# Patient Record
Sex: Female | Born: 1950 | Race: Black or African American | Hispanic: No | Smoking: Former smoker
Health system: Southern US, Community
[De-identification: ages and names within clinical notes are randomized; demographics above are authoritative.]

## PROBLEM LIST (undated history)

## (undated) DIAGNOSIS — D649 Anemia, unspecified: Secondary | ICD-10-CM

## (undated) DIAGNOSIS — E785 Hyperlipidemia, unspecified: Secondary | ICD-10-CM

## (undated) DIAGNOSIS — B1920 Unspecified viral hepatitis C without hepatic coma: Secondary | ICD-10-CM

## (undated) DIAGNOSIS — K219 Gastro-esophageal reflux disease without esophagitis: Secondary | ICD-10-CM

## (undated) DIAGNOSIS — R002 Palpitations: Secondary | ICD-10-CM

## (undated) DIAGNOSIS — I422 Other hypertrophic cardiomyopathy: Secondary | ICD-10-CM

## (undated) DIAGNOSIS — M6530 Trigger finger, unspecified finger: Secondary | ICD-10-CM

## (undated) DIAGNOSIS — T7840XA Allergy, unspecified, initial encounter: Secondary | ICD-10-CM

## (undated) DIAGNOSIS — K5790 Diverticulosis of intestine, part unspecified, without perforation or abscess without bleeding: Secondary | ICD-10-CM

## (undated) HISTORY — DX: Unspecified viral hepatitis C without hepatic coma: B19.20

## (undated) HISTORY — PX: COLONOSCOPY: SHX174

## (undated) HISTORY — DX: Diverticulosis of intestine, part unspecified, without perforation or abscess without bleeding: K57.90

## (undated) HISTORY — DX: Anemia, unspecified: D64.9

## (undated) HISTORY — PX: BREAST SURGERY: SHX581

## (undated) HISTORY — PX: UPPER GASTROINTESTINAL ENDOSCOPY: SHX188

## (undated) HISTORY — DX: Other hypertrophic cardiomyopathy: I42.2

## (undated) HISTORY — PX: FOOT GANGLION EXCISION: SHX1660

## (undated) HISTORY — DX: Palpitations: R00.2

## (undated) HISTORY — DX: Hyperlipidemia, unspecified: E78.5

## (undated) HISTORY — PX: POLYPECTOMY: SHX149

## (undated) HISTORY — DX: Allergy, unspecified, initial encounter: T78.40XA

---

## 1978-07-08 DIAGNOSIS — F1910 Other psychoactive substance abuse, uncomplicated: Secondary | ICD-10-CM | POA: Insufficient documentation

## 1978-07-08 HISTORY — PX: OOPHORECTOMY: SHX86

## 1978-07-08 HISTORY — PX: HEMORRHOID SURGERY: SHX153

## 1978-07-08 HISTORY — PX: OVARIAN CYST DRAINAGE: SHX325

## 1992-07-08 DIAGNOSIS — R42 Dizziness and giddiness: Secondary | ICD-10-CM | POA: Insufficient documentation

## 1994-07-08 HISTORY — PX: LACERATION REPAIR: SHX5168

## 1998-07-08 HISTORY — PX: OOPHORECTOMY: SHX86

## 1999-07-09 HISTORY — PX: BREAST REDUCTION SURGERY: SHX8

## 1999-10-09 HISTORY — PX: MYOMECTOMY: SHX85

## 2000-07-08 HISTORY — PX: PARTIAL HYSTERECTOMY: SHX80

## 2005-06-11 ENCOUNTER — Ambulatory Visit: Payer: Self-pay | Admitting: Family Medicine

## 2005-06-28 ENCOUNTER — Ambulatory Visit: Payer: Self-pay | Admitting: Family Medicine

## 2005-08-13 ENCOUNTER — Ambulatory Visit: Payer: Self-pay | Admitting: Family Medicine

## 2005-08-14 ENCOUNTER — Ambulatory Visit: Payer: Self-pay | Admitting: Family Medicine

## 2005-10-15 ENCOUNTER — Ambulatory Visit: Payer: Self-pay | Admitting: Family Medicine

## 2005-11-17 ENCOUNTER — Emergency Department: Payer: Self-pay | Admitting: Emergency Medicine

## 2005-11-18 ENCOUNTER — Ambulatory Visit: Payer: Self-pay | Admitting: Family Medicine

## 2005-11-21 ENCOUNTER — Ambulatory Visit: Payer: Self-pay | Admitting: Gastroenterology

## 2005-12-06 ENCOUNTER — Encounter: Payer: Self-pay | Admitting: Family Medicine

## 2005-12-06 DIAGNOSIS — R739 Hyperglycemia, unspecified: Secondary | ICD-10-CM | POA: Insufficient documentation

## 2005-12-06 DIAGNOSIS — I10 Essential (primary) hypertension: Secondary | ICD-10-CM | POA: Insufficient documentation

## 2005-12-06 LAB — CONVERTED CEMR LAB: Pap Smear: NORMAL

## 2005-12-12 ENCOUNTER — Ambulatory Visit: Payer: Self-pay | Admitting: Family Medicine

## 2005-12-17 ENCOUNTER — Other Ambulatory Visit: Admission: RE | Admit: 2005-12-17 | Discharge: 2005-12-17 | Payer: Self-pay | Admitting: Family Medicine

## 2005-12-17 ENCOUNTER — Ambulatory Visit: Payer: Self-pay | Admitting: Family Medicine

## 2005-12-17 ENCOUNTER — Encounter: Payer: Self-pay | Admitting: Family Medicine

## 2006-01-01 ENCOUNTER — Ambulatory Visit: Payer: Self-pay | Admitting: Internal Medicine

## 2006-02-25 ENCOUNTER — Ambulatory Visit: Payer: Self-pay | Admitting: Family Medicine

## 2006-05-22 ENCOUNTER — Ambulatory Visit: Payer: Self-pay | Admitting: Family Medicine

## 2006-06-09 ENCOUNTER — Ambulatory Visit: Payer: Self-pay | Admitting: Gastroenterology

## 2006-06-16 ENCOUNTER — Ambulatory Visit: Payer: Self-pay | Admitting: Family Medicine

## 2006-10-07 ENCOUNTER — Encounter: Payer: Self-pay | Admitting: Family Medicine

## 2006-10-07 DIAGNOSIS — E7800 Pure hypercholesterolemia, unspecified: Secondary | ICD-10-CM | POA: Insufficient documentation

## 2006-10-22 ENCOUNTER — Ambulatory Visit: Payer: Self-pay | Admitting: Family Medicine

## 2006-12-10 ENCOUNTER — Ambulatory Visit: Payer: Self-pay | Admitting: Gastroenterology

## 2007-01-28 ENCOUNTER — Encounter (INDEPENDENT_AMBULATORY_CARE_PROVIDER_SITE_OTHER): Payer: Self-pay | Admitting: *Deleted

## 2007-01-29 ENCOUNTER — Ambulatory Visit: Payer: Self-pay | Admitting: Family Medicine

## 2007-01-29 LAB — CONVERTED CEMR LAB
ALT: 66 units/L — ABNORMAL HIGH (ref 0–35)
AST: 56 units/L — ABNORMAL HIGH (ref 0–37)
Albumin: 4 g/dL (ref 3.5–5.2)
Alkaline Phosphatase: 68 units/L (ref 39–117)
Bilirubin, Direct: 0.1 mg/dL (ref 0.0–0.3)
Calcium: 9.7 mg/dL (ref 8.4–10.5)
Chloride: 103 meq/L (ref 96–112)
Cholesterol: 174 mg/dL (ref 0–200)
GFR calc non Af Amer: 92 mL/min
Glucose, Bld: 109 mg/dL — ABNORMAL HIGH (ref 70–99)
LDL Cholesterol: 125 mg/dL — ABNORMAL HIGH (ref 0–99)
TSH: 0.54 microintl units/mL (ref 0.35–5.50)

## 2007-02-02 ENCOUNTER — Encounter: Payer: Self-pay | Admitting: Family Medicine

## 2007-02-02 ENCOUNTER — Ambulatory Visit: Payer: Self-pay | Admitting: Family Medicine

## 2007-02-03 ENCOUNTER — Encounter: Admission: RE | Admit: 2007-02-03 | Discharge: 2007-02-03 | Payer: Self-pay | Admitting: Family Medicine

## 2007-02-20 ENCOUNTER — Ambulatory Visit: Payer: Self-pay | Admitting: Family Medicine

## 2007-02-23 ENCOUNTER — Encounter: Admission: RE | Admit: 2007-02-23 | Discharge: 2007-02-23 | Payer: Self-pay | Admitting: Family Medicine

## 2007-02-24 ENCOUNTER — Ambulatory Visit: Payer: Self-pay | Admitting: Family Medicine

## 2007-02-25 ENCOUNTER — Telehealth: Payer: Self-pay | Admitting: Family Medicine

## 2007-03-16 ENCOUNTER — Ambulatory Visit: Payer: Self-pay | Admitting: Gastroenterology

## 2007-03-16 ENCOUNTER — Encounter: Payer: Self-pay | Admitting: Family Medicine

## 2007-03-19 ENCOUNTER — Ambulatory Visit: Payer: Self-pay | Admitting: Family Medicine

## 2007-03-20 ENCOUNTER — Encounter (INDEPENDENT_AMBULATORY_CARE_PROVIDER_SITE_OTHER): Payer: Self-pay | Admitting: *Deleted

## 2007-07-24 ENCOUNTER — Telehealth: Payer: Self-pay | Admitting: Family Medicine

## 2007-08-03 ENCOUNTER — Ambulatory Visit: Payer: Self-pay | Admitting: Family Medicine

## 2007-08-03 DIAGNOSIS — R002 Palpitations: Secondary | ICD-10-CM

## 2007-08-04 ENCOUNTER — Ambulatory Visit: Payer: Self-pay | Admitting: Cardiology

## 2007-08-11 ENCOUNTER — Ambulatory Visit: Payer: Self-pay | Admitting: Family Medicine

## 2007-08-12 ENCOUNTER — Encounter: Admission: RE | Admit: 2007-08-12 | Discharge: 2007-08-12 | Payer: Self-pay | Admitting: Family Medicine

## 2007-08-12 ENCOUNTER — Ambulatory Visit: Payer: Self-pay | Admitting: Cardiology

## 2007-08-12 ENCOUNTER — Encounter: Payer: Self-pay | Admitting: Cardiology

## 2007-08-28 ENCOUNTER — Ambulatory Visit: Payer: Self-pay

## 2007-08-28 ENCOUNTER — Ambulatory Visit: Payer: Self-pay | Admitting: Cardiology

## 2008-03-01 ENCOUNTER — Encounter: Admission: RE | Admit: 2008-03-01 | Discharge: 2008-03-01 | Payer: Self-pay | Admitting: Family Medicine

## 2008-03-02 ENCOUNTER — Encounter (INDEPENDENT_AMBULATORY_CARE_PROVIDER_SITE_OTHER): Payer: Self-pay | Admitting: *Deleted

## 2008-03-02 ENCOUNTER — Ambulatory Visit: Payer: Self-pay | Admitting: Cardiology

## 2008-04-21 ENCOUNTER — Encounter: Payer: Self-pay | Admitting: Family Medicine

## 2008-04-21 ENCOUNTER — Other Ambulatory Visit: Admission: RE | Admit: 2008-04-21 | Discharge: 2008-04-21 | Payer: Self-pay | Admitting: Family Medicine

## 2008-04-21 ENCOUNTER — Ambulatory Visit: Payer: Self-pay | Admitting: Family Medicine

## 2008-04-21 LAB — CONVERTED CEMR LAB
AST: 38 units/L — ABNORMAL HIGH (ref 0–37)
Alkaline Phosphatase: 72 units/L (ref 39–117)
Basophils Absolute: 0 10*3/uL (ref 0.0–0.1)
Chloride: 105 meq/L (ref 96–112)
Cholesterol: 159 mg/dL (ref 0–200)
Creatinine,U: 81 mg/dL
Eosinophils Absolute: 0.1 10*3/uL (ref 0.0–0.7)
GFR calc Af Amer: 132 mL/min
GFR calc non Af Amer: 109 mL/min
HDL: 36.2 mg/dL — ABNORMAL LOW (ref >39.0)
MCHC: 33.9 g/dL (ref 30.0–36.0)
MCV: 86 fL (ref 78.0–100.0)
Microalb Creat Ratio: 7.4 mg/g (ref 0.0–30.0)
Monocytes Absolute: 0.3 10*3/uL (ref 0.1–1.0)
Neutrophils Relative %: 41.7 % — ABNORMAL LOW (ref 43.0–77.0)
Platelets: 202 10*3/uL (ref 150–400)
Potassium: 4.2 meq/L (ref 3.5–5.1)
RDW: 12.7 % (ref 11.5–14.6)
TSH: 0.91 microintl units/mL (ref 0.35–5.50)
Total Bilirubin: 0.9 mg/dL (ref 0.3–1.2)
Triglycerides: 100 mg/dL (ref 0–149)
VLDL: 20 mg/dL (ref 0–40)

## 2008-04-26 ENCOUNTER — Encounter (INDEPENDENT_AMBULATORY_CARE_PROVIDER_SITE_OTHER): Payer: Self-pay | Admitting: *Deleted

## 2008-05-04 ENCOUNTER — Ambulatory Visit: Payer: Self-pay | Admitting: Family Medicine

## 2008-05-04 ENCOUNTER — Encounter (INDEPENDENT_AMBULATORY_CARE_PROVIDER_SITE_OTHER): Payer: Self-pay | Admitting: *Deleted

## 2008-05-04 LAB — CONVERTED CEMR LAB
OCCULT 1: NEGATIVE
OCCULT 3: NEGATIVE

## 2008-09-19 ENCOUNTER — Ambulatory Visit: Payer: Self-pay | Admitting: Family Medicine

## 2008-09-19 DIAGNOSIS — R209 Unspecified disturbances of skin sensation: Secondary | ICD-10-CM

## 2008-10-24 ENCOUNTER — Ambulatory Visit: Payer: Self-pay | Admitting: Family Medicine

## 2008-10-24 DIAGNOSIS — M545 Low back pain: Secondary | ICD-10-CM

## 2008-10-24 DIAGNOSIS — K644 Residual hemorrhoidal skin tags: Secondary | ICD-10-CM

## 2008-10-27 ENCOUNTER — Encounter: Payer: Self-pay | Admitting: Family Medicine

## 2008-10-27 ENCOUNTER — Telehealth: Payer: Self-pay | Admitting: Family Medicine

## 2009-01-25 ENCOUNTER — Encounter (INDEPENDENT_AMBULATORY_CARE_PROVIDER_SITE_OTHER): Payer: Self-pay | Admitting: *Deleted

## 2009-03-02 ENCOUNTER — Ambulatory Visit: Payer: Self-pay | Admitting: Family Medicine

## 2009-03-02 DIAGNOSIS — K5732 Diverticulitis of large intestine without perforation or abscess without bleeding: Secondary | ICD-10-CM

## 2009-03-02 LAB — CONVERTED CEMR LAB
Glucose, Urine, Semiquant: NEGATIVE
Nitrite: NEGATIVE
Protein, U semiquant: NEGATIVE
Specific Gravity, Urine: 1.01
WBC Urine, dipstick: NEGATIVE
pH: 5

## 2009-03-06 ENCOUNTER — Telehealth: Payer: Self-pay | Admitting: Family Medicine

## 2009-05-02 ENCOUNTER — Telehealth: Payer: Self-pay | Admitting: Cardiology

## 2009-05-03 ENCOUNTER — Ambulatory Visit: Payer: Self-pay | Admitting: Cardiology

## 2009-05-03 DIAGNOSIS — I517 Cardiomegaly: Secondary | ICD-10-CM | POA: Insufficient documentation

## 2009-05-10 ENCOUNTER — Ambulatory Visit: Payer: Self-pay | Admitting: Family Medicine

## 2009-05-10 DIAGNOSIS — T7840XA Allergy, unspecified, initial encounter: Secondary | ICD-10-CM | POA: Insufficient documentation

## 2009-06-19 ENCOUNTER — Encounter: Admission: RE | Admit: 2009-06-19 | Discharge: 2009-06-19 | Payer: Self-pay | Admitting: Family Medicine

## 2009-08-24 ENCOUNTER — Telehealth (INDEPENDENT_AMBULATORY_CARE_PROVIDER_SITE_OTHER): Payer: Self-pay | Admitting: *Deleted

## 2009-09-13 ENCOUNTER — Ambulatory Visit: Payer: Self-pay | Admitting: Family Medicine

## 2009-09-13 DIAGNOSIS — R259 Unspecified abnormal involuntary movements: Secondary | ICD-10-CM | POA: Insufficient documentation

## 2009-11-06 ENCOUNTER — Ambulatory Visit: Payer: Self-pay | Admitting: Family Medicine

## 2009-11-06 DIAGNOSIS — J069 Acute upper respiratory infection, unspecified: Secondary | ICD-10-CM | POA: Insufficient documentation

## 2010-02-13 ENCOUNTER — Encounter (INDEPENDENT_AMBULATORY_CARE_PROVIDER_SITE_OTHER): Payer: Self-pay | Admitting: *Deleted

## 2010-04-13 ENCOUNTER — Ambulatory Visit: Payer: Self-pay | Admitting: Family Medicine

## 2010-04-13 DIAGNOSIS — H1045 Other chronic allergic conjunctivitis: Secondary | ICD-10-CM | POA: Insufficient documentation

## 2010-04-23 ENCOUNTER — Telehealth (INDEPENDENT_AMBULATORY_CARE_PROVIDER_SITE_OTHER): Payer: Self-pay

## 2010-06-15 ENCOUNTER — Encounter (INDEPENDENT_AMBULATORY_CARE_PROVIDER_SITE_OTHER): Payer: Self-pay | Admitting: *Deleted

## 2010-06-29 ENCOUNTER — Ambulatory Visit: Payer: Self-pay | Admitting: Physician Assistant

## 2010-07-19 ENCOUNTER — Encounter (INDEPENDENT_AMBULATORY_CARE_PROVIDER_SITE_OTHER): Payer: Self-pay | Admitting: *Deleted

## 2010-07-20 ENCOUNTER — Ambulatory Visit
Admission: RE | Admit: 2010-07-20 | Discharge: 2010-07-20 | Payer: Self-pay | Source: Home / Self Care | Attending: Internal Medicine | Admitting: Internal Medicine

## 2010-07-29 ENCOUNTER — Encounter: Payer: Self-pay | Admitting: Family Medicine

## 2010-08-03 ENCOUNTER — Ambulatory Visit
Admission: RE | Admit: 2010-08-03 | Discharge: 2010-08-03 | Payer: Self-pay | Source: Home / Self Care | Attending: Internal Medicine | Admitting: Internal Medicine

## 2010-08-03 ENCOUNTER — Encounter: Payer: Self-pay | Admitting: Internal Medicine

## 2010-08-03 LAB — HM COLONOSCOPY

## 2010-08-07 NOTE — Assessment & Plan Note (Signed)
Summary: SPASMS IN NECK   Vital Signs:  Patient profile:   60 year old female Weight:      190.75 pounds Temp:     98.5 degrees F oral Pulse rate:   72 / minute Pulse rhythm:   regular BP sitting:   128 / 82  (left arm) Cuff size:   regular  Vitals Entered By: Sydell Axon LPN (September 13, 1608 10:50 AM) CC: Spasms in left side of neck   History of Present Illness: [name] is a 60 y/o [place] American female who presents with a 2 day history of episodic spasms in her left neck.  She describes the spasms as "twitches" that last for a few seconds.  They occur randomly and she doesn't recall anything that initiates the twitching.  Likewise, they resolve spontaneously.  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  Her only other medical complaint is the inability to maintain weight loss.  She states that she fluctuates between gaining an dlosing 10 lbs.  Her weight loss regimen consists of walking 2 miles every morning but she professes to eating too much and to having too many snacks.  She would like to be more dedicated.    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 Take One By Mouth Once A Day 2)  Benadryl 25 Mg Caps (Diphenhydramine Hcl) .... As Needed  Allergies: 1)  Penicillin G Potassium (Penicillin G Potassium) 2)  Morphine Sulfate (Morphine Sulfate)  Physical Exam  General:  Well-developed,well-nourished,in no acute distress; alert,appropriate and cooperative throughout examination Head:  Normocephalic and atraumatic without obvious abnormalities. No apparent alopecia or balding. Eyes:  Conjunctiva clear bilaterally. Noninflamed. Ears:  External ear exam shows no significant lesions or deformities.  Otoscopic examination reveals clear canals, tympanic membranes are intact bilaterally without bulging, retraction, inflammation or discharge. Hearing is grossly normal bilaterally. Nose:  External nasal examination shows no deformity or inflammation. Nasal mucosa are pink and moist without lesions or exudates. Mouth:  Oral mucosa and oropharynx without lesions or exudates.  Teeth in good repair. Neck:  No deformities, masses, or tenderness noted.  No carotid bruits or JVD noted.  Carotid pulses 2+ with normal rate. Lungs:  Normal respiratory effort, chest expands symmetrically. Lungs are clear to auscultation, no crackles or wheezes. Heart:  Normal rate and regular rhythm. S1 and S2 normal without gallop, murmur, click, rub or other extra sounds. Msk:  Occas twitching of left neck muscles.   Impression & Recommendations:  Problem # 1:  MUSCLE FASCICULATIONS (ICD-781.0) Assessment New Take 2 Tbsp mustard regularly. Move computer screen to horizontal with eyes.  Problem # 2:  PALPITATIONS (ICD-785.1) Assessment: Unchanged  Stable, NSR today.  Avoid caffeine, chocolate, and stimulants. Stress reduction as well as medication options discussed.   Problem # 3:  HYPERTENSION (ICD-401.9) Assessment: Unchanged Stable. Her updated medication list for this problem includes:    Verapamil  Hcl Cr 240 Mg Tbcr (Verapamil hcl) .Marland Kitchen... Take one by mouth once a day  BP today: 128/82 Prior BP: 114/80 (05/10/2009)  Labs Reviewed: K+: 4.2 (04/21/2008) Creat: : 0.6 (04/21/2008)   Chol: 159 (04/21/2008)   HDL: 36.2 (04/21/2008)   LDL: 103 (04/21/2008)   TG: 100 (04/21/2008)  Complete Medication List: 1)  Verapamil Hcl Cr 240 Mg Tbcr (Verapamil hcl) .... Take one by mouth once a day 2)  Benadryl 25 Mg Caps (Diphenhydramine hcl) .... As needed  Patient Instructions: 1)  RTC if sxs cont.  Current Allergies (reviewed today): PENICILLIN G POTASSIUM (PENICILLIN G POTASSIUM) MORPHINE SULFATE (MORPHINE SULFATE)

## 2010-08-07 NOTE — Letter (Signed)
Summary: Pre Visit Letter Revised  [facility]  [address] [place], [place] [address]   Phone: [phone]  Fax: [phone]        06/15/2010 MRN: [id number] [name] [address] [place], [place]  [address]             Procedure Date:  08/03/2010   Welcome to the Gastroenterology Division at [facility].    You are scheduled to see a nurse for your pre-procedure visit on 07/20/2010 at 11:00AM on the 3rd floor at [facility], [address]  We ask that you try to arrive at our office 15 minutes prior to your appointment time to allow for check-in.  Please take a minute to review the attached form.  If you answer "Yes" to one or more of the questions on the first page, we ask that you call the person listed at your earliest opportunity.  If you answer "No" to all of the questions, please complete the rest of the form and bring it to your appointment.    Your nurse visit will consist of discussing your medical and surgical history, your immediate family medical history, and your medications.   If you are unable to list all of your medications on the form, please bring the medication bottles to your appointment and we will list them.  We will need to be aware of both prescribed and over the counter drugs.  We will need to know exact dosage information as well.    Please be prepared to read and sign documents such as consent forms, a financial agreement, and acknowledgement forms.  If necessary, and with your consent, a friend or relative is welcome to sit-in on the nurse visit with you.  Please bring your insurance card so that we may make a copy of it.  If your insurance requires a referral to see a specialist, please bring your referral form from your primary care physician.  No co-pay is required for this nurse visit.     If you cannot keep your appointment, please call [phone] to cancel or reschedule prior to your appointment date.  This  allows [place] the opportunity to schedule an appointment for another patient in need of care.    Thank you for choosing [facility] for your medical needs.  We appreciate the opportunity to care for you.  Please visit [place] at our website  to learn more about our practice.  Sincerely, The Gastroenterology Division

## 2010-08-07 NOTE — Progress Notes (Signed)
   Pt signed ROI, Selmer [facility]ion out records to her 08/23/09 [facility]  August 24, 2009 10:38 AM

## 2010-08-07 NOTE — Letter (Signed)
Summary: [name] letter  [place] [facility]  [address] [place], [place] [address]   Phone: [phone]  Fax: [phone]       02/13/2010 MRN: [id number]  [name] [address] [place], [place]  [address]  Dear Ms. [name] [place], and [facility] announce the retirement of Arta Silence, M.D., from full-time practice at the [facility] office effective January 04, 2010 and his plans of returning part-time.  It is important to Dr. Hetty Ely and to our practice that you understand that [facility] has seven physicians in our office for your health care needs.  We will continue to offer the same exceptional care that you have today.    Dr. Hetty Ely has spoken to many of you about his plans for retirement and returning part-time in the fall.   We will continue to work with you through the transition to schedule appointments for you in the office and meet the high standards that [place] is committed to.   Again, it is with great pleasure that we share the news that Dr. Hetty Ely will return to [facility] in October of 2011 with a reduced schedule.    If you have any questions, or would like to request an appointment with one of our physicians, please call us at [phone] and press the option for Scheduling an appointment.  We take pleasure in providing you with excellent patient care and look forward to seeing you at your next office visit.  Our [facility] Physicians are:  Tillman Abide, M.D. Laurita Quint, M.D. Roxy Manns, M.D. Kerby Nora, M.D. Hannah Beat, M.D. Ruthe Mannan, M.D. We proudly welcomed Raechel Ache, M.D. and Eustaquio Boyden, M.D. to the practice in July/August 2011.  Sincerely,  [facility]

## 2010-08-07 NOTE — Progress Notes (Signed)
   Phone Note Outgoing Call   Summary of Call: pt was no show today; reached her by phone and she stated she had cancelled this appt and told "her"/whoever she spoke with that she would call and reschedule at a later date. Initial call taken by: Doristine Church RN II,  April 23, 2010 4:27 PM

## 2010-08-07 NOTE — Assessment & Plan Note (Signed)
Summary: ? BRONCHITIS/NT   Vital Signs:  Patient profile:   60 year old female Weight:      189.50 pounds Temp:     98.1 degrees F oral Pulse rate:   80 / minute Pulse rhythm:   regular BP sitting:   110 / 70  (left arm) Cuff size:   regular  Vitals Entered By: Sydell Axon LPN (Nov 06, 1608 12:31 PM) CC: Productive cough/greenish   History of Present Illness: Pt here for heaviness in chest and pain with coughing last Mon, one week ago. She used Raliegh's Camphor rub on the chest and the mucous has started coming up but she wakes up coughing at night. She has not had fever or chills, mild headache with a "hum in her head" generally. No ear pain. no rhinitis, No nasal congestion. Mild ST from coughing and cough productive of yellowish/green mucous.  She has taken Mucinex and used the salve.  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 Take One By Mouth Once A Day 2)  Benadryl 25 Mg Caps (Diphenhydramine Hcl) .... As Needed  Allergies: 1)  Penicillin G Potassium (Penicillin G Potassium) 2)  Morphine Sulfate (Morphine  Sulfate)  Physical Exam  General:  Well-developed,well-nourished,in no acute distress; alert,appropriate and cooperative throughout examination Head:  Normocephalic and atraumatic without obvious abnormalities. No apparent alopecia or balding. Sinuses NT. Eyes:  Conjunctiva clear bilaterally. Noninflamed. Ears:  External ear exam shows no significant lesions or deformities.  Otoscopic examination reveals clear canals, tympanic membranes are intact bilaterally without bulging, retraction, inflammation or discharge. Hearing is grossly normal bilaterally. Nose:  External nasal examination shows no deformity or inflammation. Nasal mucosa are pink and moist without lesions or exudates. Mouth:  Oral mucosa and oropharynx without lesions or exudates.  Teeth in good repair. Neck:  No deformities, masses, or tenderness noted.  No carotid bruits or JVD noted.  Carotid pulses 2+ with normal rate. Chest Wall:  No deformities, masses, or tenderness noted. Lungs:  Normal respiratory effort, chest expands symmetrically. Lungs are clear to auscultation, no crackles or wheezes. Heart:  Normal rate and regular rhythm. S1 and S2 normal without gallop, murmur, click, rub or other extra sounds.   Impression & Recommendations:  Problem # 1:  URI (ICD-465.9) Assessment New  See instructions. Her updated medication list for this problem includes:    Benadryl 25 Mg Caps (Diphenhydramine hcl) .Marland Kitchen... As needed    Tessalon 200 Mg Caps (Benzonatate) ..... One tab by mouth three times a day as needed for cough.  Instructed on symptomatic treatment. Call if symptoms persist or worsen.   Complete Medication List: 1)  Verapamil Hcl Cr 240 Mg  Tbcr (Verapamil hcl) .... Take one by mouth once a day 2)  Benadryl 25 Mg Caps (Diphenhydramine hcl) .... As needed 3)  Tessalon 200 Mg Caps (Benzonatate) .... One tab by mouth three times a day as needed for cough. 4)  Zithromax Z-pak 250 Mg Tabs (Azithromycin) .... As  dir  Patient Instructions: 1)  Take Guaifenesin by [facility] or [facility] and getting MUCOUS RELIEF EXPECTORANT (400mg ), take 11/2 tabs by mouth AM and NOON. 2)  Drink lots of fluids anytime taking Guaifenesin.  3)  Take Tyl ES 2 tabs by mouth three times a day. 4)  Take Tessalon for cough three times a day, at least at night for a while. 5)  Zithro if needed by Fri. for sxs not improved Prescriptions: ZITHROMAX Z-PAK 250 MG TABS (AZITHROMYCIN) as dir  #1 pak x 0   Entered and Authorized by:   Shaune Leeks MD   Signed by:   Shaune Leeks MD on 11/06/2009   Method used:   Print then Give to Patient   RxID:   [id number] TESSALON 200 MG CAPS (BENZONATATE) one tab by mouth three times a day as needed for cough.  #40 x 1   Entered and Authorized by:   Shaune Leeks MD   Signed by:   Shaune Leeks MD on 11/06/2009   Method used:   [facility]        [facility]  [address] (retail)       [address]       [place], [place]  [address]       Ph: [phone] or [phone]       Fax: [phone]   RxID:   [id number]   Current Allergies (reviewed today): PENICILLIN G POTASSIUM (PENICILLIN G POTASSIUM) MORPHINE SULFATE (MORPHINE SULFATE)

## 2010-08-07 NOTE — Assessment & Plan Note (Signed)
Summary: SWOLLEN EYE/CLE   Vital Signs:  Patient profile:   60 year old female Height:      65 inches Weight:      191.4 pounds BMI:     31.97 Temp:     98.1 degrees F oral Pulse rate:   80 / minute Pulse rhythm:   regular BP sitting:   130 / 76  (left arm) Cuff size:   regular  Vitals Entered ByMelody Comas (April 13, 2010 3:36 PM) CC: swollen eye    History of Present Illness: Mowed yard Saturday.  R eye was tearing Sunday, but that got better. L eye was swollen on Monday/Tuesday.  Not painful.  No change in vision except for lid edema.  No FB feeling.  H/o allergies.  No FCNAV or other symptoms.  No ear pain.    has follow up wtih cards, needs refill on CCB.  Feeling well.   Allergies: 1)  Penicillin G Potassium (Penicillin G Potassium) 2)  Morphine Sulfate (Morphine Sulfate)  Review of Systems       See HPI.  Otherwise negative.    Physical Exam  General:  NCAT RRR CTAB Eyes:  No corneal or conjunctival inflammation noted on L. EOMI. Perrla. Funduscopic exam benign, without hemorrhages, exudates or papilledema. Vision grossly normal. R conjunctiva slightly injected.  R upper and lower lid midly injected and edematous but no tender to palpation.  no stye on exam.  no fluctuant mass.  Nose:  External nasal examination shows no deformity or inflammation. Nasal mucosa are pink and moist without lesions or exudates. Mouth:  Oral mucosa and oropharynx without lesions or exudates.   Impression & Recommendations:  Problem # 1:  CONJUNCTIVITIS, ALLERGIC, SEASONAL (ICD-372.14) No indication that this is infectious.  Likely asymmetric allergic sensitivity.  Use otc claritin and follow up as needed.  This should gradually improve.  benign exam o/w.   Problem # 2:  SCREENING FOR MALIGNANNT NEOPLASM, SITE NEC (ICD-V76.49) Needs referral.  Orders: Gastroenterology Referral (GI)  Complete Medication List: 1)  Verapamil Hcl Cr 240 Mg Tbcr (Verapamil hcl) .... Take one  by mouth once a day  Patient Instructions: 1)  This should gradually get better.  Take claritin instead of the benadryl.  Call the cardiology clinic about going to see them.  Prescriptions: VERAPAMIL HCL CR 240 MG TBCR (VERAPAMIL HCL) Take one by mouth once a day  #20 x 12   Entered and Authorized by:   Graham Duncan MD   Signed by:   Graham Duncan MD on 04/13/2010   Method used:   [facility]        [id number]       [address]       [place], [place]  [address]       Ph: [phone] or [phone]       Fax: [phone]   RxID:   [id number]   Current Allergies (reviewed today): PENICILLIN G POTASSIUM (PENICILLIN G POTASSIUM) MORPHINE SULFATE (MORPHINE SULFATE)

## 2010-08-09 NOTE — Miscellaneous (Signed)
Summary: [facility] Previsit/prep  Clinical Lists Changes  Medications: Added new medication of DULCOLAX 5 MG  TBEC (BISACODYL) Day before procedure take 2 at 3pm and 2 at 8pm. - Signed Added new medication of METOCLOPRAMIDE HCL 10 MG  TABS (METOCLOPRAMIDE HCL) As per prep instructions. - Signed Added new medication of MIRALAX   POWD (POLYETHYLENE GLYCOL 3350) As per prep  instructions. - Signed Rx of DULCOLAX 5 MG  TBEC (BISACODYL) Day before procedure take 2 at 3pm and 2 at 8pm.;  #4 x 0;  Signed;  Entered by: Wyona Almas RN;  Authorized by: Hart Carwin MD;  Method used: [facility]  [place], [place], [place]  [address], Ph: [phone] or [phone], Fax: [phone] Rx of METOCLOPRAMIDE HCL 10 MG  TABS (METOCLOPRAMIDE HCL) As per prep instructions.;  #2 x 0;  Signed;  Entered by: Wyona Almas RN;  Authorized by: Hart Carwin MD;  Method used: Electronically to [facility]  [address], [place], [place]  [address], Ph: [phone] or [phone], Fax: [phone] Rx of MIRALAX   POWD (POLYETHYLENE GLYCOL 3350) As per prep  instructions.;  #255gm x 0;  Signed;  Entered by: Wyona Almas RN;  Authorized by: Hart Carwin MD;  Method used: Electronically [facility]  [place] [place], [place]  [address], Ph: [phone] or [phone], Fax: [phone] Observations: Added new observation of ALLERGY REV: Done (07/20/2010 10:44)    Prescriptions: MIRALAX   POWD (POLYETHYLENE GLYCOL 3350) As per prep  instructions.  #255gm x 0   Entered by:   Wyona Almas RN   Authorized by:   Hart Carwin MD   Signed by:   Wyona Almas RN on 07/20/2010   Method used:   Electronically to        [facility]  [address] (retail)       [address]       [place], [place]  [address]       Ph: [phone] or [phone]       Fax: [phone]   RxID:   [id number] METOCLOPRAMIDE HCL 10 MG  TABS  (METOCLOPRAMIDE HCL) As per prep instructions.  #2 x 0   Entered by:   Wyona Almas RN   Authorized by:   Hart Carwin MD   Signed by:   Wyona Almas RN on 07/20/2010   Method used:   Electronically to        [facility]  [address]* (retail)       [address]       [place], [place]  [address]       Ph: [phone] or [phone]       Fax: [phone]   RxID:   [id number] DULCOLAX 5 MG  TBEC (BISACODYL) Day before procedure take 2 at 3pm and 2 at 8pm.  #4 x 0   Entered by:   Wyona Almas RN   Authorized by:   Hart Carwin MD   Signed by:   Wyona Almas RN on 07/20/2010   Method used:   Electronically to        [facility]  [address]* (retail)       [address]       [place], [place]  [address]       Ph: [phone] or [phone]       Fax: [phone]   RxID:   [id number]

## 2010-08-09 NOTE — Procedures (Signed)
Summary: Colonoscopy  Patient: [name] Note: All result statuses are Final unless otherwise noted.  Tests: (1) Colonoscopy (COL)   COL Colonoscopy           DONE     [facility]     [address]     [place], [place]  [address]           COLONOSCOPY PROCEDURE REPORT           PATIENT:  [name]  MR#:  [id number]     BIRTHDATE:  11-11-1950, 59 yrs. old  GENDER:  female     ENDOSCOPIST:  Hedwig Morton. Juanda Chance, MD     REF. BY:  Laurita Quint, M.D.     PROCEDURE DATE:  08/03/2010     PROCEDURE:  Colonoscopy 21308     ASA CLASS:  Class II     INDICATIONS:  family history of colon cancer father and paternal     aunt with colon cancer     MEDICATIONS:   Versed 8 mg, Fentanyl 75 mcg           DESCRIPTION OF PROCEDURE:   After the risks benefits and     alternatives of the procedure were thoroughly explained, informed     consent was obtained.  Digital rectal exam was performed and     revealed no rectal masses.   The LB [id number]-[id number] endoscope     was introduced through the anus and advanced to the cecum, which     was identified by both the appendix and ileocecal valve, without     limitations.  The quality of the prep was good, using MiraLax.     The instrument was then slowly withdrawn as the colon was fully     examined.     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  This was     otherwise a normal examination of the colon (see image2 and     image1).  Internal hemorrhoids were found (see image4).     Retroflexed views in the rectum revealed no abnormalities.    The     scope was then withdrawn from the patient and the procedure     completed.           COMPLICATIONS:  None     ENDOSCOPIC IMPRESSION:     1) Melanosis     2) Otherwise normal examination     3) Internal hemorrhoids     RECOMMENDATIONS:     1) high fiber diet     REPEAT EXAM:  In 5 year(s) for.           ______________________________     Hedwig Morton. Juanda Chance, MD           CC:           n.     eSIGNED:   Hedwig Morton. Zinedine Ellner at 08/03/2010 11:43 AM           [name], [id number]  Note: An exclamation mark (!) indicates a result that was not dispersed into the flowsheet. Document Creation Date: 08/03/2010 11:43 AM _______________________________________________________________________  (1) Order result status: Final Collection or observation date-time: 08/03/2010 11:38 Requested date-time:  Receipt date-time:  Reported date-time:  Referring Physician:   Ordering Physician: [name] [phone]) Specimen Source:  Source: Launa Grill Order Number: [id number] Lab site:   Appended Document: Colonoscopy  Clinical Lists Changes  Observations: Added new observation of COLONNXTDUE: 07/2015 (08/03/2010 13:19)      Appended Document: Colonoscopy     Clinical Lists Changes  Observations: Added new observation of PAST SURG HX: OOPHORECTOMY RIGHT 1980 HOLTER 22 HRS - SLOW RHYTYHM 54-116  - 3 ECTOPICS  RARE SVRR VEIN 02/06/00 ECHO - MILD LVH TR, MI, TI 04/20/01 ETI  MYOVIEW NML - EF 72% 02/09/03 COLONOSCOPY NML (SIGMF H/O COLON CA FATHER/AUNT, SISTER WITH POLYPS) 08/10/98 PELVIC US - FIBROIDS X 2 10/09/99 COLONOSCOPY NML - 5/05 EGD MICROSCOPICALLY ABNORMAL  - ESOPH AND GASTRIC BX'S 06/09/06 NSVD x 2 RUPTURED OVARIAN CYST 1980 PARTIAL HYST WITH NO  REMAINING  OVARY 2002 GANGLIONECTOMY,R FOOT 14YOA THUMB REPAIR R LAC 1996 BREAST REDUCTION,2001 COLONOSCOPY DIVERTUICS SM INT HEMMS (DR SKULSKIE) 03/16/2007 Colonoscopy Melanosis Coli Int Hemms  (Dr Juanda Chance) 08/03/2010          5 yrs (08/03/2010 14:09)       Past Surgical History:    OOPHORECTOMY RIGHT 1980    HOLTER 22 HRS - SLOW RHYTYHM 54-116  - 3 ECTOPICS  RARE SVRR VEIN 02/06/00    ECHO - MILD LVH TR, MI, TI 04/20/01    ETI  MYOVIEW NML - EF 72% 02/09/03    COLONOSCOPY NML (SIGMF H/O COLON CA FATHER/AUNT, SISTER WITH POLYPS) 08/10/98    PELVIC US - FIBROIDS X 2 10/09/99     COLONOSCOPY NML - 5/05    EGD MICROSCOPICALLY ABNORMAL  - ESOPH AND GASTRIC BX'S 06/09/06    NSVD x 2    RUPTURED OVARIAN CYST 1980    PARTIAL HYST WITH NO  REMAINING  OVARY 2002    GANGLIONECTOMY,R FOOT 14YOA    THUMB REPAIR R LAC 1996    BREAST REDUCTION,2001    COLONOSCOPY DIVERTUICS SM INT HEMMS (DR SKULSKIE) 03/16/2007    Colonoscopy Melanosis Coli Int Hemms  (Dr Juanda Chance) 08/03/2010          3 yrs

## 2010-08-09 NOTE — Letter (Signed)
Summary: [facility] Instructions  [facility]  [address] [place], [place] [address]   Phone: [phone]  Fax: [phone]       [name]    Apr 05, 1951    MRN: [id number]       Procedure Day Dorna Bloom:  Farrell Ours  08/03/10     Arrival Time: 10:00AM     Procedure Time:  11:00AM     Location of Procedure:                    [facility] _  [facility] (4th Floor)   PREPARATION FOR COLONOSCOPY WITH MIRALAX  Starting 5 days prior to your procedure 07/29/10 do not eat nuts, seeds, popcorn, corn, beans, peas,  salads, or any raw vegetables.  Do not take any fiber supplements (e.g. Metamucil, Citrucel, and Benefiber). ____________________________________________________________________________________________________   THE DAY BEFORE YOUR PROCEDURE         DATE: 08/02/10  DAY: THURSDAY  1   Drink clear liquids the entire day-NO SOLID FOOD  2   Do not drink anything colored red or purple.  Avoid juices with pulp.  No orange juice.  3   Drink at least 64 oz. (8 glasses) of fluid/clear liquids during the day to prevent dehydration and help the prep work efficiently.  CLEAR LIQUIDS INCLUDE: Water Jello Ice Popsicles Tea (sugar ok, no milk/cream) Powdered fruit flavored drinks Coffee (sugar ok, no milk/cream) Gatorade Juice: apple, white grape, white cranberry  Lemonade Clear bullion, consomm, broth Carbonated beverages (any kind) Strained chicken noodle soup Hard Candy  4   Mix the entire bottle of Miralax with 64 oz. of Gatorade/Powerade in the morning and put in the refrigerator to chill.  5   At 3:00 pm take 2 Dulcolax/Bisacodyl tablets.  6   At 4:30 pm take one Reglan/Metoclopramide tablet.  7  Starting at 5:00 pm drink one 8 oz glass of the Miralax mixture every 15-20 minutes until you have finished drinking the entire 64 oz.  You should finish drinking prep around 7:30 or 8:00 pm.  8   If you are nauseated, you may take the 2nd Reglan/Metoclopramide  tablet at 6:30 pm.        9    At 8:00 pm take 2 more DULCOLAX/Bisacodyl tablets.     THE DAY OF YOUR PROCEDURE      DATE:  08/03/10   DAY: Farrell Ours  You may drink clear liquids until 9:00AM  (2 HOURS BEFORE PROCEDURE).   MEDICATION INSTRUCTIONS  Unless otherwise instructed, you should take regular prescription medications with a small sip of water as early as possible the morning of your procedure.        OTHER INSTRUCTIONS  You will need a responsible adult at least 62 years of age to accompany you and drive you home.   This person must remain in the waiting room during your procedure.  Wear loose fitting clothing that is easily removed.  Leave jewelry and other valuables at home.  However, you may wish to bring a book to read or an iPod/MP3 player to listen to music as you wait for your procedure to start.  Remove all body piercing jewelry and leave at home.  Total time from sign-in until discharge is approximately 2-3 hours.  You should go home directly after your procedure and rest.  You can resume normal activities the day after your procedure.  The day of your procedure you should not:   Drive  Make legal decisions   Operate machinery   Drink alcohol   Return to work  You will receive specific instructions about eating, activities and medications before you leave.   The above instructions have been reviewed and explained to me by   Wyona Almas RN  July 20, 2010 11:13 AM     I fully understand and can verbalize these instructions _____________________________ Date _______

## 2010-11-20 NOTE — Assessment & Plan Note (Signed)
[facility]                            CARDIOLOGY OFFICE NOTE   ASHAKI, FROSCH                       MRN:          [id number]  DATE:03/02/2008                            DOB:          [birth year]    PRIMARY CARE PHYSICIAN:  Arta Silence, MD   REASON FOR PRESENTATION:  The patient with hypertrophic cardiomyopathy.   HISTORY OF PRESENT ILLNESS:  The patient has a history of hypertrophic  cardiomyopathy with a mild gradient documented on echocardiogram.  I did  a stress test on her and found no high-risk features for sudden cardiac  death.  In fact, she had a very hypertensive blood pressure response  with exercise.  There was no evidence of ischemia on this.  With this  study, I planned further management of her blood pressure.  I have told  her not to participate in very aggressive physical activity, though she  still walking.  She says she feels pretty well.  She actually developed  some leg pain in her thigh today.  Coincidentally, she woke up with  this.  She had been a little dizzy today.  She did not describing any  orthostatic symptoms.  She has not been having any palpitation,  presyncope, or syncope.  She has not been have any chest pain or  shortness of breath.   PAST MEDICAL HISTORY:  Hypertrophic cardiomyopathy, hypertension  particular with exercise, ganglion cyst removed, oophorectomy in 1990,  and second oophorectomy in 2000, hemorrhoid surgery in 1980, and breast  reduction in 2001.   </ALLERGIES/INTOLERANCES>  PENICILLIN and MORPHINE.   MEDICATIONS:  Verapamil 240 mg.   REVIEW OF SYSTEMS:  As stated in the HPI, otherwise, negative other  systems.   PHYSICAL EXAMINATION:  GENERAL:  The patient is in no distress.  Blood  pressure 121/84 without an orthostatic drop, heart rate 60 and regular.  HEENT:  Eyes unremarkable; pupils equal, round, reactive to light; fundi  not visualized.  NECK:  No jugular venous distention  at 45 degrees; carotid upstroke  brisk and symmetric; no bruits, no thyromegaly.  LUNGS:  Clear to auscultation bilaterally.  CHEST:  Unremarkable.  HEART:  PMI not displaced or sustained; S1 and S2 within normal limits;  no S3, no S4, no clicks, no rubs, and no murmurs.  ABDOMEN:  Flat,  positive bowel sounds, normal in frequency and pitch; no bruits, no  rebound, no guarding, no midline pulsatile mass, and no organomegaly.  SKIN:  No rashes, no nodules.  EXTREMITIES:  A 2+ pulse, no edema.   EKG sinus rhythm, rate 67, axis within normal limits, intervals within  normal limits, and no acute ST-T wave changes.   ASSESSMENT AND PLAN:  1. Hypertrophic cardiomyopathy.  The patient does have a mild      hypertrophic cardiomyopathy with mild gradient.  She did not have      any symptoms related to this and has no high-risk features.  The      interventricular septum only measures at 13.  At this point, we      will follow this clinically.  She remain on verapamil.  2. Hypertension.  She does have exercise-induced hypertension.  She      brought me a list of her blood pressure and actually it run in the      100 to 110s and occasionally 90s, but she is not symptomatic with      any lightheadedness or other symptoms of low-blood pressure.      Therefore, we will continue this therapy.  3. Palpitation.  She is not having any of these.  No further      evaluation is warranted.  4. Obesity.  The patient was worried about her weight.  I have      instructed her count every calorie and keep this diary.  She could      then reducer calorie intake by 20% and this should help with weight      loss as she continues her activity.  5. Follow up.  I will see her in 1 year or sooner if needed.     Rollene Rotunda, MD, [facility]  Electronically Signed    JH/MedQ  DD: 03/02/2008  DT: 03/03/2008  Job #: [id number]

## 2010-11-20 NOTE — Assessment & Plan Note (Signed)
[name] OFFICE NOTE   MIKALIA, Prince                       MRN:          [id number]  DATE:08/04/2007                            DOB:          06/30/1951    PRIMARY CARE PHYSICIAN:  Arta Silence, MD   REASON FOR PRESENTATION:  Evaluate patient with palpitations and a  previous history of apparent hypertrophic cardiomyopathy.   HISTORY OF PRESENT ILLNESS:  The patient is a lovely 60 year old African  American female with a history of palpitations.  She said around 2001,  she had palpitations.  She did see a cardiologist when she was living in  [place].  She describes a diagnosis consistent with hypertrophic  cardiomyopathy.  She was treated with verapamil for years and currently  is still on this drug.  The whole workup was initiated with palpitations  and presyncope.  She never had any syncope.  She had stress tests and  monitors.  She had echocardiograms.  She said that over serial  echocardiograms spanning about three years, she was told her enlarged  heart had resolved.  She has never had any chest pressure, neck or arm  discomfort.  She does not have any shortness of breath, PND or  orthopnea.  She has been an athlete and in fact, has run two marathons.  Currently she does exercise walking, but is not running.  She said that  with this level of activity she does not bring on any symptoms.   She does have palpitations.  She notices these more in the morning.  They happen sporadically.  She describes rapid heart beats lasting for a  few seconds.  She feels a fullness in her throat.  She has never had any  sustained arrhythmias.  She cannot bring them on.  She does drink three  caffeinated coffees every morning.  She said that she has been having  them fairly consistently daily for the last few weeks.  Prior to this  they were more sporadic.   PAST MEDICAL HISTORY:  Patient has no history of  hypertension, diabetes  or hyperlipidemia.   PAST SURGICAL HISTORY:  Ganglion cyst removed from her foot.  Oophorectomy 1990 and second oophorectomy 2000.  Hemorrhoid surgery in  1980.  Breast reduction 2001.   ALLERGIES:  PENICILLIN, MORPHINE.   MEDICATIONS:  1. Verapamil 240 mg daily.  2. Fish oil.  3. B Complex vitamin.  4. Calcium.  5. Echinacea.  6. Vitamin E.   REVIEW OF SYSTEMS:  As stated in the HPI and otherwise negative for  other systems.   PHYSICAL EXAMINATION:  The patient is in no distress.  Blood pressure:  118/72.  Heart rate:  61, regular.  HEENT:  Eyes unremarkable.  Pupils equal, round, reactive to light.  Fundi within normal limits.  Oral mucosa unremarkable.  NECK:  No jugular venous distension at 45 degrees.  Carotid upstroke  brisk and symmetric.  No bruits, no thyromegaly.  LYMPHATICS:  No cervical, axillary, inguinal adenopathy.  LUNGS:  Clear to auscultation bilaterally.  BACK:  No costovertebral angle  tenderness.  CHEST:  Unremarkable.  HEART:  PMI not displaced or sustained.  S1, S2 within normal limits.  No S3, no S4.  No clicks, rubs, murmurs.  ABDOMEN:  Flat.  Positive bowel sounds normal in frequency and pitch. No  bruits, rebound, guarding.  No midline pulsatile mass.  No hepatomegaly.  No splenomegaly.  SKIN:  No rashes.  No nodules.  EXTREMITIES:  2+ pulses throughout.  No edema.  No cyanosis or clubbing.  NEURO:  Oriented to person, place and time.  Cranial nerves II-XII  grossly intact.  Motor grossly intact throughout.   EKG:  Sinus rhythm.  Rate 61.  Axis within normal limits.  Intervals  within normal limits.  No acute ST-T wave changes.   ASSESSMENT/PLAN:  1. The patient is having frequent palpitations.  They sound like      ectopic beats.  I cannot rule out a more sustained dysrhythmia.      She will have a 24 hour Holter monitor.  We will check with Dr.      Lorenza Chick office to make sue she has had electrolytes and a TSH.  2.  Questionable hypertrophic cardiomyopathy.  The patient sounds like      she has had this diagnosis and she recalls this terminology.  I do      not suspect this on physical exam, but will repeat an      echocardiogram, especially since she wants to get back into a      running regimen.  3. Exercise.  We did discuss this at length.  She will slowly      accelerate her exercise once she has had this evaluation.  4. Lifestyle changes.  I have suggested that she stop caffeine      completely.  She could do this at least to see if the symptoms go      away.  5. Medications.  She will continue on the verapamil as listed.  6. Followup will be based on the results of the above and further      symptoms.     Rollene Rotunda, MD, [facility]  Electronically Signed    JH/MedQ  DD: 08/04/2007  DT: 08/04/2007  Job #: [id number]   cc:   Arta Silence, MD

## 2010-11-20 NOTE — Procedures (Signed)
[facility]                              EXERCISE TREADMILL   [name], [name]                       MRN:          [id number]  DATE:08/28/2007                            DOB:          11-29-1950    PROBLEM:  Exercise treadmill test.   INDICATIONS:  Evaluate patient with hyper hypertrophic cardiomyopathy  (predominantly concentric hypertrophy but with some slight gradient to  LV outflow) who wants to do an exercise regimen.   PROCEDURE NOTE:  The patient was exercised using standard Bruce  protocol.  She is able to exercise for 9 minutes which completed stage  III.  Test was terminated because of the fatigue and hypertensive blood  pressure response.  She achieved 80% of her heart rate which is 137  beats per minute.  She had accelerated blood pressure response to  225/108.  There were no ischemic ST-T wave changes.  There was no  ectopy.  She had no chest discomfort.  She had felt her head pounding  somewhat.  She did have an normal heart rate recovery.  Blood pressure  fell nicely in the recovery phase.   CONCLUSION:  Negative inadequate exercise treadmill test, terminated  because of blood pressure.  No high-grade evidence of obstructive  disease though I could not exclude this possibility at higher levels of  exertion.   PLAN:  Based on the above, I do not think further screening for coronary  disease is warranted.  However, the patient had wanted to run 5K races.  She cannot do this given her blood pressure response.  I described for  her where it would be safe for her to exercise.  Perhaps a heart rate in  the 70% range or Borg scale of about 13.  She should keep blood pressure  diary at home.  She should take it with exercise as well.  She should  remain on the medicines as listed and practice therapeutic lifestyle  changes to further reduce her blood pressure.  At this point, will  follow her left ventricular hypertrophy with  echocardiograms and  physical exams.  There is no evidence of high-grade features for sudden  cardiac death.  Again this is a more concentric LVH rather than septal  hypertrophy.  This is probably related to her blood pressure.   FOLLOWUP:  I would like to see her back in about 6 months for follow-up  of the above issues.  Again she should not participate in high level  physical activity.     Rollene Rotunda, MD, [facility]  Electronically Signed    JH/MedQ  DD: 08/28/2007  DT: 08/29/2007  Job #: [id number]   cc:   Arta Silence, MD

## 2010-12-21 ENCOUNTER — Encounter: Payer: Self-pay | Admitting: Cardiovascular Disease

## 2011-01-03 ENCOUNTER — Other Ambulatory Visit: Payer: Self-pay | Admitting: Family Medicine

## 2011-01-03 DIAGNOSIS — I10 Essential (primary) hypertension: Secondary | ICD-10-CM

## 2011-01-07 ENCOUNTER — Other Ambulatory Visit (INDEPENDENT_AMBULATORY_CARE_PROVIDER_SITE_OTHER): Payer: Self-pay | Admitting: Family Medicine

## 2011-01-07 DIAGNOSIS — I10 Essential (primary) hypertension: Secondary | ICD-10-CM

## 2011-01-07 LAB — COMPREHENSIVE METABOLIC PANEL
AST: 37 U/L (ref 0–37)
Albumin: 4.2 g/dL (ref 3.5–5.2)
BUN: 11 mg/dL (ref 6–23)
Calcium: 9.5 mg/dL (ref 8.4–10.5)
Chloride: 102 mEq/L (ref 96–112)
Creatinine, Ser: 0.8 mg/dL (ref 0.4–1.2)
GFR: 100.76 mL/min (ref >60.00)
Glucose, Bld: 96 mg/dL (ref 70–99)
Potassium: 4 mEq/L (ref 3.5–5.1)

## 2011-01-07 LAB — LIPID PANEL
Cholesterol: 157 mg/dL (ref 0–200)
HDL: 42.6 mg/dL (ref >39.00)
Total CHOL/HDL Ratio: 4
Triglycerides: 110 mg/dL (ref 0.0–149.0)

## 2011-01-09 ENCOUNTER — Encounter: Payer: Self-pay | Admitting: Family Medicine

## 2011-01-11 ENCOUNTER — Encounter: Payer: Self-pay | Admitting: Family Medicine

## 2011-01-11 ENCOUNTER — Other Ambulatory Visit: Payer: Self-pay | Admitting: Family Medicine

## 2011-01-11 ENCOUNTER — Ambulatory Visit (INDEPENDENT_AMBULATORY_CARE_PROVIDER_SITE_OTHER): Payer: Self-pay | Admitting: Family Medicine

## 2011-01-11 DIAGNOSIS — Z1231 Encounter for screening mammogram for malignant neoplasm of breast: Secondary | ICD-10-CM

## 2011-01-11 DIAGNOSIS — I421 Obstructive hypertrophic cardiomyopathy: Secondary | ICD-10-CM

## 2011-01-11 DIAGNOSIS — Z0000 Encounter for general adult medical examination without abnormal findings: Secondary | ICD-10-CM

## 2011-01-11 NOTE — Patient Instructions (Signed)
Call about the follow up with cardiology. Please call about your mammogram.  Don't change your meds. Keep exercising.  Let me know if the abdominal pain continues.  I think it is likely triggered by certain foods and doesn't need other testing now.   Take care.   Check with your insurance to see if they will cover the shingles shot. I would get a flu shot each fall.

## 2011-01-11 NOTE — Progress Notes (Signed)
CPE- See plan.  Routine anticipatory guidance given to patient.  See health maintenance.  H/o abd discomfort. Occ variable location of mild pain.  No FCNAVD. Noted more at night.  She notes it more with/after certain foods, but she has no allergic sx (rash, airway sx, etc).  Sx aren't acute in duration.  Feeling well o/w w/o CP/SOB/BLE edema.    PMH and SH reviewed  Meds, vitals, and allergies reviewed.   ROS: See HPI.  Otherwise negative.    GEN: nad, alert and oriented HEENT: mucous membranes moist NECK: supple w/o LA CV: rrr. PULM: ctab, no inc wob ABD: soft, +bs EXT: no edema SKIN: no acute rash Breast exam: No mass, nodules, thickening, tenderness, bulging, retraction, inflamation, nipple discharge or skin changes noted.  No axillary or clavicular LA.  Chaperoned exam.

## 2011-01-12 ENCOUNTER — Encounter: Payer: Self-pay | Admitting: Family Medicine

## 2011-01-12 DIAGNOSIS — Z0000 Encounter for general adult medical examination without abnormal findings: Secondary | ICD-10-CM | POA: Insufficient documentation

## 2011-01-12 NOTE — Assessment & Plan Note (Signed)
Feeling well and she'll call about cards f/u.  Appreciate cards input.

## 2011-01-12 NOTE — Assessment & Plan Note (Signed)
Pt to call about mammogram.  Flu shot in the fall. Pt to check on zostavax coverage.  No pap needed.  Tdap and colonoscopy up to date.  She'll monitor the GI sx.  Benign exam and known triggers.  No intervention o/w today.  Healthy habits d/w pt.  Labs d/w pt.

## 2011-01-23 ENCOUNTER — Encounter: Payer: Self-pay | Admitting: Cardiology

## 2011-01-23 ENCOUNTER — Ambulatory Visit
Admission: RE | Admit: 2011-01-23 | Discharge: 2011-01-23 | Disposition: A | Discharge status: Home / Self Care | Payer: Self-pay | Source: Ambulatory Visit | Attending: Family Medicine | Admitting: Family Medicine

## 2011-01-23 DIAGNOSIS — Z1231 Encounter for screening mammogram for malignant neoplasm of breast: Secondary | ICD-10-CM

## 2011-01-24 ENCOUNTER — Ambulatory Visit (INDEPENDENT_AMBULATORY_CARE_PROVIDER_SITE_OTHER): Payer: Self-pay | Admitting: Cardiology

## 2011-01-24 ENCOUNTER — Encounter: Payer: Self-pay | Admitting: Cardiology

## 2011-01-24 DIAGNOSIS — I10 Essential (primary) hypertension: Secondary | ICD-10-CM

## 2011-01-24 DIAGNOSIS — I080 Rheumatic disorders of both mitral and aortic valves: Secondary | ICD-10-CM | POA: Insufficient documentation

## 2011-01-24 DIAGNOSIS — I421 Obstructive hypertrophic cardiomyopathy: Secondary | ICD-10-CM

## 2011-01-24 DIAGNOSIS — R002 Palpitations: Secondary | ICD-10-CM

## 2011-01-24 NOTE — Patient Instructions (Signed)
Your physician has requested that you have an echocardiogram. Echocardiography is a painless test that uses sound waves to create images of your heart. It provides your doctor with information about the size and shape of your heart and how well your heart's chambers and valves are working. This procedure takes approximately one hour. There are no restrictions for this procedure.  Follow up in 1 year with Dr Antoine Poche.  You will receive a letter in the mail 2 months before you are due.  Please call us when you receive this letter to schedule your follow up appointment.

## 2011-01-24 NOTE — Assessment & Plan Note (Signed)
This will be followed up with an echocardiogram.

## 2011-01-24 NOTE — Assessment & Plan Note (Signed)
He had been over 2 years. I will follow an echocardiogram. We discussed this today.

## 2011-01-24 NOTE — Assessment & Plan Note (Signed)
The blood pressure is at target. No change in medications is indicated. We will continue with therapeutic lifestyle changes (TLC).  

## 2011-01-24 NOTE — Progress Notes (Signed)
HPI The patient presents for followup of mild left ventricular hypertrophy. She's also had palpitations. I reviewed her last echo which was 2010. There was questionable moderate mitral regurgitation directed posteriorly. There was LVH. This was severe. She is also had some aortic insufficiency. She's had no new symptoms. The patient denies any new symptoms such as chest discomfort, neck or arm discomfort. There has been no new shortness of breath, PND or orthopnea. There have been no reported, presyncope or syncope.  She does occasionally get palpitations but these are not significantly symptomatic and they are fleeting.  Allergies  Allergen Reactions  . Morphine Sulfate     REACTION: swelling  . Penicillins     REACTION: swelling    Current Outpatient Prescriptions  Medication Sig Dispense Refill  . Cyanocobalamin (VITAMIN B 12 PO) Take 1 tablet by mouth daily.        . multivitamin (THERAGRAN) per tablet Take 1 tablet by mouth daily.        . TURMERIC PO Take 1 capsule by mouth daily.        . verapamil (CALAN-SR) 240 MG CR tablet Take 240 mg by mouth daily.          Past Medical History  Diagnosis Date  . Hypertrophic cardiomyopathy   . Hypertension     particular with exercise  . Substance abuse     history of, sober for years as of 2012    Past Surgical History  Procedure Date  . Oophorectomy 1980    right  . Doppler echocardiography 04/20/01    Mild LVH TR, MI, TI  . Nm myoview ltd 02/09/03    ETI, EF 72%  . Colonoscopy 5/05    SIGMF H/O colon ca father/aunt, sister with polyps 08/10/98  . Myomectomy 10/09/99    x2 pelvic US  . Holter     22 hrs- slow rhythym 54-116   - 3 ectopics rare svrr vein 02/06/00  . Nsvd     x 2  . Esophagogastroduodenoscopy 06/09/06    microscopically abnormal, Esoph and Gastric bx's  . Ganglionectomy     R foot, 12 YOA  . Thumb repair 1996    R LAC  . Breast reduction surgery 2001  . Colonoscopy divertuics 03/16/2007    SM INT HEMMS Dr.  Marva Panda  . 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 Juanda Chance     50yrs  . Partial hysterectomy 2002    with no remaining ovary  . Ovarian cyst drainage 1980  . Colonoscopy 08/10/98    Nml    ROS:  As stated in the HPI and negative for all other systems.  PHYSICAL EXAM BP 106/78  Pulse 64  Ht 5' 5.5" (1.664 m)  Wt 183 lb (83.008 kg)  BMI 29.99 kg/m2 GENERAL:  Well appearing HEENT:  Pupils equal round and reactive, fundi not visualized, oral mucosa unremarkable NECK:  No jugular venous distention, waveform within normal limits, carotid upstroke brisk and symmetric, no bruits, no thyromegaly LYMPHATICS:  No cervical, inguinal adenopathy LUNGS:  Clear to auscultation bilaterally BACK:  No CVA tenderness CHEST:  Unremarkable HEART:  PMI not displaced or sustained,S1 and S2 within normal limits, no S3, no S4, no clicks, no rubs, no murmurs ABD:  Flat, positive bowel sounds normal in frequency in pitch, no bruits, no rebound, no guarding, no midline pulsatile mass, no hepatomegaly, no splenomegaly EXT:  2 plus pulses throughout, no edema, no cyanosis no clubbing SKIN:  No  rashes no nodules NEURO:  Cranial nerves II through XII grossly intact, motor grossly intact throughout PSYCH:  Cognitively intact, oriented to person place and time  EKG:  Sinus rhythm, rate 64 his outpatient we then, axis within normal limits, intervals within normal limits, no acute ST-T wave changes.   ASSESSMENT AND PLAN

## 2011-01-24 NOTE — Assessment & Plan Note (Signed)
These are not particularly symptomatic.  No change in therapy is indicated. 

## 2011-01-29 ENCOUNTER — Ambulatory Visit (HOSPITAL_COMMUNITY): Payer: Managed Care, Other (non HMO) | Attending: Cardiology

## 2011-01-29 ENCOUNTER — Encounter: Payer: Self-pay | Admitting: *Deleted

## 2011-01-29 DIAGNOSIS — I079 Rheumatic tricuspid valve disease, unspecified: Secondary | ICD-10-CM | POA: Insufficient documentation

## 2011-01-29 DIAGNOSIS — I421 Obstructive hypertrophic cardiomyopathy: Secondary | ICD-10-CM | POA: Insufficient documentation

## 2011-01-29 DIAGNOSIS — I379 Nonrheumatic pulmonary valve disorder, unspecified: Secondary | ICD-10-CM | POA: Insufficient documentation

## 2011-01-29 DIAGNOSIS — I10 Essential (primary) hypertension: Secondary | ICD-10-CM | POA: Insufficient documentation

## 2011-01-29 DIAGNOSIS — I080 Rheumatic disorders of both mitral and aortic valves: Secondary | ICD-10-CM

## 2011-01-29 DIAGNOSIS — I059 Rheumatic mitral valve disease, unspecified: Secondary | ICD-10-CM | POA: Insufficient documentation

## 2011-02-13 ENCOUNTER — Telehealth: Payer: Self-pay | Admitting: Cardiology

## 2011-02-13 NOTE — Telephone Encounter (Signed)
Returning call back. 

## 2011-02-13 NOTE — Telephone Encounter (Signed)
I spoke with the patient. She states she just spoke to [place] about her echo, but she was not in a place where she could ask questions and just wanted to clarify a few things that were said. I addressed the patient's concerns.

## 2011-05-02 ENCOUNTER — Other Ambulatory Visit: Payer: Self-pay | Admitting: Family Medicine

## 2011-05-27 ENCOUNTER — Encounter: Payer: Self-pay | Admitting: Family Medicine

## 2011-05-27 ENCOUNTER — Ambulatory Visit (INDEPENDENT_AMBULATORY_CARE_PROVIDER_SITE_OTHER): Payer: BC Managed Care – PPO | Admitting: Family Medicine

## 2011-05-27 VITALS — BP 116/76 | HR 73 | Temp 98.2°F | Wt 192.0 lb

## 2011-05-27 DIAGNOSIS — R109 Unspecified abdominal pain: Secondary | ICD-10-CM

## 2011-05-27 NOTE — Assessment & Plan Note (Signed)
Epigastric, unclear source.  Check h pylori, hep panel, LFTs, pancreatic enzymes and check u/s given the FH of gallstones.  Nontoxic, she agrees. >25 min spent with face to face with patient, >50% counseling.

## 2011-05-27 NOTE — Patient Instructions (Addendum)
Let me know about the suppositories.   See Aram Beecham about your referral before you leave today. You can get your results through our phone system.  Follow the instructions on the blue card. Take care.

## 2011-05-27 NOTE — Progress Notes (Signed)
Abd pain going on for years.  She initially felt a 'tapping' in her abd then it was 'contracting'.  Progressively worse in the interval.    Prev history from 7/12: H/o abd discomfort. Occ variable location of mild pain. No FCNAVD. Noted more at night. She notes it more with/after certain foods, but she has no allergic sx (rash, airway sx, etc). Sx aren't acute in duration. Feeling well o/w w/o CP/SOB/BLE edema.   Since then, the abd discomfort has gotten worse.  She did have h/o of diverticulitis, that was a limited episode and isn't similar to current sx.  This discomfort is now chronic, sometimes worse than others.  Pain is in upper abd in a horizontal band, occ with LLQ pain that is brief.  No FCNAV.  She felt like she was going to have diarrhea this AM, but sx resolved without diarrhea.  She did have some prev diarrhea noted, nonbloody.  She does have some hemorrhoids, prev used suppositories.    Drinks 2 bottles of wine a week, no effect on sx when she stopped drinking.  Sister and brother with gallbladder disease.   H/o colonoscopy w/o sig disease and prev hysterectomy noted.   PMH and SH reviewed  ROS: See HPI, otherwise noncontributory.  Meds, vitals, and allergies reviewed.   nad ncat Mmm rrr ctab abd soft, normal BS, no rebound but ttp in the epigastrum, murphy's neg B lower abd not ttp No masses noted Ext well perfused

## 2011-05-28 LAB — HEPATIC FUNCTION PANEL
ALT: 52 U/L — ABNORMAL HIGH (ref 0–35)
AST: 46 U/L — ABNORMAL HIGH (ref 0–37)
Total Bilirubin: 0.6 mg/dL (ref 0.3–1.2)
Total Protein: 8.2 g/dL (ref 6.0–8.3)

## 2011-05-28 LAB — CBC WITH DIFFERENTIAL/PLATELET
Basophils Relative: 0.4 % (ref 0.0–3.0)
Eosinophils Relative: 2.4 % (ref 0.0–5.0)
HCT: 38.9 % (ref 36.0–46.0)
Hemoglobin: 12.9 g/dL (ref 12.0–15.0)
Lymphs Abs: 2.3 10*3/uL (ref 0.7–4.0)
MCV: 86.2 fl (ref 78.0–100.0)
Monocytes Absolute: 0.4 10*3/uL (ref 0.1–1.0)
Monocytes Relative: 7.8 % (ref 3.0–12.0)
Platelets: 202 10*3/uL (ref 150.0–400.0)
RBC: 4.51 Mil/uL (ref 3.87–5.11)
WBC: 5 10*3/uL (ref 4.5–10.5)

## 2011-05-28 LAB — AMYLASE: Amylase: 107 U/L (ref 27–131)

## 2011-05-29 ENCOUNTER — Other Ambulatory Visit: Payer: Self-pay | Admitting: Family Medicine

## 2011-05-29 DIAGNOSIS — Z8619 Personal history of other infectious and parasitic diseases: Secondary | ICD-10-CM | POA: Insufficient documentation

## 2011-05-29 DIAGNOSIS — R768 Other specified abnormal immunological findings in serum: Secondary | ICD-10-CM

## 2011-06-03 ENCOUNTER — Ambulatory Visit
Admission: RE | Admit: 2011-06-03 | Discharge: 2011-06-03 | Disposition: A | Discharge status: Home / Self Care | Payer: BC Managed Care – PPO | Source: Ambulatory Visit | Attending: Family Medicine | Admitting: Family Medicine

## 2011-06-03 DIAGNOSIS — R109 Unspecified abdominal pain: Secondary | ICD-10-CM

## 2011-06-04 ENCOUNTER — Telehealth: Payer: Self-pay | Admitting: Family Medicine

## 2011-06-04 ENCOUNTER — Other Ambulatory Visit (INDEPENDENT_AMBULATORY_CARE_PROVIDER_SITE_OTHER): Payer: BC Managed Care – PPO

## 2011-06-04 DIAGNOSIS — R894 Abnormal immunological findings in specimens from other organs, systems and tissues: Secondary | ICD-10-CM

## 2011-06-04 DIAGNOSIS — R768 Other specified abnormal immunological findings in serum: Secondary | ICD-10-CM

## 2011-06-04 DIAGNOSIS — K8020 Calculus of gallbladder without cholecystitis without obstruction: Secondary | ICD-10-CM

## 2011-06-04 LAB — HEPATITIS B SURFACE ANTIBODY,QUALITATIVE: Hep B S Ab: NEGATIVE

## 2011-06-04 MED ORDER — HYDROCORTISONE ACETATE 25 MG RE SUPP: 25.0000 mg | Freq: Two times a day (BID) | RECTAL | Status: AC

## 2011-06-04 NOTE — Telephone Encounter (Signed)
Patient was not seen today, she came to the lab today.

## 2011-06-04 NOTE — Telephone Encounter (Signed)
[name] called back, says the medication is requesting is Hydrocortisone Acetote 25mg  Suppostory. Patients call back # [phone]..[name]

## 2011-06-04 NOTE — Telephone Encounter (Signed)
Patient advised.

## 2011-06-04 NOTE — Telephone Encounter (Signed)
I talked with patient about the u/s results.  I'll await her other labs but given the gallstones on the u/s, I would refer to [facility] for input on cholecystectomy.  Pt agreed.  Referral done. Please notify pt that I sent the rx.

## 2011-06-04 NOTE — Telephone Encounter (Signed)
Pt stopped at the front desk, she needed some suppository. Pt said she forgot to discuss this w/ Dr. Para March during her appointment on today. Pt could not go into full detail she had to leave and go back to work. She will call back. cdavis 06-04-2011

## 2011-06-07 ENCOUNTER — Encounter: Payer: Self-pay | Admitting: Family Medicine

## 2011-06-11 ENCOUNTER — Telehealth: Payer: Self-pay | Admitting: Family Medicine

## 2011-06-11 DIAGNOSIS — B1920 Unspecified viral hepatitis C without hepatic coma: Secondary | ICD-10-CM

## 2011-06-11 NOTE — Telephone Encounter (Signed)
Please call pt.  Her HCV antigen test was positive.  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  She needs a referral to the hepatitis clinic, and I ordered this.  She'll need a HCV genotype test.  It is ordered and this can be done at a lab visit.  She'll also need vaccination against Hep A and Hep B.  Please set up the RN visits for this.  We'll know more about her condition then the genotype is back and when she goes to the hepatology clinic.  Thanks.

## 2011-06-12 ENCOUNTER — Telehealth: Payer: Self-pay | Admitting: Family Medicine

## 2011-06-12 NOTE — Telephone Encounter (Signed)
I called pt about the HCV tests.  She'll start HAV/HBV vaccination tomorrow.  Awaiting genotype, lab visit tomorrow.  Will have info sent to hep clinic at that point.  I told her that I would need hep clinic help in caring for this condition.  She understood.

## 2011-06-12 NOTE — Telephone Encounter (Signed)
Patient advised.  Lab appt scheduled 06/13/11 as well as RN visit for Hepatitis A and Hepatitis B vaccine.  Pt will await call from [facility] for Hepatitis Clinic appt.

## 2011-06-13 ENCOUNTER — Ambulatory Visit: Payer: BC Managed Care – PPO

## 2011-06-13 ENCOUNTER — Other Ambulatory Visit (INDEPENDENT_AMBULATORY_CARE_PROVIDER_SITE_OTHER): Payer: BC Managed Care – PPO

## 2011-06-13 DIAGNOSIS — B1920 Unspecified viral hepatitis C without hepatic coma: Secondary | ICD-10-CM

## 2011-06-18 ENCOUNTER — Encounter (INDEPENDENT_AMBULATORY_CARE_PROVIDER_SITE_OTHER): Payer: Self-pay | Admitting: Surgery

## 2011-06-18 ENCOUNTER — Ambulatory Visit (INDEPENDENT_AMBULATORY_CARE_PROVIDER_SITE_OTHER): Payer: BC Managed Care – PPO | Admitting: Surgery

## 2011-06-18 VITALS — BP 122/80 | HR 66 | Temp 97.8°F | Resp 18 | Ht 66.0 in | Wt 191.0 lb

## 2011-06-18 DIAGNOSIS — K8020 Calculus of gallbladder without cholecystitis without obstruction: Secondary | ICD-10-CM

## 2011-06-18 NOTE — Patient Instructions (Signed)
We will schedule surgery to remove your gallbladder. If you have any more questions please call the office.

## 2011-06-18 NOTE — Progress Notes (Signed)
NAME: [name]                                                                                      DOB: 03-06-1951 DATE: 06/18/2011               MRN: [id number]   CC:  Chief Complaint  Patient presents with  . Abdominal Pain    Eval gallbladder    HPI:  [name] is a 60 y.o.  female who was referred  by Dr. Buelah Manis evaluation of I think we should do an excisional biopsy to rule out an early breast cancer. Although she does have significant risk factors I think this be done under IV sedation and local with relatively low risk. Began a guidewire placed her we could. It was included in the biopsy as well some of the subareolar tissue to be sure we clear all the ectatic ducts.  I've reviewed the plans for the patient and she understands and agrees and would like to proceed. I told her we will need to have a cardiology evaluation preoperatively to make sure there no other risks that need to be managed at a time and to help [name] decide about how to manage her Coumadin perioperatively.  PMH:  has a past medical history of Hypertrophic cardiomyopathy; Hypertension; Substance abuse; Gallstones (06/18/2011); Asthma; Generalized headaches; and Cough.  PSH:   has past surgical history that includes Oophorectomy (1980); doppler echocardiography (04/20/01); nm myoview ltd (02/09/03); Colonoscopy (5/05); Myomectomy (10/09/99); Holter; NSVD; Esophagogastroduodenoscopy (06/09/06); Ganglionectomy; Thumb Repair (1996); Breast reduction surgery (2001); Colonoscopy Divertuics (03/16/2007); Colonoscopy (08/03/2010); Partial hysterectomy (2002); Ovarian cyst drainage (1980); Colonoscopy (08/10/98); Breast surgery (2001); and lump removed (1964).  ALLERGIES:   Allergies  Allergen Reactions  . Morphine Sulfate Swelling    Of face and tongue.  Marland Kitchen Penicillins Swelling    Of face and tongue.    MEDICATIONS: Current outpatient prescriptions:hydrocortisone (ANUSOL-HC) 25 MG suppository, as needed., Disp: , Rfl: ;   TURMERIC PO, Take 1 capsule by mouth daily.  , Disp: , Rfl: ;  verapamil (CALAN-SR) 240 MG CR tablet, TAKE 1 TABLET BY MOUTH ONCE A DAY, Disp: 30 tablet, Rfl: 11  ROS: She has filled out our 12 point review of systems and it is negative except for cough (getting over the flu) and mild headaches.   EXAM:   GENERAL:  The patient is alert, oriented, and generally healthy-appearing, NAD. Mood and affect are normal.  HEENT:  The head is normocephalic, the eyes nonicteric, the pupils were round regular and equal. EOMs are normal. Pharynx normal. Dentition good.  NECK:  The neck is supple and there are no masses or thyromegaly.  LUNGS: Normal respirations and clear to auscultation.  HEART: Regular rhythm, with no murmurs rubs or gallops. Pulses are intact carotid dorsalis pedis and posterior tibial. No significant varicosities are noted.  ABDOMEN: Soft, flat, and nontender. No masses or organomegaly is noted. No hernias are noted. Bowel sounds are normal.  EXTREMITIES:  Good range of motion, no edema.  DATA REVIEWED:  Epic notes and the ultrasound report are reviewed  IMPRESSION:  Gallstones. Possibly causing her current  symptoms  PLAN:   Cholecystectomy. I have discussed the indications for laparoscopic cholecystectomy with her and provided educational material. We have discussed the risks of surgery, including general risks such as bleeding, infection, lung and heart issues etc. We have also discussed the potential for injuries to other organs, bile duct leaks, and other unexpected events. We have also talked about the fact that this may need to be converted to open under certain circumstances. We discussed the typical post op recovery and the fact that there is a moderate likelihood of improvement in symptoms and return to normal activity.  She understands this and wishes to proceed to schedule surgery. I believe all of .his questions have been answered.     [name]  J 06/18/2011  CC: Joaquim Nam, MD, Crawford Givens, MD, MD

## 2011-06-20 ENCOUNTER — Ambulatory Visit (INDEPENDENT_AMBULATORY_CARE_PROVIDER_SITE_OTHER): Payer: BC Managed Care – PPO | Admitting: *Deleted

## 2011-06-20 DIAGNOSIS — Z23 Encounter for immunization: Secondary | ICD-10-CM

## 2011-06-21 ENCOUNTER — Encounter: Payer: Self-pay | Admitting: Family Medicine

## 2011-06-21 LAB — HEPATITIS C GENOTYPE

## 2011-07-04 ENCOUNTER — Encounter (HOSPITAL_COMMUNITY): Payer: Self-pay

## 2011-07-15 ENCOUNTER — Encounter (HOSPITAL_COMMUNITY): Payer: Self-pay

## 2011-07-15 ENCOUNTER — Encounter (HOSPITAL_COMMUNITY)
Admission: RE | Admit: 2011-07-15 | Discharge: 2011-07-15 | Disposition: A | Discharge status: Home / Self Care | Payer: BC Managed Care – PPO | Source: Ambulatory Visit | Attending: Surgery | Admitting: Surgery

## 2011-07-15 DIAGNOSIS — K8020 Calculus of gallbladder without cholecystitis without obstruction: Secondary | ICD-10-CM

## 2011-07-15 LAB — DIFFERENTIAL
Basophils Absolute: 0 10*3/uL (ref 0.0–0.1)
Basophils Relative: 1 % (ref 0–1)
Eosinophils Absolute: 0.2 10*3/uL (ref 0.0–0.7)
Eosinophils Relative: 4 % (ref 0–5)
Monocytes Absolute: 0.3 10*3/uL (ref 0.1–1.0)

## 2011-07-15 LAB — COMPREHENSIVE METABOLIC PANEL
ALT: 44 U/L — ABNORMAL HIGH (ref 0–35)
AST: 37 U/L (ref 0–37)
Alkaline Phosphatase: 77 U/L (ref 39–117)
CO2: 30 mEq/L (ref 19–32)
Calcium: 9.7 mg/dL (ref 8.4–10.5)
Chloride: 96 mEq/L (ref 96–112)
GFR calc Af Amer: >90 mL/min (ref >90)
GFR calc non Af Amer: >90 mL/min (ref >90)
Glucose, Bld: 107 mg/dL — ABNORMAL HIGH (ref 70–99)
Sodium: 135 mEq/L (ref 135–145)
Total Bilirubin: 0.5 mg/dL (ref 0.3–1.2)

## 2011-07-15 LAB — CBC
Hemoglobin: 13.1 g/dL (ref 12.0–15.0)
MCH: 28.4 pg (ref 26.0–34.0)
MCV: 84.8 fL (ref 78.0–100.0)
Platelets: 178 10*3/uL (ref 150–400)
RBC: 4.61 MIL/uL (ref 3.87–5.11)
WBC: 4.1 10*3/uL (ref 4.0–10.5)

## 2011-07-15 LAB — LIPASE, BLOOD: Lipase: 47 U/L (ref 11–59)

## 2011-07-15 NOTE — Pre-Procedure Instructions (Signed)
[address]  07/15/2011   Your procedure is scheduled on:  07/17/11  Report to [facility] at 630 AM.  Call this number if you have problems the morning of surgery: [phone]   Remember:   Do not eat food:After Midnight.  May have clear liquids: up to 4 Hours before arrival.  Clear liquids include soda, tea, black coffee, apple or grape juice, broth.  Take these medicines the morning of surgery with A SIP OF WATER: verapamil   Do not wear jewelry, make-up or nail polish.  Do not wear lotions, powders, or perfumes. You may wear deodorant.  Do not shave 48 hours prior to surgery.  Do not bring valuables to the hospital.  Contacts, dentures or bridgework may not be worn into surgery.  Leave suitcase in the car. After surgery it may be brought to your room.  For patients admitted to the hospital, checkout time is 11:00 AM the day of discharge.   Patients discharged the day of surgery will not be allowed to drive home.  Name and phone number of your driver: family  Special Instructions: CHG Shower Use Special Wash: 1/2 bottle night before surgery and 1/2 bottle morning of surgery.   Please read over the following fact sheets that you were given: Pain Booklet, MRSA Information and Surgical Site Infection Prevention

## 2011-07-15 NOTE — Progress Notes (Signed)
Echo in epic. Will call dr Antoine Poche for stess test 3 yrs ago.

## 2011-07-16 MED ORDER — CIPROFLOXACIN IN D5W 400 MG/200ML IV SOLN
400.0000 mg | INTRAVENOUS | Status: AC
  Administered 2011-07-17: 400 mg via INTRAVENOUS
  Filled 2011-07-16: qty 200

## 2011-07-16 NOTE — Progress Notes (Signed)
Phone contact made /w Kim in Med. [facility]. [place]< no stress test found, reviewed records back to 2008.

## 2011-07-17 ENCOUNTER — Encounter (HOSPITAL_COMMUNITY): Payer: Self-pay | Admitting: *Deleted

## 2011-07-17 ENCOUNTER — Other Ambulatory Visit (INDEPENDENT_AMBULATORY_CARE_PROVIDER_SITE_OTHER): Payer: Self-pay | Admitting: Surgery

## 2011-07-17 ENCOUNTER — Encounter (HOSPITAL_COMMUNITY)
Admission: RE | Disposition: A | Discharge status: Home / Self Care | Payer: Self-pay | Source: Ambulatory Visit | Attending: Surgery

## 2011-07-17 ENCOUNTER — Ambulatory Visit (HOSPITAL_COMMUNITY): Payer: BC Managed Care – PPO | Admitting: Critical Care Medicine

## 2011-07-17 ENCOUNTER — Encounter (HOSPITAL_COMMUNITY): Payer: Self-pay | Admitting: Critical Care Medicine

## 2011-07-17 ENCOUNTER — Ambulatory Visit (HOSPITAL_COMMUNITY)
Admission: RE | Admit: 2011-07-17 | Discharge: 2011-07-17 | Disposition: A | Discharge status: Home / Self Care | Payer: BC Managed Care – PPO | Source: Ambulatory Visit | Attending: Surgery | Admitting: Surgery

## 2011-07-17 ENCOUNTER — Ambulatory Visit (HOSPITAL_COMMUNITY): Payer: BC Managed Care – PPO

## 2011-07-17 DIAGNOSIS — Z01812 Encounter for preprocedural laboratory examination: Secondary | ICD-10-CM | POA: Insufficient documentation

## 2011-07-17 DIAGNOSIS — B1920 Unspecified viral hepatitis C without hepatic coma: Secondary | ICD-10-CM | POA: Insufficient documentation

## 2011-07-17 DIAGNOSIS — K8020 Calculus of gallbladder without cholecystitis without obstruction: Secondary | ICD-10-CM

## 2011-07-17 DIAGNOSIS — K811 Chronic cholecystitis: Secondary | ICD-10-CM

## 2011-07-17 DIAGNOSIS — Z87891 Personal history of nicotine dependence: Secondary | ICD-10-CM | POA: Insufficient documentation

## 2011-07-17 DIAGNOSIS — Z01818 Encounter for other preprocedural examination: Secondary | ICD-10-CM | POA: Insufficient documentation

## 2011-07-17 DIAGNOSIS — I059 Rheumatic mitral valve disease, unspecified: Secondary | ICD-10-CM | POA: Insufficient documentation

## 2011-07-17 DIAGNOSIS — I10 Essential (primary) hypertension: Secondary | ICD-10-CM | POA: Insufficient documentation

## 2011-07-17 DIAGNOSIS — J45909 Unspecified asthma, uncomplicated: Secondary | ICD-10-CM | POA: Insufficient documentation

## 2011-07-17 DIAGNOSIS — K8010 Calculus of gallbladder with chronic cholecystitis without obstruction: Secondary | ICD-10-CM | POA: Insufficient documentation

## 2011-07-17 HISTORY — PX: CHOLECYSTECTOMY: SHX55

## 2011-07-17 SURGERY — LAPAROSCOPIC CHOLECYSTECTOMY WITH INTRAOPERATIVE CHOLANGIOGRAM
Anesthesia: General | Site: Abdomen | Wound class: Clean Contaminated

## 2011-07-17 MED ORDER — SODIUM CHLORIDE 0.9 % IJ SOLN: 3.0000 mL | Freq: Two times a day (BID) | INTRAMUSCULAR | Status: DC

## 2011-07-17 MED ORDER — SODIUM CHLORIDE 0.9 % IV SOLN: 250.0000 mL | INTRAVENOUS | Status: DC | PRN

## 2011-07-17 MED ORDER — FENTANYL CITRATE 0.05 MG/ML IJ SOLN
INTRAMUSCULAR | Status: DC | PRN
  Administered 2011-07-17: 100 ug via INTRAVENOUS
  Administered 2011-07-17 (×3): 50 ug via INTRAVENOUS

## 2011-07-17 MED ORDER — ACETAMINOPHEN 325 MG PO TABS: 650.0000 mg | ORAL_TABLET | ORAL | Status: DC | PRN

## 2011-07-17 MED ORDER — NEOSTIGMINE METHYLSULFATE 1 MG/ML IJ SOLN
INTRAMUSCULAR | Status: DC | PRN
  Administered 2011-07-17: 4 mg via INTRAVENOUS

## 2011-07-17 MED ORDER — IOHEXOL 300 MG/ML  SOLN
INTRAMUSCULAR | Status: DC | PRN
  Administered 2011-07-17: 10 mL

## 2011-07-17 MED ORDER — BUPIVACAINE HCL (PF) 0.25 % IJ SOLN
INTRAMUSCULAR | Status: DC | PRN
  Administered 2011-07-17: 25 mL

## 2011-07-17 MED ORDER — ONDANSETRON HCL 4 MG/2ML IJ SOLN
INTRAMUSCULAR | Status: DC | PRN
  Administered 2011-07-17: 4 mg via INTRAVENOUS

## 2011-07-17 MED ORDER — ACETAMINOPHEN 650 MG RE SUPP: 650.0000 mg | RECTAL | Status: DC | PRN

## 2011-07-17 MED ORDER — LACTATED RINGERS IV SOLN
INTRAVENOUS | Status: DC | PRN
  Administered 2011-07-17 (×2): via INTRAVENOUS

## 2011-07-17 MED ORDER — ROCURONIUM BROMIDE 100 MG/10ML IV SOLN
INTRAVENOUS | Status: DC | PRN
  Administered 2011-07-17: 40 mg via INTRAVENOUS

## 2011-07-17 MED ORDER — LIDOCAINE HCL (CARDIAC) 20 MG/ML IV SOLN
INTRAVENOUS | Status: DC | PRN
  Administered 2011-07-17: 60 mg via INTRAVENOUS

## 2011-07-17 MED ORDER — GLYCOPYRROLATE 0.2 MG/ML IJ SOLN
INTRAMUSCULAR | Status: DC | PRN
  Administered 2011-07-17: .6 mg via INTRAVENOUS

## 2011-07-17 MED ORDER — 0.9 % SODIUM CHLORIDE (POUR BTL) OPTIME
TOPICAL | Status: DC | PRN
  Administered 2011-07-17: 1000 mL

## 2011-07-17 MED ORDER — HYDROCODONE-ACETAMINOPHEN 5-325 MG PO TABS: 1.0000 | ORAL_TABLET | ORAL | Status: DC | PRN

## 2011-07-17 MED ORDER — ONDANSETRON HCL 4 MG/2ML IJ SOLN
4.0000 mg | Freq: Once | INTRAMUSCULAR | Status: AC | PRN
  Administered 2011-07-17: 4 mg via INTRAVENOUS

## 2011-07-17 MED ORDER — EPHEDRINE SULFATE 50 MG/ML IJ SOLN
INTRAMUSCULAR | Status: DC | PRN
  Administered 2011-07-17: 5 mg via INTRAVENOUS

## 2011-07-17 MED ORDER — HYDROMORPHONE HCL PF 2 MG/ML IJ SOLN: 2.0000 mg | INTRAMUSCULAR | Status: DC | PRN

## 2011-07-17 MED ORDER — SODIUM CHLORIDE 0.9 % IR SOLN
Status: DC | PRN
  Administered 2011-07-17: 500 mL

## 2011-07-17 MED ORDER — ONDANSETRON HCL 4 MG/2ML IJ SOLN: 4.0000 mg | Freq: Four times a day (QID) | INTRAMUSCULAR | Status: DC | PRN

## 2011-07-17 MED ORDER — PROMETHAZINE HCL 25 MG/ML IJ SOLN: 12.5000 mg | Freq: Four times a day (QID) | INTRAMUSCULAR | Status: DC | PRN

## 2011-07-17 MED ORDER — DEXAMETHASONE SODIUM PHOSPHATE 4 MG/ML IJ SOLN
INTRAMUSCULAR | Status: DC | PRN
  Administered 2011-07-17: 4 mg via INTRAVENOUS

## 2011-07-17 MED ORDER — SODIUM CHLORIDE 0.9 % IJ SOLN: 3.0000 mL | INTRAMUSCULAR | Status: DC | PRN

## 2011-07-17 MED ORDER — HYDROCODONE-ACETAMINOPHEN 5-325 MG PO TABS: 1.0000 | ORAL_TABLET | ORAL | Status: AC | PRN

## 2011-07-17 MED ORDER — OXYCODONE HCL 5 MG PO TABS: 5.0000 mg | ORAL_TABLET | ORAL | Status: DC | PRN

## 2011-07-17 MED ORDER — PROPOFOL 10 MG/ML IV EMUL
INTRAVENOUS | Status: DC | PRN
  Administered 2011-07-17: 200 mg via INTRAVENOUS

## 2011-07-17 MED ORDER — HYDROMORPHONE HCL PF 1 MG/ML IJ SOLN
0.2500 mg | INTRAMUSCULAR | Status: DC | PRN
Start: 2011-07-17 — End: 2011-07-17

## 2011-07-17 SURGICAL SUPPLY — 48 items
ADH SKN CLS APL DERMABOND .7 (GAUZE/BANDAGES/DRESSINGS) ×1
ADH SKN CLS LQ APL DERMABOND (GAUZE/BANDAGES/DRESSINGS) ×1
APPLIER CLIP ROT 10 11.4 M/L (STAPLE) ×2
APR CLP MED LRG 11.4X10 (STAPLE) ×1
BAG SPEC RTRVL LRG 6X4 10 (ENDOMECHANICALS) ×1
BLADE SURG ROTATE 9660 (MISCELLANEOUS) IMPLANT
CANISTER SUCTION 2500CC (MISCELLANEOUS) ×2 IMPLANT
CHLORAPREP W/TINT 26ML (MISCELLANEOUS) ×2 IMPLANT
CLIP APPLIE ROT 10 11.4 M/L (STAPLE) ×1 IMPLANT
CLOTH BEACON ORANGE TIMEOUT ST (SAFETY) ×2 IMPLANT
COVER MAYO STAND STRL (DRAPES) IMPLANT
COVER SURGICAL LIGHT HANDLE (MISCELLANEOUS) ×2 IMPLANT
DECANTER SPIKE VIAL GLASS SM (MISCELLANEOUS) ×2 IMPLANT
DERMABOND ADHESIVE PROPEN (GAUZE/BANDAGES/DRESSINGS) ×1
DERMABOND ADVANCED (GAUZE/BANDAGES/DRESSINGS) ×1
DERMABOND ADVANCED .7 DNX12 (GAUZE/BANDAGES/DRESSINGS) ×1 IMPLANT
DERMABOND ADVANCED .7 DNX6 (GAUZE/BANDAGES/DRESSINGS) ×1 IMPLANT
DRAPE C-ARM 42X72 X-RAY (DRAPES) IMPLANT
DRAPE UTILITY 15X26 W/TAPE STR (DRAPE) ×4 IMPLANT
ELECT REM PT RETURN 9FT ADLT (ELECTROSURGICAL) ×2
ELECTRODE REM PT RTRN 9FT ADLT (ELECTROSURGICAL) ×1 IMPLANT
FILTER SMOKE EVAC LAPAROSHD (FILTER) IMPLANT
GLOVE BIOGEL PI IND STRL 7.0 (GLOVE) IMPLANT
GLOVE BIOGEL PI INDICATOR 7.0 (GLOVE) ×1
GLOVE ECLIPSE 6.5 STRL STRAW (GLOVE) ×1 IMPLANT
GLOVE EUDERMIC 7 POWDERFREE (GLOVE) ×2 IMPLANT
GLOVE SURG SIGNA 7.5 PF LTX (GLOVE) ×2 IMPLANT
GLOVE SURG SS PI 6.5 STRL IVOR (GLOVE) ×1 IMPLANT
GOWN PREVENTION PLUS XLARGE (GOWN DISPOSABLE) ×2 IMPLANT
GOWN STRL NON-REIN LRG LVL3 (GOWN DISPOSABLE) ×5 IMPLANT
KIT BASIN OR (CUSTOM PROCEDURE TRAY) ×2 IMPLANT
KIT ROOM TURNOVER OR (KITS) ×2 IMPLANT
NS IRRIG 1000ML POUR BTL (IV SOLUTION) ×2 IMPLANT
PAD ARMBOARD 7.5X6 YLW CONV (MISCELLANEOUS) ×4 IMPLANT
POUCH SPECIMEN RETRIEVAL 10MM (ENDOMECHANICALS) ×2 IMPLANT
SCISSORS LAP 5X35 DISP (ENDOMECHANICALS) ×2 IMPLANT
SET CHOLANGIOGRAPH 5 50 .035 (SET/KITS/TRAYS/PACK) IMPLANT
SET IRRIG TUBING LAPAROSCOPIC (IRRIGATION / IRRIGATOR) ×2 IMPLANT
SLEEVE Z-THREAD 5X100MM (TROCAR) ×2 IMPLANT
SPECIMEN JAR SMALL (MISCELLANEOUS) ×2 IMPLANT
SUT MNCRL AB 4-0 PS2 18 (SUTURE) ×2 IMPLANT
TOWEL OR 17X24 6PK STRL BLUE (TOWEL DISPOSABLE) ×2 IMPLANT
TOWEL OR 17X26 10 PK STRL BLUE (TOWEL DISPOSABLE) ×2 IMPLANT
TRAY LAPAROSCOPIC (CUSTOM PROCEDURE TRAY) ×2 IMPLANT
TROCAR XCEL BLUNT TIP 100MML (ENDOMECHANICALS) ×2 IMPLANT
TROCAR Z-THREAD FIOS 11X100 BL (TROCAR) ×2 IMPLANT
TROCAR Z-THREAD FIOS 5X100MM (TROCAR) ×2 IMPLANT
WATER STERILE IRR 1000ML POUR (IV SOLUTION) IMPLANT

## 2011-07-17 NOTE — Transfer of Care (Signed)
Immediate Anesthesia Transfer of Care Note  Patient: [name]  Procedure(s) Performed:  LAPAROSCOPIC CHOLECYSTECTOMY WITH INTRAOPERATIVE CHOLANGIOGRAM  Patient Location: PACU  Anesthesia Type: General  Level of Consciousness: awake, alert  and oriented  Airway & Oxygen Therapy: Patient connected to face mask oxygen  Post-op Assessment: Report given to PACU RN, Post -op Vital signs reviewed and stable and Patient moving all extremities X 4  Post vital signs: Reviewed and stable  Complications: No apparent anesthesia complications

## 2011-07-17 NOTE — H&P (View-Only) (Signed)
NAME: [name]                                                                                      DOB: 01/27/1951 DATE: 06/18/2011               MRN: [id number]   CC:  Chief Complaint  Patient presents with  . Abdominal Pain    Eval gallbladder    HPI:  [name] is a 60 y.o.  female who was referred  by Dr. Duncanfor evaluation of I think we should do an excisional biopsy to rule out an early breast cancer. Although she does have significant risk factors I think this be done under IV sedation and local with relatively low risk. Began a guidewire placed her we could. It was included in the biopsy as well some of the subareolar tissue to be sure we clear all the ectatic ducts.  I've reviewed the plans for the patient and she understands and agrees and would like to proceed. I told her we will need to have a cardiology evaluation preoperatively to make sure there no other risks that need to be managed at a time and to help us decide about how to manage her Coumadin perioperatively.  PMH:  has a past medical history of Hypertrophic cardiomyopathy; Hypertension; Substance abuse; Gallstones (06/18/2011); Asthma; Generalized headaches; and Cough.  PSH:   has past surgical history that includes Oophorectomy (1980); doppler echocardiography (04/20/01); nm myoview ltd (02/09/03); Colonoscopy (5/05); Myomectomy (10/09/99); Holter; NSVD; Esophagogastroduodenoscopy (06/09/06); Ganglionectomy; Thumb Repair (1996); Breast reduction surgery (2001); Colonoscopy Divertuics (03/16/2007); Colonoscopy (08/03/2010); Partial hysterectomy (2002); Ovarian cyst drainage (1980); Colonoscopy (08/10/98); Breast surgery (2001); and lump removed (1964).  ALLERGIES:   Allergies  Allergen Reactions  . Morphine Sulfate Swelling    Of face and tongue.  . Penicillins Swelling    Of face and tongue.    MEDICATIONS: Current outpatient prescriptions:hydrocortisone (ANUSOL-HC) 25 MG suppository, as needed., Disp: , Rfl: ;   TURMERIC PO, Take 1 capsule by mouth daily.  , Disp: , Rfl: ;  verapamil (CALAN-SR) 240 MG CR tablet, TAKE 1 TABLET BY MOUTH ONCE A DAY, Disp: 30 tablet, Rfl: 11  ROS: She has filled out our 12 point review of systems and it is negative except for cough (getting over the flu) and mild headaches.   EXAM:   GENERAL:  The patient is alert, oriented, and generally healthy-appearing, NAD. Mood and affect are normal.  HEENT:  The head is normocephalic, the eyes nonicteric, the pupils were round regular and equal. EOMs are normal. Pharynx normal. Dentition good.  NECK:  The neck is supple and there are no masses or thyromegaly.  LUNGS: Normal respirations and clear to auscultation.  HEART: Regular rhythm, with no murmurs rubs or gallops. Pulses are intact carotid dorsalis pedis and posterior tibial. No significant varicosities are noted.  ABDOMEN: Soft, flat, and nontender. No masses or organomegaly is noted. No hernias are noted. Bowel sounds are normal.  EXTREMITIES:  Good range of motion, no edema.  DATA REVIEWED:  Epic notes and the ultrasound report are reviewed  IMPRESSION:  Gallstones. Possibly causing her current   symptoms  PLAN:   Cholecystectomy. I have discussed the indications for laparoscopic cholecystectomy with her and provided educational material. We have discussed the risks of surgery, including general risks such as bleeding, infection, lung and heart issues etc. We have also discussed the potential for injuries to other organs, bile duct leaks, and other unexpected events. We have also talked about the fact that this may need to be converted to open under certain circumstances. We discussed the typical post op recovery and the fact that there is a moderate likelihood of improvement in symptoms and return to normal activity.  She understands this and wishes to proceed to schedule surgery. I believe all of .his questions have been answered.     Patricia Borba  Prince 06/18/2011  CC: Duncan, Graham S, MD, Graham Duncan, MD, MD        

## 2011-07-17 NOTE — Interval H&P Note (Signed)
History and Physical Interval Note:  07/17/2011 7:55 AM  [name]  has presented today for surgery, with the diagnosis of gallstones  The various methods of treatment have been discussed with the patient and family. After consideration of risks, benefits and other options for treatment, the patient has consented to  Procedure(s): LAPAROSCOPIC CHOLECYSTECTOMY WITH INTRAOPERATIVE CHOLANGIOGRAM as a surgical intervention .  The patients' history has been reviewed, patient examined, no change in status, stable for surgery.  I have reviewed the patients' chart and labs.  Questions were answered to the patient's satisfaction.    She anticipates going home today.  Note that the original H&P has the wrong patients history as noted above. The patient has intermittent mild epigastric discomfort, and no severe colicky episodes. These have been going on for a long time and she is ready to think about having her gall bladder removed  [name] J

## 2011-07-17 NOTE — Anesthesia Postprocedure Evaluation (Signed)
  Anesthesia Post-op Note  Patient: [name]  Procedure(s) Performed:  LAPAROSCOPIC CHOLECYSTECTOMY WITH INTRAOPERATIVE CHOLANGIOGRAM  Patient Location: PACU  Anesthesia Type: General  Level of Consciousness: awake, alert , oriented and patient cooperative  Airway and Oxygen Therapy: Patient Spontanous Breathing and Patient connected to nasal cannula oxygen  Post-op Pain: mild  Post-op Assessment: Post-op Vital signs reviewed, Patient's Cardiovascular Status Stable, Respiratory Function Stable, Patent Airway, No signs of Nausea or vomiting and Pain level controlled  Post-op Vital Signs: stable  Complications: No apparent anesthesia complications

## 2011-07-17 NOTE — Op Note (Signed)
[name] Feb 10, 1951 [id number] 06/18/2011  Preoperative diagnosis: chronic calculus cholecystitis  Postoperative diagnosis: same  Procedure:laparoscopic cholecystectomy with intraoperative cholangiogram  Surgeon: Currie Paris, MD, FACS  Assistant surgeon: Dr. Abigail Miyamoto   Anesthesia:General  Clinical History and Indications: This patient has known gallstones and comes in today for cholecystectomy.  Description of procedure: The patient was seen in the preoperative area. I reviewed the plans for the procedure with her as well as the risks and complications. She had no further questions.  The patient was taken to the operating room. After satisfactory general endotracheal anesthesia had been obtained the abdomen was prepped and draped. A time out was done.  0.25% plain Marcaine was used at all incisions. I made an umbilical incision, identified the fascia and opened that, and entered the peritoneal cavity under direct vision. A 0 Vicryl pursestring suture was placed and the Hasson cannula was introduced under direct vision and secured with the pursestring. The abdomen was inflated to 15 cm.  The camera was placed and there were no gross abnormalities.and we noted numerous omental adhesions. These were all around the umbilical incision I was able to get the camera around on the right side into the epigastric area and under direct vision placed a 1011 trocar.. The patient was then placed in reverse Trendelenburg and tilted to the left. A 5 mm trocar was placed under direct vision in the right upper quadrant. With the camera in the epigastric port and scissors through the right upper quadrant port the omental adhesions around the Hasso nn cannula Were Taken down .once this was freed a second 5 mm trocar was placed in the right upper quadrant.  There were virtually no lesions around the gallbladder. I opened the peritoneum and the triangle of Calot and dissected out a long  segment of cystic duct identifying its junction with the gallbladder and common duct. Also a segment of cystic artery was dissected out and a window created so I had a critical view.   An intraoperative cholangiogram was then performed. A Cook catheter was introduced percutaneously and placed in the cystic duct. The cholangiogram showed good filling of the common duct and hepatic radicals and free flow into the duodenum. No abnormalities were noted.  The catheter was removed and 3 clips placed on the stay side of the cystic duct. The duct was then divided.  Additional clips are placed on the cystic artery and it was divided. The gallbladder was then removed from below to above the coagulation current of the cautery. It was then placed in a bag to be retrieved later.  The abdomen was irrigated and a check for hemostasis along the bed of the gallbladder made. We also checked around the omental adhesions that were taken down. Once everything appeared to be dry we were able to move the camera to the epigastric port and removed the gallbladder through the umbilical port.  The abdomen was reinsufflated and a final check for hemostasis made. There is no evidence of bleeding or bile leakage. The lateral ports were removed under direct vision and there was no bleeding. The umbilical site was closed with a pursestring, watching with the camera in the epigastric port. The abdomen was then deflated through the epigastric port and that was removed. Skin was closed with 4-0 Monocryl subcuticular and Dermabond.  The patient tolerated the procedure well. There were no operative complications. EBL was minimal. All counts were correct.  Currie Paris, MD, FACS 07/17/2011 9:24 AM

## 2011-07-17 NOTE — Anesthesia Procedure Notes (Signed)
Procedure Name: Intubation Date/Time: 07/17/2011 8:32 AM Performed by: Elon Alas Pre-anesthesia Checklist: Timeout performed, Emergency Drugs available, Patient identified, Suction available and Patient being monitored Patient Re-evaluated:Patient Re-evaluated prior to inductionOxygen Delivery Method: Circle System Utilized Preoxygenation: Pre-oxygenation with 100% oxygen Intubation Type: IV induction and Cricoid Pressure applied Ventilation: Mask ventilation without difficulty and Oral airway inserted - appropriate to patient size Laryngoscope Size: Mac and 3 Grade View: Grade I Tube type: Oral Tube size: 7.0 mm Number of attempts: 1 Airway Equipment and Method: stylet Placement Confirmation: ETT inserted through vocal cords under direct vision,  breath sounds checked- equal and bilateral and positive ETCO2 Secured at: 21 cm Tube secured with: Tape Dental Injury: Teeth and Oropharynx as per pre-operative assessment

## 2011-07-17 NOTE — Preoperative (Signed)
Beta Blockers   Reason not to administer Beta Blockers:Not Applicable 

## 2011-07-17 NOTE — Progress Notes (Signed)
Report given to stephanie rn as caregiver 

## 2011-07-17 NOTE — Anesthesia Preprocedure Evaluation (Addendum)
Anesthesia Evaluation  Patient identified by MRN, date of birth, ID band Patient awake    Reviewed: Allergy & Precautions, H&P , NPO status , Patient's Chart, lab work & pertinent test results  Airway Mallampati: I TM Distance: >3 FB Neck ROM: Full    Dental  (+) Teeth Intact and Dental Advisory Given   Pulmonary asthma , former smoker         Cardiovascular hypertension, Pt. on medications + Valvular Problems/Murmurs (aortic regurge) MR  Cardiomyopathy, echo ef 55-60%, mild LVH, normal systolic function   Neuro/Psych    GI/Hepatic (+) Hepatitis -, C  Endo/Other    Renal/GU      Musculoskeletal   Abdominal   Peds  Hematology   Anesthesia Other Findings   Reproductive/Obstetrics                         Anesthesia Physical Anesthesia Plan  ASA: II  Anesthesia Plan: General   Post-op Pain Management:    Induction: Intravenous  Airway Management Planned: Oral ETT  Additional Equipment:   Intra-op Plan:   Post-operative Plan:   Informed Consent: I have reviewed the patients History and Physical, chart, labs and discussed the procedure including the risks, benefits and alternatives for the proposed anesthesia with the patient or authorized representative who has indicated his/her understanding and acceptance.     Plan Discussed with: CRNA, Anesthesiologist and Surgeon  Anesthesia Plan Comments:         Anesthesia Quick Evaluation

## 2011-07-18 ENCOUNTER — Encounter (HOSPITAL_COMMUNITY): Payer: Self-pay | Admitting: Surgery

## 2011-07-23 ENCOUNTER — Other Ambulatory Visit (INDEPENDENT_AMBULATORY_CARE_PROVIDER_SITE_OTHER): Payer: Self-pay | Admitting: General Surgery

## 2011-07-23 NOTE — Telephone Encounter (Signed)
Hydrocodone 5/325 #30 with no refills called to [facility].

## 2011-07-24 ENCOUNTER — Ambulatory Visit: Payer: BC Managed Care – PPO

## 2011-07-25 ENCOUNTER — Ambulatory Visit (INDEPENDENT_AMBULATORY_CARE_PROVIDER_SITE_OTHER): Payer: BC Managed Care – PPO | Admitting: *Deleted

## 2011-07-25 DIAGNOSIS — Z23 Encounter for immunization: Secondary | ICD-10-CM

## 2011-08-02 ENCOUNTER — Ambulatory Visit (INDEPENDENT_AMBULATORY_CARE_PROVIDER_SITE_OTHER): Payer: BC Managed Care – PPO | Admitting: Surgery

## 2011-08-02 ENCOUNTER — Encounter (INDEPENDENT_AMBULATORY_CARE_PROVIDER_SITE_OTHER): Payer: Self-pay | Admitting: Surgery

## 2011-08-02 VITALS — BP 122/86 | HR 68 | Temp 97.3°F | Resp 16 | Ht 65.0 in | Wt 190.6 lb

## 2011-08-02 DIAGNOSIS — Z9889 Other specified postprocedural states: Secondary | ICD-10-CM

## 2011-08-02 NOTE — Progress Notes (Signed)
Patient left without being seen.

## 2011-09-26 ENCOUNTER — Ambulatory Visit (INDEPENDENT_AMBULATORY_CARE_PROVIDER_SITE_OTHER): Payer: BC Managed Care – PPO | Admitting: Gastroenterology

## 2011-09-26 DIAGNOSIS — B182 Chronic viral hepatitis C: Secondary | ICD-10-CM

## 2011-09-26 LAB — IBC PANEL: %SAT: 37 % (ref 20–55)

## 2011-09-26 LAB — COMPLETE METABOLIC PANEL WITH GFR
AST: 34 U/L (ref 0–37)
BUN: 4 mg/dL — ABNORMAL LOW (ref 6–23)
CO2: 29 mEq/L (ref 19–32)
Calcium: 9.8 mg/dL (ref 8.4–10.5)
Chloride: 104 mEq/L (ref 96–112)
Creat: 0.61 mg/dL (ref 0.50–1.10)
GFR, Est African American: >89 mL/min
GFR, Est Non African American: >89 mL/min

## 2011-09-26 LAB — CBC WITH DIFFERENTIAL/PLATELET
Lymphocytes Relative: 54 % — ABNORMAL HIGH (ref 12–46)
Lymphs Abs: 2.7 10*3/uL (ref 0.7–4.0)
Neutro Abs: 1.7 10*3/uL (ref 1.7–7.7)
Neutrophils Relative %: 34 % — ABNORMAL LOW (ref 43–77)
Platelets: 229 10*3/uL (ref 150–400)
RBC: 4.9 MIL/uL (ref 3.87–5.11)
WBC: 5 10*3/uL (ref 4.0–10.5)

## 2011-09-26 LAB — IRON: Iron: 141 ug/dL (ref 42–145)

## 2011-09-27 LAB — PROTIME-INR

## 2011-10-01 ENCOUNTER — Other Ambulatory Visit: Payer: Self-pay | Admitting: Gastroenterology

## 2011-10-01 LAB — PROTIME-INR: Prothrombin Time: 13.4 seconds (ref 11.6–15.2)

## 2011-10-11 NOTE — Progress Notes (Signed)
[name], [name]  MR#:  [id number]      DATE:  09/26/2011  DOB:  12/19/1950    cc: Consulting Physician:  Rollene Rotunda, MD, [facility], [address], [address], [place], [place], Phone [phone] Referring Physician:  Jaci Carrel, MD, [facility] [place], [address] [place], [place], [place] [address], [place] [phone], Fax [phone]    reason for referral:  Genotype 1a HCV.  history: The patient is a 61 year old woman who I have been asked to see in consultation by Dr. Para March regarding genotype 1a hepatitis C.   The patient was unaware of any history of liver disease, but looking through EPIC, I note that liver tests on 01/29/2007, were abnormal and so were subsequent determinations. I gather when this was recognized on 05/27/2011, her hepatitis B surface antigen was tested and negative, but her hepatitis C antibody was positive. This subsequently led to genotyping her on 06/13/2011, showing that she was type 1a. There are no symptoms referable to her history of hepatitis C. There are no symptoms to suggest cryoglobulin mediated or decompensated liver disease.   With respect to risk factors for liver disease, she drinks a glass of wine approximately once a week until last month when she stopped all together. There is no more significant alcohol use than that in the past. There is no history of tattoos or unsterile body piercing. She recalls using cocaine over 25 years ago. She may have had a blood transfusion in the past, prior to 1992, but she cannot recall for certain. Her family history is significant for a brother who has hepatitis C and has not been treated as of yet. She is receiving hepatitis A and B vaccinations though only see that she is B surface antibody negative on 06/04/2011 and the hepatitis A antibody was not tested.   past medical history:  Otherwise significant for hypertrophic cardiomyopathy for which she sees [facility]. On EPIC there is an echocardiogram from 01/29/2011, showing that there was no significant left ventricular outflow obstruction and the cavity size was normal. There was mild LVH. Her ejection fraction was 55% to 65% without regional wall motion abnormalities. Similarly, the estimated right ventricular pressure was 27 mmHg. There is no diabetes, dyslipidemia, or coronary artery disease, hypertension.  past surgical history: Removal of a cyst from right foot. In 1980 underwent hemorrhoidectomy, 1988 ovarian cyst resection, 2000 breast reduction, 2001 second ovarian resection with hysterectomy, cholecystectomy January 2013 for abdominal pain.   past psychiatric history: Denies.  current medications: Verapamil 240 mg daily.  Allergies: penicillin and morphine cause swelling.  habits: Smoking, quit 6 years ago. Alcohol as above.  family history: As above.   social history: Single. Two children. She works as a Technical brewer for [employer].   REVIEW OF SYSTEMS: All 10 systems reviewed today on the review of systems form, which was signed and placed in the chart. CES-D was 1.  She exercises approximately 20 minutes a day doing aerobics and walks 2 miles a day.   PHYSICAL EXAMINATION: Constitutional: Well-appearing without stigmata of chronic liver disease or significant peripheral wasting. Vital Signs: Height 65 inches, weight 190 pounds, blood pressure 125/87, pulse 89, temperature 97 Fahrenheit. Ears, nose, mouth and throat:  Unremarkable oropharynx.  No thyromegaly or neck masses.  Chest:  Resonant to percussion.  Clear to auscultation.  Cardiovascular:  Heart sounds normal S1, S2 without murmurs or rubs.  There is no peripheral edema.  Abdominal:  Normal bowel sounds.  No masses or tenderness.  I could not appreciate a liver edge or spleen tip.  I could not appreciate any hernias.  Lymphatics:  No cervical or inguinal lymphadenopathy.  Central Nervous System:  No asterixis or  focal neurologic findings.  Dermatologic:  Anicteric without palmar erythema or spider angiomata.  Eyes:  Anicteric sclerae.  Pupils are equal and reactive to light.    LABORATORIES: I have reviewed her previous lab work within [facility]. It is significant for her genotype 1a on 06/13/2011. Her HCV RNA at the time was 248,000 international units per mL. ON the same day her platelet count was 178, and her albumin was 3.8, creatinine 0.61, total bilirubin 0.5, ALT 44, AST 37.   An ultrasound on 05/27/2011, showed the liver was unremarkable. Spleen size was normal.   assessment: The patient is a 61 year old woman with a history of genotype 1a hepatitis C who is clinically and biochemically well compensated. She appears to be an excellent candidate for therapy. I do not think her cardiomyopathy history is such that that would preclude her from being considered for treatment.   Today we discussed the nature and natural history of genotype 1a hepatitis C. I explained that the disease is best assessed with biopsy. I explained how this is done in radiology at [facility]. We then discussed treatment with PEG interferon and ribavirin and protease inhibitor. I reviewed the treatment protocol for our clinic. I reviewed the response rates. I reviewed the specific systems, constitutional, and psychiatric side effects of therapy. We also discussed the possibility of waiting for further developments in the field of hepatitis C therapy, if she does not have significant fibrosis on her biopsy. We also discussed the risks of contagion.   I then discussed the possibility of participating in clinical trials. I explained that these trials are voluntary. I have explained these agents are not FDA approved. I have explained that we do not know the full efficacy and side effect profiles of the medications in the studies. She was interested in participating in trials. The patient met today with Brooke Dare, the research coordinator,  who works with me, who was in the office today.   plan: 1. Will arrange for biopsy in the coming weeks. 2. Will check for hepatitis A immunity. 3. Standard labs.  4. She is undergoing immunization against hepatitis A and B.  5. I have referred her to [email or web address] for information. 6. Follow up will be dependent on the results of the biopsy.     7. She has met today with the research coordinator who will take her name down for a protocol that possibly may be opening in the next 6 weeks.                   Brooke Dare, MD   ADDENDUM:  Hepatitis A immune.  [id number] .[id number]  D:  Thu Mar 21 20:32:15 2013 ; T:  Thu Mar 21 21:23:30 2013  Job #:  [id number]

## 2011-11-14 ENCOUNTER — Ambulatory Visit (INDEPENDENT_AMBULATORY_CARE_PROVIDER_SITE_OTHER): Payer: BC Managed Care – PPO | Admitting: Family Medicine

## 2011-11-14 ENCOUNTER — Encounter: Payer: Self-pay | Admitting: Family Medicine

## 2011-11-14 VITALS — BP 116/76 | HR 64 | Temp 98.0°F | Wt 187.8 lb

## 2011-11-14 DIAGNOSIS — R109 Unspecified abdominal pain: Secondary | ICD-10-CM

## 2011-11-14 DIAGNOSIS — N760 Acute vaginitis: Secondary | ICD-10-CM

## 2011-11-14 NOTE — Progress Notes (Signed)
Saw hepatology clinic for HCV and is awaiting biopsy.  Had cholecystectomy in 07/2011.  Still with LUQ abd pain.  Cut out dairy and meat, with transient relief for about 1-2 months but then sx returned (though not as bad as prev).  If she's eating fruit and vegetables she does well.  Pain is intermittent.  No vomiting, no diarrhea.  No blood in stool, hematuria, bruising.  No weight loss that was unintended.    Vaginitis, no discharge but burning on the outside and irritated skin.  She used hydrocortisone topically and it got better.  She doesn't have symptoms now to check.  S/p hysterectomy.    Meds, vitals, and allergies reviewed.   ROS: See HPI.  Otherwise, noncontributory.  GEN: nad, alert and oriented HEENT: mucous membranes moist NECK: supple w/o LA CV: rrr. PULM: ctab, no inc wob ABD: soft, +bs, not ttp, normal BS, no masses.  EXT: no edema SKIN: no acute rash

## 2011-11-14 NOTE — Patient Instructions (Addendum)
Take zantac 75mg  twice a day and see if that helps.   Take 1000 units of vit D a day. Use over the counter hydrocortisone cream as needed.  If recurrent, then come in when you have a spot for me to check.  Take care.

## 2011-11-18 DIAGNOSIS — N760 Acute vaginitis: Secondary | ICD-10-CM | POA: Insufficient documentation

## 2011-11-18 NOTE — Assessment & Plan Note (Addendum)
Resolved, if she has more symptoms then she'll notify me.  Can use otc hydrocortisone sparingly with steroid caution.

## 2011-11-18 NOTE — Assessment & Plan Note (Signed)
With benign exam, it would be reasonable to try zantac given the location of the discomfort and then f/u prn.  D/w pt. No red flag sx per history or exam.

## 2011-12-24 ENCOUNTER — Other Ambulatory Visit: Payer: Self-pay | Admitting: Radiology

## 2011-12-25 ENCOUNTER — Other Ambulatory Visit: Payer: Self-pay | Admitting: Radiology

## 2011-12-26 ENCOUNTER — Encounter (HOSPITAL_COMMUNITY): Payer: Self-pay

## 2011-12-26 ENCOUNTER — Ambulatory Visit (HOSPITAL_COMMUNITY)
Admission: RE | Admit: 2011-12-26 | Discharge: 2011-12-26 | Disposition: A | Discharge status: Home / Self Care | Payer: BC Managed Care – PPO | Source: Ambulatory Visit | Attending: Gastroenterology | Admitting: Gastroenterology

## 2011-12-26 VITALS — BP 109/79 | HR 58 | Temp 97.3°F | Resp 18 | Ht 65.0 in | Wt 183.0 lb

## 2011-12-26 DIAGNOSIS — I422 Other hypertrophic cardiomyopathy: Secondary | ICD-10-CM | POA: Insufficient documentation

## 2011-12-26 DIAGNOSIS — B182 Chronic viral hepatitis C: Secondary | ICD-10-CM | POA: Insufficient documentation

## 2011-12-26 DIAGNOSIS — K7460 Unspecified cirrhosis of liver: Secondary | ICD-10-CM | POA: Insufficient documentation

## 2011-12-26 DIAGNOSIS — I10 Essential (primary) hypertension: Secondary | ICD-10-CM | POA: Insufficient documentation

## 2011-12-26 DIAGNOSIS — J45909 Unspecified asthma, uncomplicated: Secondary | ICD-10-CM | POA: Insufficient documentation

## 2011-12-26 LAB — CBC
Hemoglobin: 13.4 g/dL (ref 12.0–15.0)
MCH: 28 pg (ref 26.0–34.0)
MCHC: 33.8 g/dL (ref 30.0–36.0)
MCV: 82.9 fL (ref 78.0–100.0)

## 2011-12-26 LAB — PROTIME-INR: Prothrombin Time: 13.2 seconds (ref 11.6–15.2)

## 2011-12-26 MED ORDER — HYDROCODONE-ACETAMINOPHEN 5-325 MG PO TABS: 1.0000 | ORAL_TABLET | ORAL | Status: DC | PRN

## 2011-12-26 MED ORDER — MIDAZOLAM HCL 2 MG/2ML IJ SOLN
INTRAMUSCULAR | Status: AC
  Filled 2011-12-26: qty 4

## 2011-12-26 MED ORDER — FENTANYL CITRATE 0.05 MG/ML IJ SOLN
INTRAMUSCULAR | Status: AC
  Filled 2011-12-26: qty 4

## 2011-12-26 MED ORDER — SODIUM CHLORIDE 0.9 % IV SOLN: Freq: Once | INTRAVENOUS | Status: DC

## 2011-12-26 MED ORDER — MIDAZOLAM HCL 5 MG/5ML IJ SOLN
INTRAMUSCULAR | Status: DC | PRN
  Administered 2011-12-26 (×2): 1 mg via INTRAVENOUS

## 2011-12-26 MED ORDER — FENTANYL CITRATE 0.05 MG/ML IJ SOLN
INTRAMUSCULAR | Status: DC | PRN
  Administered 2011-12-26: 25 ug via INTRAVENOUS
  Administered 2011-12-26: 50 ug via INTRAVENOUS

## 2011-12-26 NOTE — H&P (Signed)
[name] is an 61 y.o. female.   Chief Complaint: Hep C- dx 10/12 Scheduled for liver core biopsy HPI: HTN; cardiomyopathy  Past Medical History  Diagnosis Date  . Hypertrophic cardiomyopathy   . Substance abuse     history of, sober for years as of 2012  . Gallstones 06/18/2011  . Cough     due to flu  . Asthma     remote  . Hypertension       Dr Antoine Poche cardiac    Dr Para March pcp  in [place]  . HCV (hepatitis C virus)     1a    Past Surgical History  Procedure Date  . Oophorectomy 1980    right  . Doppler echocardiography 04/20/01    Mild LVH TR, MI, TI  . Nm myoview ltd 02/09/03    ETI, EF 72%  . Colonoscopy 5/05    SIGMF H/O colon ca father/aunt, sister with polyps 08/10/98  . Myomectomy 10/09/99    x2 pelvic US  . Holter     22 hrs- slow rhythym 54-116   - 3 ectopics rare svrr vein 02/06/00  . Nsvd     x 2  . Esophagogastroduodenoscopy 06/09/06    microscopically abnormal, Esoph and Gastric bx's  . Ganglionectomy     R foot, 12 YOA  . Thumb repair 1996    R LAC  . Breast reduction surgery 2001  . Colonoscopy divertuics 03/16/2007    SM INT HEMMS Dr. Marva Panda  . 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 Juanda Chance     30yrs  . Partial hysterectomy 2002    with no remaining ovary  . Ovarian cyst drainage 1980  . Colonoscopy 08/10/98    Nml  . Breast surgery 2001    reduction  . Lump removed 1964    right foot  . Cholecystectomy 07/17/2011    Procedure: LAPAROSCOPIC CHOLECYSTECTOMY WITH INTRAOPERATIVE CHOLANGIOGRAM;  Surgeon: Currie Paris, MD;  Location: [facility] OR;  Service: General;  Laterality: N/A;    Family History  Problem Relation Age of Onset  . Colon cancer Father   . Cancer Father     colon  . Diabetes Brother   . Colon cancer      Aunt  . Diabetes Sister   . Hypertension Brother   . Cancer Paternal Aunt     colon  . Cancer Paternal Uncle     lung  . Cancer Cousin     breast   Social History:  reports that she quit smoking  about 6 years ago. Her smoking use included Cigarettes. She has a 80 pack-year smoking history. She has never used smokeless tobacco. She reports that she drinks alcohol. She reports that she uses illicit drugs (Codeine).  Allergies:  Allergies  Allergen Reactions  . Morphine Sulfate Swelling    Of face and tongue.  Marland Kitchen Penicillins Swelling    Of face and tongue.     (Not in a hospital admission)  No results found for this or any previous visit (from the past 48 hour(s)). No results found.  Review of Systems  Constitutional: Negative for fever.  Respiratory: Negative for cough.   Cardiovascular: Negative for chest pain.  Gastrointestinal: Negative for nausea and vomiting.  Neurological: Negative for headaches.    Blood pressure 112/72, pulse 62, temperature 97.4 F (36.3 C), temperature source Oral, resp. rate 18, height 5\' 5"  (1.651 m), weight 183 lb (83.008 kg), SpO2 99.00%. Physical Exam  Constitutional: She is oriented to person, place, and time. She appears well-developed and well-nourished.  Cardiovascular: Normal rate, regular rhythm and normal heart sounds.   No murmur heard. Respiratory: Effort normal and breath sounds normal. She has no wheezes.  GI: Soft. There is no tenderness.  Musculoskeletal: Normal range of motion.  Neurological: She is alert and oriented to person, place, and time.  Psychiatric: She has a normal mood and affect. Her behavior is normal. Judgment and thought content normal.     Assessment/Plan Hep C dx 04/2011 Need for biopsy for possible treatment Scheduled now for liver core bx Pt aware of procedure benefits and risks and agreeable to proceed Consent in chart  Zi Sek A 12/26/2011, 9:51 AM

## 2011-12-26 NOTE — Discharge Instructions (Signed)
Liver Biopsy A liver biopsy is done to confirm or prove a suspected problem. The liver is a large organ in the upper right hand of your abdomen. To do the test, the doctor puts a small needle into the right side of your abdomen. A tiny piece of liver tissue is taken and sent for testing. This should not be painful as the skin is injected with a local anesthetic that numbs the area.  HOW A BIOPSY IS PERFORMED This is often performed as a same day surgery. This can be done in a hospital or clinic. Biopsies are often done under local anesthesia which makes the area of biopsy numb. Sometimes sedation is given to help patients relax. If you are taking blood thinning medications or medications containing aspirin, this must be discussed with your caregiver before the test. This medication may need to be stopped for up to 7 days before the procedure, or the dose may need to be changed. You should review all of your other medications with your caregiver before the test. You must remain in bed for 1 to 2 hours after the test. Having something to read may help pass the time.  LET YOUR CAREGIVERS KNOW ABOUT THE FOLLOWING:  Allergies.   Medications taken including herbs, eye drops, over -the- counter medications, and creams.   Use of steroids (by mouth or creams).   Previous problems with anesthetics or novocaine.   Possibility of pregnancy, if this applies.   History of blood clots (thrombophlebitis).   History of bleeding or blood problems.   Previous surgery.   Other health problems.  BEFORE THE PROCEDURE You should be present 60 minutes prior to your procedure or as directed. Check in at the admissions desk for filling out necessary forms if not pre-registered. There will be consent forms to sign prior to the procedure. There is a waiting area for your family while you are having your biopsy. AFTER THE PROCEDURE  After your biopsy, you will be taken to the recovery area where a nurse will watch  and check your progress.   You may have to lie on your right side for 1 to 2 hours.   Your blood pressure and pulse will be checked often.   If you are having pain or feel sick, tell your nurse.   After 1 to 2 hours, if you are going home, you may sit in a chair and get dressed. The nurse will let you know when you can get up.   Once you are doing well, barring other problems, you will be allowed to go home. Once at home, putting an ice pack on your operative site may help with discomfort and keep swelling down.   You may resume a normal diet and activities as directed.   Change dressings as directed.   Only take over-the-counter or prescription medicines for pain, discomfort, or fever as directed by your caregiver.   Call for your results as instructed by your caregiver. Remember it is your job to be sure you get the results of your biopsy and any additional tests performed on the sample taken. Do not assume everything is fine if you do not hear from your caregiver.  HOME CARE INSTRUCTIONS   You should rest for one to two days or as instructed.   You will need to have a responsible adult take you home and stay with you overnight.   Do not lift over 5 lbs. or play contact sports for two weeks.     Do not drive for 24 hours.   Do not take medication containing aspirin or drink alcohol for one week after this test.  SEEK MEDICAL CARE IF:   There is increased bleeding (more than a small spot) from the biopsy site.   You have redness, swelling, or increasing pain in the biopsy site.   You develop swelling or pain in the abdomen.   You have an unexplained oral temperature over 102 F (38.9 C).   You notice a foul smell coming from the wound or dressing.  SEEK IMMEDIATE MEDICAL CARE IF:   You develop a rash.   You have difficulty breathing.   You have allergic problems such as itching or swelling or shortness of breath.  Document Released: 09/14/2003 Document Revised:  06/13/2011 Document Reviewed: 02/02/2008 ExitCare Patient Information 2012 ExitCare, [place]CLiver Biopsy Care After Refer to this sheet in the next few weeks. These discharge instructions provide you with general information on caring for yourself after you leave the hospital. Your caregiver may also give you specific instructions. Your treatment has been planned according to the most current medical practices available, but unavoidable complications sometimes occur. If you have any problems or questions after discharge, please call your caregiver. HOME CARE INSTRUCTIONS   You should rest for 1 to 2 days or as instructed.   If you go home the same day as your procedure (outpatient), have a responsible adult take you home and stay with you overnight.   Do not lift more than 5 pounds or play contact sports for 2 weeks.   Do not drive for 24 hours after this test.   Do not take medicine containing aspirin or drink alcohol for 1 week after this test.   Change bandages (dressings) as directed.   Only take over-the-counter or prescription medicines for pain, discomfort, or fever as directed by your caregiver.  OBTAINING YOUR TEST RESULTS Not all test results are available during your visit. If your test results are not back during the visit, make an appointment with your caregiver to find out the results. Do not assume everything is normal if you have not heard from your caregiver or the medical facility. It is important for you to follow up on all of your test results. SEEK MEDICAL CARE IF:   You have increased bleeding (more than a small spot) from the biopsy site.   You have redness, swelling, or increasing pain in the biopsy site.   You have an oral temperature above 102 F (38.9 C).  SEEK IMMEDIATE MEDICAL CARE IF:   You develop swelling or pain in the belly (abdomen).   You develop a rash.   You have difficulty breathing, feel short of breath, or feel faint.   You develop any  reaction or side effects to medicines given.  MAKE SURE YOU:   Understand these instructions.   Will watch your condition.   Will get help right away if you are not doing well or get worse.  Document Released: 01/11/2005 Document Revised: 06/13/2011 Document Reviewed: 02/04/2008 [facility] Patient Information 2012 [place], [place].Liver Biopsy Care After Refer to this sheet in the next few weeks. These discharge instructions provide you with general information on caring for yourself after you leave the hospital. Your caregiver may also give you specific instructions. Your treatment has been planned according to the most current medical practices available, but unavoidable complications sometimes occur. If you have any problems or questions after discharge, please call your caregiver. HOME CARE INSTRUCTIONS  You should rest for 1 to 2 days or as instructed.   If you go home the same day as your procedure (outpatient), have a responsible adult take you home and stay with you overnight.   Do not lift more than 5 pounds or play contact sports for 2 weeks.   Do not drive for 24 hours after this test.   Do not take medicine containing aspirin or drink alcohol for 1 week after this test.   Change bandages (dressings) as directed.   Only take over-the-counter or prescription medicines for pain, discomfort, or fever as directed by your caregiver.  OBTAINING YOUR TEST RESULTS Not all test results are available during your visit. If your test results are not back during the visit, make an appointment with your caregiver to find out the results. Do not assume everything is normal if you have not heard from your caregiver or the medical facility. It is important for you to follow up on all of your test results. SEEK MEDICAL CARE IF:   You have increased bleeding (more than a small spot) from the biopsy site.   You have redness, swelling, or increasing pain in the biopsy site.   You have an oral  temperature above 102 F (38.9 C).  SEEK IMMEDIATE MEDICAL CARE IF:   You develop swelling or pain in the belly (abdomen).   You develop a rash.   You have difficulty breathing, feel short of breath, or feel faint.   You develop any reaction or side effects to medicines given.  MAKE SURE YOU:   Understand these instructions.   Will watch your condition.   Will get help right away if you are not doing well or get worse.  Document Released: 01/11/2005 Document Revised: 06/13/2011 Document Reviewed: 02/04/2008 [facility] Patient Information 2012 [place], [place].Marland Kitchen

## 2011-12-26 NOTE — Procedures (Signed)
Procedure : random liver core biopsy Specimen : 18 g cores x 3 meds : 2 mg versed, 75 mcg fentanyl Complications : none immediate  Full report in canopy pacs

## 2011-12-27 ENCOUNTER — Ambulatory Visit (INDEPENDENT_AMBULATORY_CARE_PROVIDER_SITE_OTHER): Payer: BC Managed Care – PPO | Admitting: *Deleted

## 2011-12-27 DIAGNOSIS — Z23 Encounter for immunization: Secondary | ICD-10-CM

## 2012-01-02 ENCOUNTER — Other Ambulatory Visit: Payer: Self-pay | Admitting: Family Medicine

## 2012-01-02 ENCOUNTER — Other Ambulatory Visit: Payer: Self-pay | Admitting: *Deleted

## 2012-01-02 ENCOUNTER — Encounter: Payer: Self-pay | Admitting: Gastroenterology

## 2012-01-02 DIAGNOSIS — Z9889 Other specified postprocedural states: Secondary | ICD-10-CM

## 2012-01-02 DIAGNOSIS — Z1231 Encounter for screening mammogram for malignant neoplasm of breast: Secondary | ICD-10-CM

## 2012-01-02 MED ORDER — VERAPAMIL HCL ER 240 MG PO TBCR: 240.0000 mg | EXTENDED_RELEASE_TABLET | Freq: Every day | ORAL | Status: DC

## 2012-01-02 NOTE — Telephone Encounter (Signed)
Sent!

## 2012-01-14 ENCOUNTER — Ambulatory Visit (INDEPENDENT_AMBULATORY_CARE_PROVIDER_SITE_OTHER): Payer: BC Managed Care – PPO | Admitting: Cardiology

## 2012-01-14 ENCOUNTER — Encounter: Payer: Self-pay | Admitting: Cardiology

## 2012-01-14 VITALS — BP 108/77 | HR 74 | Ht 65.0 in | Wt 187.0 lb

## 2012-01-14 DIAGNOSIS — R002 Palpitations: Secondary | ICD-10-CM

## 2012-01-14 DIAGNOSIS — Q233 Congenital mitral insufficiency: Secondary | ICD-10-CM

## 2012-01-14 DIAGNOSIS — I10 Essential (primary) hypertension: Secondary | ICD-10-CM

## 2012-01-14 DIAGNOSIS — I080 Rheumatic disorders of both mitral and aortic valves: Secondary | ICD-10-CM

## 2012-01-14 DIAGNOSIS — I517 Cardiomegaly: Secondary | ICD-10-CM

## 2012-01-14 NOTE — Assessment & Plan Note (Signed)
This will be evaluated as above. 

## 2012-01-14 NOTE — Assessment & Plan Note (Signed)
The blood pressure is at target. No change in medications is indicated. We will continue with therapeutic lifestyle changes (TLC).  

## 2012-01-14 NOTE — Assessment & Plan Note (Signed)
These are not particularly problematic. Continue with the verapamil.

## 2012-01-14 NOTE — Assessment & Plan Note (Signed)
Given the significant change from 2000 and 2012 on her echocardiogram I would like to repeat an echocardiogram this year to make sure this is stable without further decline in EF. If so no further change in therapy would be indicated. She will continue the verapamil however.

## 2012-01-14 NOTE — Progress Notes (Signed)
HPI The patient presents for followup of mild ventricular hypertrophy. She's also had palpitations. An echo in 2010 suggested moderate mitral regurgitation directed posteriorly. There was LVH. This was severe. She is also had some aortic insufficiency. However followup echocardiography last year suggested only mild mitral regurgitation and no significant ventricular hypertrophy or aortic insufficiency.  She presents for followup and says that she's done very well this year. She has been newly diagnosed with hepatitis C but this is not causing problems. She's exercising routinely pedaling a bicycle. She's eating better. She says she is rarely having any palpitations and these are not particularly problematic. She's not having any chest pressure, neck or arm discomfort. She's not having any shortness of breath, PND or orthopnea. He has had no weight gain or edema.  Allergies  Allergen Reactions  . Morphine Sulfate Swelling    Of face and tongue.  Marland Kitchen Penicillins Swelling    Of face and tongue.    Current Outpatient Prescriptions  Medication Sig Dispense Refill  . Cholecalciferol (VITAMIN D PO) Take by mouth daily.      . cyanocobalamin 1000 MCG tablet Take 100 mcg by mouth daily.      . verapamil (CALAN-SR) 240 MG CR tablet Take 1 tablet (240 mg total) by mouth at bedtime.  90 tablet  3    Past Medical History  Diagnosis Date  . Hypertrophic cardiomyopathy   . Substance abuse     history of, sober for years as of 2012  . Gallstones 06/18/2011  . Cough     due to flu  . Asthma     remote  . Hypertension       Dr Antoine Poche cardiac    Dr Para March pcp  in [place]  . HCV (hepatitis C virus)     1a    Past Surgical History  Procedure Date  . Oophorectomy 1980    right  . Doppler echocardiography 04/20/01    Mild LVH TR, MI, TI  . Nm myoview ltd 02/09/03    ETI, EF 72%  . Colonoscopy 5/05    SIGMF H/O colon ca father/aunt, sister with polyps 08/10/98  . Myomectomy 10/09/99    x2  pelvic US  . Holter     22 hrs- slow rhythym 54-116   - 3 ectopics rare svrr vein 02/06/00  . Nsvd     x 2  . Esophagogastroduodenoscopy 06/09/06    microscopically abnormal, Esoph and Gastric bx's  . Ganglionectomy     R foot, 12 YOA  . Thumb repair 1996    R LAC  . Breast reduction surgery 2001  . Colonoscopy divertuics 03/16/2007    SM INT HEMMS Dr. Marva Panda  . 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 Juanda Chance     89yrs  . Partial hysterectomy 2002    with no remaining ovary  . Ovarian cyst drainage 1980  . Colonoscopy 08/10/98    Nml  . Breast surgery 2001    reduction  . Lump removed 1964    right foot  . Cholecystectomy 07/17/2011    Procedure: LAPAROSCOPIC CHOLECYSTECTOMY WITH INTRAOPERATIVE CHOLANGIOGRAM;  Surgeon: Currie Paris, MD;  Location: [facility] OR;  Service: General;  Laterality: N/A;    ROS:  As stated in the HPI and negative for all other systems.  PHYSICAL EXAM BP 108/77  Pulse 74  Ht 5\' 5"  (1.651 m)  Wt 187 lb (84.823 kg)  BMI 31.12 kg/m2 GENERAL:  Well appearing NECK:  No jugular venous distention, waveform within normal limits, carotid upstroke brisk and symmetric, no bruits, no thyromegaly LYMPHATICS:  No cervical, inguinal adenopathy LUNGS:  Clear to auscultation bilaterally BACK:  No CVA tenderness CHEST:  Unremarkable HEART:  PMI not displaced or sustained,S1 and S2 within normal limits, no S3, no S4, no clicks, no rubs, no murmurs ABD:  Flat, positive bowel sounds normal in frequency in pitch, no bruits, no rebound, no guarding, no midline pulsatile mass, no hepatomegaly, no splenomegaly EXT:  2 plus pulses throughout, no edema, no cyanosis no clubbing  EKG:  Sinus rhythm, rate 74 his outpatient we then, axis within normal limits, intervals within normal limits, no acute ST-T wave changes. 01/14/2012   ASSESSMENT AND PLAN

## 2012-01-14 NOTE — Patient Instructions (Addendum)
The current medical regimen is effective;  continue present plan and medications.  Your physician has requested that you have an echocardiogram. Echocardiography is a painless test that uses sound waves to create images of your heart. It provides your doctor with information about the size and shape of your heart and how well your heart's chambers and valves are working. This procedure takes approximately one hour. There are no restrictions for this procedure.  Follow up with Dr Antoine Poche in approximately 18 months.

## 2012-01-22 ENCOUNTER — Ambulatory Visit (HOSPITAL_COMMUNITY): Payer: BC Managed Care – PPO | Attending: Cardiology

## 2012-01-22 DIAGNOSIS — R002 Palpitations: Secondary | ICD-10-CM | POA: Insufficient documentation

## 2012-01-22 DIAGNOSIS — Q233 Congenital mitral insufficiency: Secondary | ICD-10-CM

## 2012-01-22 DIAGNOSIS — I10 Essential (primary) hypertension: Secondary | ICD-10-CM | POA: Insufficient documentation

## 2012-01-22 DIAGNOSIS — I517 Cardiomegaly: Secondary | ICD-10-CM | POA: Insufficient documentation

## 2012-01-22 DIAGNOSIS — I059 Rheumatic mitral valve disease, unspecified: Secondary | ICD-10-CM | POA: Insufficient documentation

## 2012-01-22 DIAGNOSIS — B1920 Unspecified viral hepatitis C without hepatic coma: Secondary | ICD-10-CM | POA: Insufficient documentation

## 2012-01-22 NOTE — Progress Notes (Signed)
Echocardiogram performed.  

## 2012-01-27 ENCOUNTER — Ambulatory Visit
Admission: RE | Admit: 2012-01-27 | Discharge: 2012-01-27 | Disposition: A | Discharge status: Home / Self Care | Payer: BC Managed Care – PPO | Source: Ambulatory Visit | Attending: Family Medicine | Admitting: Family Medicine

## 2012-01-27 DIAGNOSIS — Z1231 Encounter for screening mammogram for malignant neoplasm of breast: Secondary | ICD-10-CM

## 2012-01-27 DIAGNOSIS — Z9889 Other specified postprocedural states: Secondary | ICD-10-CM

## 2012-01-30 ENCOUNTER — Encounter: Payer: Self-pay | Admitting: *Deleted

## 2012-02-03 ENCOUNTER — Ambulatory Visit (INDEPENDENT_AMBULATORY_CARE_PROVIDER_SITE_OTHER): Payer: BC Managed Care – PPO | Admitting: Family Medicine

## 2012-02-03 ENCOUNTER — Encounter: Payer: Self-pay | Admitting: Family Medicine

## 2012-02-03 ENCOUNTER — Ambulatory Visit (INDEPENDENT_AMBULATORY_CARE_PROVIDER_SITE_OTHER)
Admission: RE | Admit: 2012-02-03 | Discharge: 2012-02-03 | Disposition: A | Discharge status: Home / Self Care | Payer: BC Managed Care – PPO | Source: Ambulatory Visit | Attending: Family Medicine | Admitting: Family Medicine

## 2012-02-03 VITALS — BP 122/82 | HR 70 | Temp 98.1°F | Wt 184.0 lb

## 2012-02-03 DIAGNOSIS — M79673 Pain in unspecified foot: Secondary | ICD-10-CM

## 2012-02-03 DIAGNOSIS — M79609 Pain in unspecified limb: Secondary | ICD-10-CM

## 2012-02-03 NOTE — Patient Instructions (Addendum)
Ice your foot and use heat on your back. That should help.

## 2012-02-03 NOTE — Progress Notes (Signed)
Fell 6 days ago.  Fell backward and hit a metal rod with her L foot.  No LOC.  Was able to stand and walk to the car.  Back and buttock pain at that point.  L foot pain worse in meantime. Pain at distal MTs on L foot.  Cramping pain, worse sitting.  R lower back pain, not in the midline.  She went to the chiropractor the day of the fall.    Meds, vitals, and allergies reviewed.   ROS: See HPI.  Otherwise, noncontributory.  nad ncat Back w/o midline pain R lower paraspinal muscles in L spine tight w/o bruising L foot with stable mortise and midfoot, NV intact.  Not ttp except for the lateral 1st phalanx of great toe. No edema or erythema, no bruising.

## 2012-02-04 DIAGNOSIS — M79673 Pain in unspecified foot: Secondary | ICD-10-CM | POA: Insufficient documentation

## 2012-02-04 DIAGNOSIS — M79606 Pain in leg, unspecified: Secondary | ICD-10-CM | POA: Insufficient documentation

## 2012-02-04 NOTE — Assessment & Plan Note (Signed)
No fx on review of films. Feels better in flip flop. Can weight bear. Would allow more time to heal, can ice the area.  Likely soft tissue injury. No motor deficit.  Back pain should also gradually heal, can use local heat there. Benign exam.

## 2012-03-02 ENCOUNTER — Encounter: Payer: Self-pay | Admitting: Family Medicine

## 2012-03-02 ENCOUNTER — Ambulatory Visit (INDEPENDENT_AMBULATORY_CARE_PROVIDER_SITE_OTHER): Payer: BC Managed Care – PPO | Admitting: Family Medicine

## 2012-03-02 VITALS — BP 110/70 | HR 72 | Temp 98.4°F | Wt 181.2 lb

## 2012-03-02 DIAGNOSIS — M76899 Other specified enthesopathies of unspecified lower limb, excluding foot: Secondary | ICD-10-CM

## 2012-03-02 DIAGNOSIS — M7060 Trochanteric bursitis, unspecified hip: Secondary | ICD-10-CM

## 2012-03-02 NOTE — Progress Notes (Signed)
R hip and leg pain.  Had driven to [facility] 02/14/12.  No L sided sx.  Pain is worse walking.  Some intermittent pain sitting.  If she props her leg up when supine, the pain resolves.  No pop, click.  No injury except for a fall in 7/13 and didn't have hip pain at that point.  Pain down the R lateral leg, dull pain.  Overall the pain is about the same over the last few weeks.  "my hip just feels funny."  No meds tried for the pain.    Meds, vitals, and allergies reviewed.   ROS: See HPI.  Otherwise, noncontributory.  nad ncat rrr ctab Back w/o midline pain R troch ttp ITB not weak on testing Normal int/ext rotation on R hip Distally grossly nv intact

## 2012-03-02 NOTE — Assessment & Plan Note (Signed)
Anatomy d/w pt.  Handout given re: exercises.  Ice locally and f/u prn.  She agrees.

## 2012-03-02 NOTE — Patient Instructions (Addendum)
Ice your hip and use the stretches.  This should improve.

## 2012-03-06 ENCOUNTER — Telehealth: Payer: Self-pay | Admitting: Family Medicine

## 2012-03-06 DIAGNOSIS — M25559 Pain in unspecified hip: Secondary | ICD-10-CM

## 2012-03-06 NOTE — Telephone Encounter (Signed)
Okay to refer.  Ortho.

## 2012-03-06 NOTE — Telephone Encounter (Signed)
Patient advised.

## 2012-03-06 NOTE — Telephone Encounter (Signed)
Pt called stating she still has severe hip pain, would she be eligible for a referral?

## 2012-04-21 ENCOUNTER — Other Ambulatory Visit: Payer: Self-pay | Admitting: Orthopedic Surgery

## 2012-04-21 DIAGNOSIS — M25551 Pain in right hip: Secondary | ICD-10-CM

## 2012-04-25 ENCOUNTER — Other Ambulatory Visit: Payer: BC Managed Care – PPO

## 2012-04-28 ENCOUNTER — Ambulatory Visit
Admission: RE | Admit: 2012-04-28 | Discharge: 2012-04-28 | Disposition: A | Discharge status: Home / Self Care | Payer: BC Managed Care – PPO | Source: Ambulatory Visit | Attending: Orthopedic Surgery | Admitting: Orthopedic Surgery

## 2012-04-28 DIAGNOSIS — M25551 Pain in right hip: Secondary | ICD-10-CM

## 2012-05-12 ENCOUNTER — Encounter: Payer: Self-pay | Admitting: Family Medicine

## 2012-05-12 ENCOUNTER — Ambulatory Visit (INDEPENDENT_AMBULATORY_CARE_PROVIDER_SITE_OTHER): Payer: BC Managed Care – PPO | Admitting: Family Medicine

## 2012-05-12 VITALS — BP 118/74 | HR 69 | Temp 98.3°F | Resp 20 | Ht 65.0 in | Wt 180.5 lb

## 2012-05-12 DIAGNOSIS — J329 Chronic sinusitis, unspecified: Secondary | ICD-10-CM

## 2012-05-12 MED ORDER — AZITHROMYCIN 250 MG PO TABS: ORAL_TABLET | ORAL | Status: DC

## 2012-05-12 NOTE — Progress Notes (Signed)
"  sick for one month."  Grandkid was sick.  Fatigued and had a cough.  She was improved, went to the [place] 10/17-10/20, then sx returned.  No help with OTC meds. Worse over last 10 days, ST, post nasal gtt, ears feel stuffy.  No ear pain. Persistently stuffy.  Nasal discharge.  Cough comes in fits, no sputum. Quit smoking. No fevers.    Meds, vitals, and allergies reviewed.   ROS: See HPI.  Otherwise, noncontributory.  GEN: nad, alert and oriented HEENT: mucous membranes moist, tm w/o erythema, nasal exam w/o erythema but L nostril with remnant of some bloody rhinorrhea, clear discharge noted o/w,  OP with cobblestoning NECK: supple w/o LA CV: rrr.   PULM: ctab, no inc wob EXT: no edema SKIN: no acute rash

## 2012-05-12 NOTE — Patient Instructions (Addendum)
Drink plenty of fluids and gargle with warm salt water for your throat.  Keep taking claritin and start the antibiotics.  This should gradually improve.  Take care.  Let us know if you have other concerns.   Get a flu shot when you feel better.

## 2012-05-13 DIAGNOSIS — J329 Chronic sinusitis, unspecified: Secondary | ICD-10-CM | POA: Insufficient documentation

## 2012-05-13 NOTE — Assessment & Plan Note (Signed)
Would treat given the duration.  D/w pt.  Avoid flonase due to prev nosebleed.  Nontoxic, f/u prn.

## 2012-06-10 ENCOUNTER — Ambulatory Visit (INDEPENDENT_AMBULATORY_CARE_PROVIDER_SITE_OTHER): Payer: BC Managed Care – PPO

## 2012-06-10 DIAGNOSIS — Z23 Encounter for immunization: Secondary | ICD-10-CM

## 2012-06-11 ENCOUNTER — Ambulatory Visit: Payer: BC Managed Care – PPO

## 2012-06-29 ENCOUNTER — Emergency Department (HOSPITAL_COMMUNITY)
Admission: EM | Admit: 2012-06-29 | Discharge: 2012-06-29 | Disposition: A | Discharge status: Home / Self Care | Payer: BC Managed Care – PPO | Attending: Emergency Medicine | Admitting: Emergency Medicine

## 2012-06-29 ENCOUNTER — Encounter (HOSPITAL_COMMUNITY): Payer: Self-pay | Admitting: Emergency Medicine

## 2012-06-29 ENCOUNTER — Emergency Department (HOSPITAL_COMMUNITY): Payer: BC Managed Care – PPO

## 2012-06-29 DIAGNOSIS — F1910 Other psychoactive substance abuse, uncomplicated: Secondary | ICD-10-CM | POA: Insufficient documentation

## 2012-06-29 DIAGNOSIS — Z8719 Personal history of other diseases of the digestive system: Secondary | ICD-10-CM | POA: Insufficient documentation

## 2012-06-29 DIAGNOSIS — Z8619 Personal history of other infectious and parasitic diseases: Secondary | ICD-10-CM | POA: Insufficient documentation

## 2012-06-29 DIAGNOSIS — Y939 Activity, unspecified: Secondary | ICD-10-CM | POA: Insufficient documentation

## 2012-06-29 DIAGNOSIS — J45909 Unspecified asthma, uncomplicated: Secondary | ICD-10-CM | POA: Insufficient documentation

## 2012-06-29 DIAGNOSIS — Z79899 Other long term (current) drug therapy: Secondary | ICD-10-CM | POA: Insufficient documentation

## 2012-06-29 DIAGNOSIS — Y929 Unspecified place or not applicable: Secondary | ICD-10-CM | POA: Insufficient documentation

## 2012-06-29 DIAGNOSIS — I10 Essential (primary) hypertension: Secondary | ICD-10-CM | POA: Insufficient documentation

## 2012-06-29 DIAGNOSIS — Z8679 Personal history of other diseases of the circulatory system: Secondary | ICD-10-CM | POA: Insufficient documentation

## 2012-06-29 DIAGNOSIS — IMO0002 Reserved for concepts with insufficient information to code with codable children: Secondary | ICD-10-CM | POA: Insufficient documentation

## 2012-06-29 DIAGNOSIS — S8000XA Contusion of unspecified knee, initial encounter: Secondary | ICD-10-CM | POA: Insufficient documentation

## 2012-06-29 DIAGNOSIS — Z87891 Personal history of nicotine dependence: Secondary | ICD-10-CM | POA: Insufficient documentation

## 2012-06-29 MED ORDER — IBUPROFEN 400 MG PO TABS
600.0000 mg | ORAL_TABLET | Freq: Once | ORAL | Status: AC
  Administered 2012-06-29: 600 mg via ORAL
  Filled 2012-06-29: qty 1

## 2012-06-29 NOTE — ED Notes (Signed)
Pt c/o left knee pain after hitting on planter; pt sts still having pain; no obvious deformity noted

## 2012-06-29 NOTE — ED Provider Notes (Signed)
History     CSN: [id number]  Arrival date & time 06/29/12  1826   First MD Initiated Contact with Patient 06/29/12 1958      Chief Complaint  Patient presents with  . Knee Pain    (Consider location/radiation/quality/duration/timing/severity/associated sxs/prior treatment) HPI Comments: Patient hit a cement planter with the lateral aspect of her left knee now having pain the radiates to lower leg She has not taken any medication PTA nor applied ice   Patient is a 61 y.o. female presenting with knee pain. The history is provided by the patient.  Knee Pain This is a new problem. The current episode started today. The problem occurs constantly. The problem has been unchanged. Pertinent negatives include no joint swelling or numbness.    Past Medical History  Diagnosis Date  . Hypertrophic cardiomyopathy   . Substance abuse     history of, sober for years as of 2013  . Gallstones 06/18/2011  . Cough     due to flu  . Asthma     remote  . Hypertension       Dr Antoine Poche cardiac    Dr Para March pcp  in [place]  . HCV (hepatitis C virus)     1a    Past Surgical History  Procedure Date  . Oophorectomy 1980    right  . Doppler echocardiography 04/20/01    Mild LVH TR, MI, TI  . Nm myoview ltd 02/09/03    ETI, EF 72%  . Colonoscopy 5/05    SIGMF H/O colon ca father/aunt, sister with polyps 08/10/98  . Myomectomy 10/09/99    x2 pelvic US  . Holter     22 hrs- slow rhythym 54-116   - 3 ectopics rare svrr vein 02/06/00  . Nsvd     x 2  . Esophagogastroduodenoscopy 06/09/06    microscopically abnormal, Esoph and Gastric bx's  . Ganglionectomy     R foot, 12 YOA  . Thumb repair 1996    R LAC  . Breast reduction surgery 2001  . Colonoscopy divertuics 03/16/2007    SM INT HEMMS Dr. Marva Panda  . 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 Juanda Chance     86yrs  . Partial hysterectomy 2002    with no remaining ovary  . Ovarian cyst drainage 1980  . Colonoscopy 08/10/98    Nml    . Breast surgery 2001    reduction  . Lump removed 1964    right foot  . Cholecystectomy 07/17/2011    Procedure: LAPAROSCOPIC CHOLECYSTECTOMY WITH INTRAOPERATIVE CHOLANGIOGRAM;  Surgeon: Currie Paris, MD;  Location: [facility] OR;  Service: General;  Laterality: N/A;    Family History  Problem Relation Age of Onset  . Colon cancer Father   . Cancer Father     colon  . Diabetes Brother   . Colon cancer      Aunt  . Diabetes Sister   . Hypertension Brother   . Cancer Paternal Aunt     colon  . Cancer Paternal Uncle     lung  . Cancer Cousin     breast    History  Substance Use Topics  . Smoking status: Former Smoker -- 2.0 packs/day for 40 years    Types: Cigarettes    Quit date: 07/07/2005  . Smokeless tobacco: Never Used  . Alcohol Use: Yes     Comment: occassional    OB History    Grav Para Term Preterm Abortions TAB  SAB Ect Mult Living                  Review of Systems  Constitutional: Negative for activity change.  HENT: Negative.   Eyes: Negative.   Respiratory: Negative.   Cardiovascular: Negative for leg swelling.  Gastrointestinal: Negative.   Musculoskeletal: Negative for joint swelling.  Neurological: Negative for numbness.    Allergies  Morphine sulfate and Penicillins  Home Medications   Current Outpatient Rx  Name  Route  Sig  Dispense  Refill  . VITAMIN D PO   Oral   Take 1 tablet by mouth daily.          . CYANOCOBALAMIN 1000 MCG PO TABS   Oral   Take 100 mcg by mouth daily.         Marland Kitchen VERAPAMIL HCL ER 240 MG PO TBCR   Oral   Take 240 mg by mouth daily.         . VOLTAREN 1 % TD GEL   Topical   Apply 2 g topically daily as needed. For pain.           BP 130/84  Pulse 73  Temp 98.2 F (36.8 C) (Oral)  Resp 16  SpO2 98%  Physical Exam  Nursing note and vitals reviewed. Constitutional: She is oriented to person, place, and time. She appears well-developed and well-nourished.  HENT:  Head: Normocephalic.  Eyes:  Pupils are equal, round, and reactive to light.  Neck: Normal range of motion.  Cardiovascular: Normal rate.   Pulmonary/Chest: Effort normal.  Musculoskeletal: Normal range of motion. She exhibits edema and tenderness.       Legs: Neurological: She is alert and oriented to person, place, and time.  Skin: Skin is warm.    ED Course  Procedures (including critical care time)  Labs Reviewed - No data to display Dg Knee Complete 4 Views Left  06/29/2012  *RADIOLOGY REPORT*  Clinical Data: Bumped knee against a flower pot.  Pain.  LEFT KNEE - COMPLETE 4+ VIEW  Comparison: None.  Findings: There is no evidence of fracture or dislocation.  There is no evidence of arthropathy or other focal bony abnormality. Soft tissues are unremarkable. No visible effusion.  Mild spurring at the attachment of the quadriceps tendon.  IMPRESSION: No acute findings.   Original Report Authenticated By: Davonna Belling, M.D.      1. Knee contusion       MDM          Arman Filter, NP 06/30/12 2312

## 2012-06-29 NOTE — ED Provider Notes (Signed)
Medical screening examination/treatment/procedure(s) were conducted as a shared visit with non-physician practitioner(s) and myself.  I personally evaluated the patient during the encounter.  Stable left knee exam, tender left proximal tibia only.  Hurman Horn, MD 07/02/12 1255

## 2012-06-29 NOTE — ED Notes (Addendum)
Pt present with left knee pain after hitting it on a concrete planter around noon today, has had difficulty bearing weight on knee since injury.  Pt states pain has began to radiate down her leg and into her foot, pedal pulses strong and present.  No obvious deformity noted.

## 2012-07-03 NOTE — ED Provider Notes (Signed)
Medical screening examination/treatment/procedure(s) were performed by non-physician practitioner and as supervising physician I was immediately available for consultation/collaboration.   Chalon Zobrist M Braedyn Kauk, MD 07/03/12 1533 

## 2012-07-09 ENCOUNTER — Ambulatory Visit (INDEPENDENT_AMBULATORY_CARE_PROVIDER_SITE_OTHER): Payer: BC Managed Care – PPO | Admitting: Family Medicine

## 2012-07-09 ENCOUNTER — Encounter: Payer: Self-pay | Admitting: Family Medicine

## 2012-07-09 VITALS — BP 138/72 | HR 82 | Temp 98.0°F | Wt 183.8 lb

## 2012-07-09 DIAGNOSIS — L989 Disorder of the skin and subcutaneous tissue, unspecified: Secondary | ICD-10-CM

## 2012-07-09 NOTE — Patient Instructions (Addendum)
The skin spots look benign and I wouldn't do anything about them.

## 2012-07-09 NOTE — Progress Notes (Signed)
Several skin lesions she wanted evaluated.  Long standing lesions.  No acute changes.  Feeling well o/w.   Meds, vitals, and allergies reviewed.   ROS: See HPI.  Otherwise, noncontributory.  ncat Mild hyperpigmentation in the lateral creases at the junction of the eyelids B 1 very small skin tag noted inferior/lateral to R eye Very small (~40mm) hyperpigmented papules B inferior/lateral to B eyes, similar lesions on the neck noted 4mm SK on L lower back

## 2012-07-10 DIAGNOSIS — R21 Rash and other nonspecific skin eruption: Secondary | ICD-10-CM | POA: Insufficient documentation

## 2012-07-10 NOTE — Assessment & Plan Note (Signed)
All appear benign, reassured about all of them.  Follow clinically.

## 2012-07-17 ENCOUNTER — Encounter: Payer: BC Managed Care – PPO | Admitting: Family Medicine

## 2012-08-24 ENCOUNTER — Encounter: Payer: Self-pay | Admitting: Family Medicine

## 2012-08-24 ENCOUNTER — Ambulatory Visit (INDEPENDENT_AMBULATORY_CARE_PROVIDER_SITE_OTHER): Payer: BC Managed Care – PPO | Admitting: Family Medicine

## 2012-08-24 ENCOUNTER — Ambulatory Visit (INDEPENDENT_AMBULATORY_CARE_PROVIDER_SITE_OTHER)
Admission: RE | Admit: 2012-08-24 | Discharge: 2012-08-24 | Disposition: A | Discharge status: Home / Self Care | Payer: BC Managed Care – PPO | Source: Ambulatory Visit | Attending: Family Medicine | Admitting: Family Medicine

## 2012-08-24 VITALS — BP 126/84 | HR 61 | Temp 98.2°F | Wt 181.0 lb

## 2012-08-24 DIAGNOSIS — R109 Unspecified abdominal pain: Secondary | ICD-10-CM

## 2012-08-24 DIAGNOSIS — M6530 Trigger finger, unspecified finger: Secondary | ICD-10-CM

## 2012-08-24 DIAGNOSIS — R079 Chest pain, unspecified: Secondary | ICD-10-CM

## 2012-08-24 DIAGNOSIS — R05 Cough: Secondary | ICD-10-CM

## 2012-08-24 NOTE — Patient Instructions (Addendum)
Go to the lab on the way out.  We'll contact you with your xray report. See Shirlee Limerick about your referral before you leave today. If your xray is normal, then see if taking prilosec 20mg  a day help helps.  Call Dr. Juanda Chance about the abdominal symptoms in the meantime.   If the cough doesn't improve, then notify me.

## 2012-08-24 NOTE — Progress Notes (Signed)
She thought she had pulled a muscle and had anterior chest pain 08/01/12.  "It felt like a pulled muscle."  No pain with movement in the interval or with a deep breath.  It was improved after about 1 week- it would happen less frequently, but would return episodically.    She has had a cough for the last few months.  Last night she had a cough and the above pain returned.  Cough is dry.  No fevers.  No heartburn.  She has to clear her throat every AM but o/w no sig post nasal gtt.  She quit smoking 2006.   She hasn't taken any meds for the pain or the cough.  She didn't know if she could attribute some of this to the heat source in her home, but she didn't have similar last winter.  CP free now.  She can walk w/o CP. She has no clear trigger for the chest symptoms o/w.    She continues to have intermittent RUQ discomfort in spite of her cholecystectomy.  No vomiting. She notes that eating peanut butter causes it. It doesn't happen if she is fasting.  She had seen Dr. Juanda Chance for colonoscopy prev.    L 3rd finger has been triggering and is painful.  Has been sticking at the PIP.  She is left handed.   Meds, vitals, and allergies reviewed.   ROS: See HPI.  Otherwise, noncontributory.  GEN: nad, alert and oriented HEENT: mucous membranes moist NECK: supple w/o LA CV: rrr, chest wall not ttp PULM: ctab, no inc wob ABD: soft, +bs EXT: no edema SKIN: no acute rash L hand with 3rd finger triggering at the PIP  EKG w/o acute changes.

## 2012-08-25 DIAGNOSIS — R05 Cough: Secondary | ICD-10-CM | POA: Insufficient documentation

## 2012-08-25 DIAGNOSIS — R059 Cough, unspecified: Secondary | ICD-10-CM | POA: Insufficient documentation

## 2012-08-25 DIAGNOSIS — M6530 Trigger finger, unspecified finger: Secondary | ICD-10-CM | POA: Insufficient documentation

## 2012-08-25 NOTE — Assessment & Plan Note (Signed)
Discussed.  Unclear source.  She'll call GI about follow up on this.

## 2012-08-25 NOTE — Assessment & Plan Note (Signed)
Refer to ortho.

## 2012-08-25 NOTE — Assessment & Plan Note (Signed)
Unclear source.  cxr unremarkable and ctab.  Possible GERD component.  Not on ACE, no wheeze.  Would treat for GERD preemptively with OTC PPI and if not improved, she'll notify the clinic.

## 2012-08-28 ENCOUNTER — Encounter: Payer: Self-pay | Admitting: *Deleted

## 2012-09-05 DIAGNOSIS — M6530 Trigger finger, unspecified finger: Secondary | ICD-10-CM

## 2012-09-05 HISTORY — DX: Trigger finger, unspecified finger: M65.30

## 2012-09-22 ENCOUNTER — Encounter (HOSPITAL_BASED_OUTPATIENT_CLINIC_OR_DEPARTMENT_OTHER): Payer: Self-pay | Admitting: *Deleted

## 2012-09-22 ENCOUNTER — Other Ambulatory Visit: Payer: Self-pay | Admitting: Orthopedic Surgery

## 2012-09-22 NOTE — Pre-Procedure Instructions (Signed)
To come for CMET 

## 2012-09-23 ENCOUNTER — Encounter (HOSPITAL_BASED_OUTPATIENT_CLINIC_OR_DEPARTMENT_OTHER)
Admission: RE | Admit: 2012-09-23 | Discharge: 2012-09-23 | Disposition: A | Discharge status: Home / Self Care | Payer: BC Managed Care – PPO | Source: Ambulatory Visit | Attending: Orthopedic Surgery | Admitting: Orthopedic Surgery

## 2012-09-23 LAB — COMPREHENSIVE METABOLIC PANEL
CO2: 32 mEq/L (ref 19–32)
Calcium: 10.2 mg/dL (ref 8.4–10.5)
Chloride: 96 mEq/L (ref 96–112)
Creatinine, Ser: 0.64 mg/dL (ref 0.50–1.10)
GFR calc Af Amer: >90 mL/min (ref >90)
GFR calc non Af Amer: >90 mL/min (ref >90)
Glucose, Bld: 82 mg/dL (ref 70–99)
Total Bilirubin: 0.6 mg/dL (ref 0.3–1.2)

## 2012-09-28 ENCOUNTER — Ambulatory Visit (HOSPITAL_BASED_OUTPATIENT_CLINIC_OR_DEPARTMENT_OTHER): Payer: BC Managed Care – PPO | Admitting: Certified Registered Nurse Anesthetist

## 2012-09-28 ENCOUNTER — Encounter (HOSPITAL_BASED_OUTPATIENT_CLINIC_OR_DEPARTMENT_OTHER): Payer: Self-pay | Admitting: Certified Registered Nurse Anesthetist

## 2012-09-28 ENCOUNTER — Ambulatory Visit (HOSPITAL_BASED_OUTPATIENT_CLINIC_OR_DEPARTMENT_OTHER)
Admission: RE | Admit: 2012-09-28 | Discharge: 2012-09-28 | Disposition: A | Discharge status: Home / Self Care | Payer: BC Managed Care – PPO | Source: Ambulatory Visit | Attending: Orthopedic Surgery | Admitting: Orthopedic Surgery

## 2012-09-28 ENCOUNTER — Encounter (HOSPITAL_BASED_OUTPATIENT_CLINIC_OR_DEPARTMENT_OTHER)
Admission: RE | Disposition: A | Discharge status: Home / Self Care | Payer: Self-pay | Source: Ambulatory Visit | Attending: Orthopedic Surgery

## 2012-09-28 DIAGNOSIS — B1920 Unspecified viral hepatitis C without hepatic coma: Secondary | ICD-10-CM | POA: Insufficient documentation

## 2012-09-28 DIAGNOSIS — K219 Gastro-esophageal reflux disease without esophagitis: Secondary | ICD-10-CM | POA: Insufficient documentation

## 2012-09-28 DIAGNOSIS — I10 Essential (primary) hypertension: Secondary | ICD-10-CM | POA: Insufficient documentation

## 2012-09-28 DIAGNOSIS — Z885 Allergy status to narcotic agent status: Secondary | ICD-10-CM | POA: Insufficient documentation

## 2012-09-28 DIAGNOSIS — Z87891 Personal history of nicotine dependence: Secondary | ICD-10-CM | POA: Insufficient documentation

## 2012-09-28 DIAGNOSIS — M6530 Trigger finger, unspecified finger: Secondary | ICD-10-CM | POA: Insufficient documentation

## 2012-09-28 DIAGNOSIS — Z6830 Body mass index (BMI) 30.0-30.9, adult: Secondary | ICD-10-CM | POA: Insufficient documentation

## 2012-09-28 DIAGNOSIS — I422 Other hypertrophic cardiomyopathy: Secondary | ICD-10-CM | POA: Insufficient documentation

## 2012-09-28 DIAGNOSIS — Z880 Allergy status to penicillin: Secondary | ICD-10-CM | POA: Insufficient documentation

## 2012-09-28 DIAGNOSIS — I739 Peripheral vascular disease, unspecified: Secondary | ICD-10-CM | POA: Insufficient documentation

## 2012-09-28 HISTORY — DX: Trigger finger, unspecified finger: M65.30

## 2012-09-28 HISTORY — DX: Gastro-esophageal reflux disease without esophagitis: K21.9

## 2012-09-28 HISTORY — PX: TRIGGER FINGER RELEASE: SHX641

## 2012-09-28 SURGERY — RELEASE, A1 PULLEY, FOR TRIGGER FINGER
Anesthesia: Monitor Anesthesia Care | Site: Finger | Laterality: Left | Wound class: Clean

## 2012-09-28 MED ORDER — OXYCODONE HCL 5 MG PO TABS: 5.0000 mg | ORAL_TABLET | Freq: Once | ORAL | Status: DC | PRN

## 2012-09-28 MED ORDER — FENTANYL CITRATE 0.05 MG/ML IJ SOLN: 25.0000 ug | INTRAMUSCULAR | Status: DC | PRN

## 2012-09-28 MED ORDER — MEPERIDINE HCL 25 MG/ML IJ SOLN: 6.2500 mg | INTRAMUSCULAR | Status: DC | PRN

## 2012-09-28 MED ORDER — ONDANSETRON HCL 4 MG/2ML IJ SOLN
INTRAMUSCULAR | Status: DC | PRN
  Administered 2012-09-28: 4 mg via INTRAVENOUS

## 2012-09-28 MED ORDER — FENTANYL CITRATE 0.05 MG/ML IJ SOLN
INTRAMUSCULAR | Status: DC | PRN
  Administered 2012-09-28: 25 ug via INTRAVENOUS

## 2012-09-28 MED ORDER — MIDAZOLAM HCL 2 MG/2ML IJ SOLN: 0.5000 mg | Freq: Once | INTRAMUSCULAR | Status: DC | PRN

## 2012-09-28 MED ORDER — LIDOCAINE HCL (PF) 1 % IJ SOLN
INTRAMUSCULAR | Status: DC | PRN
  Administered 2012-09-28: 2 mL

## 2012-09-28 MED ORDER — LACTATED RINGERS IV SOLN: INTRAVENOUS | Status: DC

## 2012-09-28 MED ORDER — HYDROCODONE-ACETAMINOPHEN 5-325 MG PO TABS: 1.0000 | ORAL_TABLET | ORAL | Status: DC | PRN

## 2012-09-28 MED ORDER — MIDAZOLAM HCL 2 MG/2ML IJ SOLN: 1.0000 mg | INTRAMUSCULAR | Status: DC | PRN

## 2012-09-28 MED ORDER — OXYCODONE HCL 5 MG/5ML PO SOLN: 5.0000 mg | Freq: Once | ORAL | Status: DC | PRN

## 2012-09-28 MED ORDER — OXYCODONE-ACETAMINOPHEN 5-325 MG PO TABS: 1.0000 | ORAL_TABLET | ORAL | Status: DC | PRN

## 2012-09-28 MED ORDER — FENTANYL CITRATE 0.05 MG/ML IJ SOLN: 50.0000 ug | INTRAMUSCULAR | Status: DC | PRN

## 2012-09-28 MED ORDER — LIDOCAINE HCL (CARDIAC) 20 MG/ML IV SOLN
INTRAVENOUS | Status: DC | PRN
  Administered 2012-09-28: 60 mg via INTRAVENOUS

## 2012-09-28 MED ORDER — PROPOFOL INFUSION 10 MG/ML OPTIME
INTRAVENOUS | Status: DC | PRN
  Administered 2012-09-28: 75 ug/kg/min via INTRAVENOUS

## 2012-09-28 MED ORDER — LACTATED RINGERS IV SOLN
INTRAVENOUS | Status: DC
  Administered 2012-09-28: 07:00:00 via INTRAVENOUS

## 2012-09-28 MED ORDER — MIDAZOLAM HCL 5 MG/5ML IJ SOLN
INTRAMUSCULAR | Status: DC | PRN
  Administered 2012-09-28: .5 mg via INTRAVENOUS

## 2012-09-28 MED ORDER — PROMETHAZINE HCL 25 MG/ML IJ SOLN: 6.2500 mg | INTRAMUSCULAR | Status: DC | PRN

## 2012-09-28 MED ORDER — MIDAZOLAM HCL 2 MG/ML PO SYRP: 12.0000 mg | ORAL_SOLUTION | Freq: Once | ORAL | Status: DC | PRN

## 2012-09-28 MED ORDER — BUPIVACAINE-EPINEPHRINE 0.5% -1:200000 IJ SOLN
INTRAMUSCULAR | Status: DC | PRN
  Administered 2012-09-28: 2 mL

## 2012-09-28 MED ORDER — HYDROMORPHONE HCL PF 1 MG/ML IJ SOLN: 0.5000 mg | INTRAMUSCULAR | Status: DC | PRN

## 2012-09-28 MED ORDER — VANCOMYCIN HCL IN DEXTROSE 1-5 GM/200ML-% IV SOLN
1000.0000 mg | INTRAVENOUS | Status: AC
  Administered 2012-09-28: 1000 mg via INTRAVENOUS

## 2012-09-28 SURGICAL SUPPLY — 37 items
BANDAGE COBAN STERILE 2 (GAUZE/BANDAGES/DRESSINGS) ×1 IMPLANT
BANDAGE CONFORM 2  STR LF (GAUZE/BANDAGES/DRESSINGS) ×2 IMPLANT
BANDAGE GAUZE ELAST BULKY 4 IN (GAUZE/BANDAGES/DRESSINGS) IMPLANT
BLADE MINI RND TIP GREEN BEAV (BLADE) IMPLANT
BLADE SURG 15 STRL LF DISP TIS (BLADE) ×1 IMPLANT
BLADE SURG 15 STRL SS (BLADE) ×2
BNDG CMPR 9X4 STRL LF SNTH (GAUZE/BANDAGES/DRESSINGS) ×1
BNDG COHESIVE 1X5 TAN STRL LF (GAUZE/BANDAGES/DRESSINGS) ×1 IMPLANT
BNDG COHESIVE 4X5 TAN STRL (GAUZE/BANDAGES/DRESSINGS) IMPLANT
BNDG ESMARK 4X9 LF (GAUZE/BANDAGES/DRESSINGS) ×1 IMPLANT
CHLORAPREP W/TINT 26ML (MISCELLANEOUS) ×2 IMPLANT
COVER MAYO STAND STRL (DRAPES) ×2 IMPLANT
COVER TABLE BACK 60X90 (DRAPES) ×2 IMPLANT
CUFF TOURNIQUET SINGLE 18IN (TOURNIQUET CUFF) ×1 IMPLANT
DRAPE EXTREMITY T 121X128X90 (DRAPE) ×2 IMPLANT
DRAPE SURG 17X23 STRL (DRAPES) ×2 IMPLANT
DRSG EMULSION OIL 3X3 NADH (GAUZE/BANDAGES/DRESSINGS) ×2 IMPLANT
GAUZE SPONGE 4X4 12PLY STRL LF (GAUZE/BANDAGES/DRESSINGS) ×2 IMPLANT
GLOVE BIO SURGEON STRL SZ7.5 (GLOVE) ×2 IMPLANT
GLOVE BIOGEL PI IND STRL 8 (GLOVE) ×1 IMPLANT
GLOVE BIOGEL PI INDICATOR 8 (GLOVE) ×1
GLOVE ECLIPSE 6.5 STRL STRAW (GLOVE) ×2 IMPLANT
GOWN PREVENTION PLUS XLARGE (GOWN DISPOSABLE) ×2 IMPLANT
NDL SAFETY ECLIPSE 18X1.5 (NEEDLE) IMPLANT
NEEDLE HYPO 18GX1.5 SHARP (NEEDLE)
NEEDLE HYPO 25X1 1.5 SAFETY (NEEDLE) ×2 IMPLANT
NS IRRIG 1000ML POUR BTL (IV SOLUTION) ×2 IMPLANT
PACK BASIN DAY SURGERY FS (CUSTOM PROCEDURE TRAY) ×2 IMPLANT
PADDING CAST ABS 4INX4YD NS (CAST SUPPLIES)
PADDING CAST ABS COTTON 4X4 ST (CAST SUPPLIES) IMPLANT
STOCKINETTE 4X48 STRL (DRAPES) ×2 IMPLANT
SUT VICRYL RAPIDE 4/0 PS 2 (SUTURE) ×1 IMPLANT
SYR BULB 3OZ (MISCELLANEOUS) ×1 IMPLANT
SYRINGE 10CC LL (SYRINGE) ×1 IMPLANT
TOWEL OR 17X24 6PK STRL BLUE (TOWEL DISPOSABLE) ×2 IMPLANT
TOWEL OR NON WOVEN STRL DISP B (DISPOSABLE) ×2 IMPLANT
UNDERPAD 30X30 INCONTINENT (UNDERPADS AND DIAPERS) ×2 IMPLANT

## 2012-09-28 NOTE — Op Note (Signed)
09/28/2012  7:58 AM  PATIENT:  [name]  62 y.o. female  PRE-OPERATIVE DIAGNOSIS:  Left long finger trigger digit  POST-OPERATIVE DIAGNOSIS:  Same  PROCEDURE:  Left long finger trigger release  SURGEON: Cliffton Asters. Janee Morn, MD  PHYSICIAN ASSISTANT: None  ANESTHESIA:  local and MAC  SPECIMENS:  None  DRAINS:   None  PREOPERATIVE INDICATIONS:  [name] is a  62 y.o. female with a diagnosis of left long finger trigger digit who failed conservative measures and elected for surgical management.    The risks benefits and alternatives were discussed with the patient preoperatively including but not limited to the risks of infection, bleeding, nerve injury, cardiopulmonary complications, the need for revision surgery, among others, and the patient verbalized understanding and consented to proceed.  OPERATIVE IMPLANTS:  none  OPERATIVE FINDINGS: Slightly frayed tendon following release of A1 pulley  OPERATIVE PROCEDURE:  After receiving prophylactic antibiotics, the patient was escorted to the operative theatre and placed in a supine position.  A surgical "time-out" was performed during which the planned procedure, proposed operative site, and the correct patient identity were compared to the operative consent and agreement confirmed by the circulating nurse according to current facility policy.  The incision site was anesthetized with a mixture of lidocaine and Marcaine bearing epinephrine. Following application of a tourniquet to the operative extremity, the exposed skin was prepped with Chloraprep and draped in the usual sterile fashion.  The limb was exsanguinated with an Esmarch bandage and the tourniquet inflated to approximately higher than systolic BP.  An oblique incision was made exploiting the distal palmar crease over the base of the long finger. The skin was incised sharply with a scalpel. Subcutaneous tissues were dissected with blunt and spreading  dissection. Directly visualizing the A1 pulley with 4 power loupe magnification, the A1 pulley was incised on its midline and some additional crossing bands proximal to this was also incised. The tendons were pulled out into the field, inspected, and one was slightly frayed. It was debrided. The wound is copiously irrigated and the tourniquet was released. Additional hemostasis wasn't necessary and the skin was closed with 4-0 Vicryl Rapide interrupted sutures. A dressing was applied and she was taken to the recovery room  DISPOSITION: Patient discharged home today with typical postop instructions returning in 10-15 days for reassessment.

## 2012-09-28 NOTE — H&P (Signed)
[name] is an 62 y.o. female.   CC / Reason for Visit: Left long finger pain and locking HPI: This patient is a 62 year old female who presents for evaluation of problems involving the left hand.  She has a 10 year reported history of intermittent numbness and tingling in the hand that she thinks comes from her neck.  It has been relieved by chiropractic services.  Since January she has developed some pain and locking and catching of the left long finger.  Past Medical History  Diagnosis Date  . Hypertrophic cardiomyopathy   . HCV (hepatitis C virus)   . GERD (gastroesophageal reflux disease)     occasional; OTC as needed  . Trigger finger of left hand 09/2012    left long finger    Past Surgical History  Procedure Laterality Date  . Oophorectomy Right 1980  . Myomectomy  10/09/99    x 2  . Partial hysterectomy  2002    with no remaining ovary  . Ovarian cyst drainage  1980  . Cholecystectomy  07/17/2011    Procedure: LAPAROSCOPIC CHOLECYSTECTOMY WITH INTRAOPERATIVE CHOLANGIOGRAM;  Surgeon: Currie Paris, MD;  Location: [facility] OR;  Service: General;  Laterality: N/A;  . Oophorectomy Left 2000  . Breast reduction surgery Bilateral 2001  . Laceration repair Right 1996    thumb  . Foot ganglion excision Right   . Hemorrhoid surgery  1980    Family History  Problem Relation Age of Onset  . Colon cancer Father   . Diabetes Brother   . Colon cancer      Aunt  . Diabetes Sister   . Hypertension Brother   . Colon cancer Paternal Aunt   . Lung cancer Paternal Uncle   . Breast cancer Cousin   . Hepatitis C Brother    Social History:  reports that she has quit smoking. She has never used smokeless tobacco. She reports that she does not drink alcohol or use illicit drugs.  Allergies:  Allergies  Allergen Reactions  . Morphine Sulfate Swelling  . Penicillins Swelling    No prescriptions prior to admission    No results found for this or any previous visit (from the  past 48 hour(s)). No results found.  Review of Systems  All other systems reviewed and are negative.    Height 5\' 5"  (1.651 m), weight 81.647 kg (180 lb). Physical Exam  Constitutional:  WD, WN, NAD HEENT:  NCAT, EOMI Neuro/Psych:  Alert & oriented to person, place, and time; appropriate mood & affect Lymphatic: No generalized UE edema or lymphadenopathy Extremities / MSK:  Both UE are normal with respect to appearance, ranges of motion, joint stability, muscle strength/tone, sensation, & perfusion except as otherwise noted:  Tender over the A1 pulley of left long finger with inducible locking noted.  No symptoms with carpal compression test or Phalen's maneuver on the left side.  Labs / X-rays:  No radiographic studies obtained today.  Assessment: Left long finger trigger digit  Plan:  I initially recommended steroid injection for the patient.  She prefers not to proceed with steroid injection and to proceed directly with surgical release.  We will schedule this accordingly.  The details of the operative procedure were discussed with the patient.  Questions were invited and answered.  In addition to the goal of the procedure, the risks of the procedure to include but not limited to bleeding; infection; damage to the nerves or blood vessels that could result in bleeding, numbness,  weakness, chronic pain, and the need for additional procedures; stiffness; the need for revision surgery; and anesthetic risks, the worst of which is death, were reviewed.  No specific outcome was guaranteed or implied.  Informed consent was obtained.  Prescriptions for postoperative analgesia were also written.  A return appointment was not yet made.  Adalai Perl A. 09/28/2012, 12:23 AM

## 2012-09-28 NOTE — Anesthesia Postprocedure Evaluation (Signed)
  Anesthesia Post-op Note  Patient: [name]  Procedure(s) Performed: Procedure(s): RELEASE TRIGGER FINGER/A-1 PULLEY (Left)  Patient Location: PACU  Anesthesia Type:MAC  Level of Consciousness: awake, alert , oriented and patient cooperative  Airway and Oxygen Therapy: Patient Spontanous Breathing  Post-op Pain: none  Post-op Assessment: Post-op Vital signs reviewed, Patient's Cardiovascular Status Stable, Respiratory Function Stable, Patent Airway, No signs of Nausea or vomiting and Pain level controlled  Post-op Vital Signs: Reviewed and stable  Complications: No apparent anesthesia complications

## 2012-09-28 NOTE — Transfer of Care (Signed)
Immediate Anesthesia Transfer of Care Note  Patient: [name]  Procedure(s) Performed: Procedure(s): RELEASE TRIGGER FINGER/A-1 PULLEY (Left)  Patient Location: PACU  Anesthesia Type:MAC  Level of Consciousness: awake, alert , oriented and patient cooperative  Airway & Oxygen Therapy: Patient Spontanous Breathing and Patient connected to face mask oxygen  Post-op Assessment: Report given to PACU RN and Post -op Vital signs reviewed and stable  Post vital signs: Reviewed and stable  Complications: No apparent anesthesia complications

## 2012-09-28 NOTE — Interval H&P Note (Signed)
History and Physical Interval Note:  09/28/2012 7:55 AM  [name]  has presented today for surgery, with the diagnosis of LEFT LONG TRIGGER FINGER   The various methods of treatment have been discussed with the patient and family. After consideration of risks, benefits and other options for treatment, the patient has consented to  Procedure(s): RELEASE TRIGGER FINGER/A-1 PULLEY (Left) as a surgical intervention .  The patient's history has been reviewed, patient examined, no change in status, stable for surgery.  I have reviewed the patient's chart and labs.  Questions were answered to the patient's satisfaction.     Inaki Vantine A.

## 2012-09-28 NOTE — Anesthesia Preprocedure Evaluation (Addendum)
Anesthesia Evaluation  Patient identified by MRN, date of birth, ID band Patient awake    Reviewed: Allergy & Precautions, H&P , NPO status , Patient's Chart, lab work & pertinent test results  History of Anesthesia Complications Negative for: history of anesthetic complications  Airway Mallampati: I TM Distance: >3 FB Neck ROM: Full    Dental  (+) Dental Advisory Given   Pulmonary former smoker,  breath sounds clear to auscultation  Pulmonary exam normal       Cardiovascular hypertension, Pt. on medications + Peripheral Vascular Disease Rhythm:Regular Rate:Normal  '13 ECHO: EF 65-70%, valves OK   Neuro/Psych negative neurological ROS  negative psych ROS   GI/Hepatic GERD-  Medicated and Controlled,(+) Hepatitis -, CElevated LFTs   Endo/Other  Morbid obesity  Renal/GU negative Renal ROS     Musculoskeletal   Abdominal (+) + obese,   Peds  Hematology   Anesthesia Other Findings   Reproductive/Obstetrics                          Anesthesia Physical Anesthesia Plan  ASA: II  Anesthesia Plan: MAC   Post-op Pain Management:    Induction: Intravenous  Airway Management Planned: Natural Airway and Nasal Cannula  Additional Equipment:   Intra-op Plan:   Post-operative Plan:   Informed Consent: I have reviewed the patients History and Physical, chart, labs and discussed the procedure including the risks, benefits and alternatives for the proposed anesthesia with the patient or authorized representative who has indicated his/her understanding and acceptance.   Dental advisory given  Plan Discussed with: Surgeon and CRNA  Anesthesia Plan Comments: (Plan routine monitors, MAC)        Anesthesia Quick Evaluation

## 2012-09-28 NOTE — Anesthesia Procedure Notes (Signed)
Procedure Name: MAC Date/Time: 09/28/2012 7:32 AM Performed by: Wilhelmenia Addis D Pre-anesthesia Checklist: Patient identified, Emergency Drugs available, Suction available, Patient being monitored and Timeout performed Patient Re-evaluated:Patient Re-evaluated prior to inductionOxygen Delivery Method: Simple face mask

## 2012-09-29 ENCOUNTER — Encounter (HOSPITAL_BASED_OUTPATIENT_CLINIC_OR_DEPARTMENT_OTHER): Payer: Self-pay | Admitting: Orthopedic Surgery

## 2012-09-30 ENCOUNTER — Telehealth: Payer: Self-pay | Admitting: Internal Medicine

## 2012-09-30 ENCOUNTER — Ambulatory Visit: Payer: BC Managed Care – PPO | Admitting: Internal Medicine

## 2012-09-30 NOTE — Telephone Encounter (Signed)
No charge. 

## 2012-11-06 ENCOUNTER — Ambulatory Visit (INDEPENDENT_AMBULATORY_CARE_PROVIDER_SITE_OTHER): Payer: BC Managed Care – PPO | Admitting: Internal Medicine

## 2012-11-06 ENCOUNTER — Encounter: Payer: Self-pay | Admitting: Internal Medicine

## 2012-11-06 VITALS — BP 110/68 | HR 83 | Ht 65.0 in | Wt 187.0 lb

## 2012-11-06 DIAGNOSIS — K589 Irritable bowel syndrome without diarrhea: Secondary | ICD-10-CM

## 2012-11-06 DIAGNOSIS — R109 Unspecified abdominal pain: Secondary | ICD-10-CM

## 2012-11-06 DIAGNOSIS — B182 Chronic viral hepatitis C: Secondary | ICD-10-CM

## 2012-11-06 MED ORDER — DICYCLOMINE HCL 20 MG PO TABS: 20.0000 mg | ORAL_TABLET | Freq: Two times a day (BID) | ORAL | Status: DC

## 2012-11-06 NOTE — Progress Notes (Signed)
[name] [birth year] MRN [id number]  History of Present Illness:  This is a 62 year old [place] American female who moved here from [place]. She has hepatitis C genotype 1A and is followed in the hepatitis clinic. She will be starting treatment this October 2014. She has a history of irritable bowel syndrome and change in bowel habits. There is a family history of colon cancer in her father. Her last colonoscopy in January 2012 showed melanosis coli and hemorrhoids. She describes bloating and constipation as well as a bubbly type sensation across her upper abdomen which wakes her up at night. It may be relieved by having a bowel movement. These symptoms started in 2006 but have recently become worse. She wakes up at night and has to stand up and walk. The symptoms go away if she does not eat but are not necessarily precipitated by meals. She had a cholecystectomy for cholelithiasis in June 2013. She denies dysphagia or nausea. She is now Prilosec 20 mg daily.   Past Medical History  Diagnosis Date  . Hypertrophic cardiomyopathy   . HCV (hepatitis C virus)   . GERD (gastroesophageal reflux disease)     occasional; OTC as needed  . Trigger finger of left hand 09/2012    left long finger   Past Surgical History  Procedure Laterality Date  . Oophorectomy Right 1980  . Myomectomy  10/09/99    x 2  . Partial hysterectomy  2002    with no remaining ovary  . Ovarian cyst drainage  1980  . Cholecystectomy  07/17/2011    Procedure: LAPAROSCOPIC CHOLECYSTECTOMY WITH INTRAOPERATIVE CHOLANGIOGRAM;  Surgeon: Currie Paris, MD;  Location: [facility];  Service: General;  Laterality: N/A;  . Oophorectomy Left 2000  . Breast reduction surgery Bilateral 2001  . Laceration repair Right 1996    thumb  . Foot ganglion excision Right   . Hemorrhoid surgery  1980  . Trigger finger release Left 09/28/2012    Procedure: RELEASE TRIGGER FINGER/A-1 PULLEY;  Surgeon: Jodi Marble, MD;  Location: [facility];  Service: Orthopedics;  Laterality: Left;    reports that she quit smoking about 7 years ago. She has never used smokeless tobacco. She reports that she does not drink alcohol or use illicit drugs. family history includes Breast cancer in her cousin; Colon cancer in her father, paternal aunt, and unspecified family member; Diabetes in her brother and sister; Hepatitis C in her brother; Hypertension in her brother; and Lung cancer in her paternal uncle. Allergies  Allergen Reactions  . Morphine Sulfate Swelling  . Penicillins Swelling        Review of Systems:  The remainder of the 10 point ROS is negative except as outlined in H&P   Physical Exam: General appearance  Well developed, in no distress. Eyes- non icteric. HEENT nontraumatic, normocephalic. Mouth no lesions, tongue papillated, no cheilosis. Neck supple without adenopathy, thyroid not enlarged, no carotid bruits, no JVD. Lungs Clear to auscultation bilaterally. Cor normal S1, normal S2, regular rhythm, no murmur,  quiet precordium. Abdomen: Protuberant with normoactive bowel sounds. Mild tenderness in mid epigastrium. Liver edge at costal margin. There is no ascites. Rectal: Not done. Extremities no pedal edema. Skin no lesions. Neurological alert and oriented x 3. No asterixis  Psychological normal mood and affect.  Assessment and Plan:  Problem #2 62 year old [place] American female with abdominal pain and a tremor-like  sensations suggestive of colon spasm. We will put her on Bentyl 20 mg  twice a day. She is up-to-date on her colonoscopy. We will proceed with a CT scan of the abdomen and pelvis to look for a space occupying lesion or any abnormality that may explain her upper abdominal symptoms.  Problem #2 Family history of colorectal cancer in her father and indirect relatives. She will  be due for her recall colonoscopy in January 2017.  Problem #3 Hepatitis C genotype 1A. She is followed in  the hepatitis clinic.  Problem #4 Status post remote cholecystectomy for cholelithiasis.     11/06/2012 [name]

## 2012-11-06 NOTE — Patient Instructions (Addendum)
You have been scheduled for a CT scan of the abdomen and pelvis at  [facility] ([address]---this is in the same building as [facility]).   You are scheduled on Wednesday 11/11/12 at 2:30 pm. You should arrive 15 minutes prior to your appointment time for registration. Please follow the written instructions below on the day of your exam:  WARNING: IF YOU ARE ALLERGIC TO IODINE/X-RAY DYE, PLEASE NOTIFY RADIOLOGY IMMEDIATELY AT [phone]! YOU WILL BE GIVEN A 13 HOUR PREMEDICATION PREP.  1) Do not eat or drink anything after 10:30 am (4 hours prior to your test) 2) You have been given 2 bottles of oral contrast to drink. The solution may taste better if refrigerated, but do NOT add ice or any other liquid to this solution. Shake well before drinking.    Drink 1 bottle of contrast @ 12:30 pm (2 hours prior to your exam)  Drink 1 bottle of contrast @ 1:30 pm (1 hour prior to your exam)  You may take any medications as prescribed with a small amount of water except for the following: Metformin, Glucophage, Glucovance, Avandamet, Riomet, Fortamet, Actoplus Met, Janumet, Glumetza or Metaglip. The above medications must be held the day of the exam AND 48 hours after the exam.  The purpose of you drinking the oral contrast is to aid in the visualization of your intestinal tract. The contrast solution may cause some diarrhea. Before your exam is started, you will be given a small amount of fluid to drink. Depending on your individual set of symptoms, you may also receive an intravenous injection of x-ray contrast/dye. Plan on being at [facility] for 30 minutes or long, depending on the type of exam you are having performed.  This test typically takes 30-45 minutes to complete.  If you have any questions regarding your exam or if you need to reschedule, you may call the CT department at [phone] between the hours of 8:00 am and 5:00 pm,  Monday-Friday.  ________________________________________________________________________  We have sent the following medications to your pharmacy for you to pick up at your convenience: Bentyl 20 mg twice daily  CC: Dr Crawford Givens

## 2012-11-07 ENCOUNTER — Encounter: Payer: Self-pay | Admitting: Internal Medicine

## 2012-11-11 ENCOUNTER — Inpatient Hospital Stay: Admission: RE | Admit: 2012-11-11 | Payer: BC Managed Care – PPO | Source: Ambulatory Visit

## 2012-11-20 ENCOUNTER — Ambulatory Visit (INDEPENDENT_AMBULATORY_CARE_PROVIDER_SITE_OTHER)
Admission: RE | Admit: 2012-11-20 | Discharge: 2012-11-20 | Disposition: A | Discharge status: Home / Self Care | Payer: BC Managed Care – PPO | Source: Ambulatory Visit | Attending: Internal Medicine | Admitting: Internal Medicine

## 2012-11-20 DIAGNOSIS — R109 Unspecified abdominal pain: Secondary | ICD-10-CM

## 2012-11-20 MED ORDER — IOHEXOL 300 MG/ML  SOLN
100.0000 mL | Freq: Once | INTRAMUSCULAR | Status: AC | PRN
  Administered 2012-11-20: 100 mL via INTRAVENOUS

## 2012-12-15 ENCOUNTER — Ambulatory Visit: Payer: BC Managed Care – PPO | Admitting: Internal Medicine

## 2012-12-28 ENCOUNTER — Other Ambulatory Visit: Payer: Self-pay | Admitting: Family Medicine

## 2012-12-28 NOTE — Telephone Encounter (Signed)
Patient has not been seen for a CPE or labs in some time.  Please advise.

## 2012-12-28 NOTE — Telephone Encounter (Signed)
Sent, schedule CPE please.  Thanks.  

## 2012-12-29 NOTE — Telephone Encounter (Signed)
Patient advised.

## 2013-01-12 ENCOUNTER — Encounter: Payer: Self-pay | Admitting: Family Medicine

## 2013-01-12 ENCOUNTER — Ambulatory Visit (INDEPENDENT_AMBULATORY_CARE_PROVIDER_SITE_OTHER): Payer: BC Managed Care – PPO | Admitting: Family Medicine

## 2013-01-12 VITALS — BP 108/74 | HR 71 | Temp 97.9°F | Wt 179.8 lb

## 2013-01-12 DIAGNOSIS — IMO0002 Reserved for concepts with insufficient information to code with codable children: Secondary | ICD-10-CM

## 2013-01-12 DIAGNOSIS — S00411A Abrasion of right ear, initial encounter: Secondary | ICD-10-CM

## 2013-01-12 DIAGNOSIS — S00419A Abrasion of unspecified ear, initial encounter: Secondary | ICD-10-CM | POA: Insufficient documentation

## 2013-01-12 NOTE — Progress Notes (Signed)
R ear itching, scratched it, then pain on the R side of the neck (lasted about 4 days).  Some better with aleve.  No FCNAVD.  No typical ST but she felt a pain "in the gills" ie on the side of the neck. She doesn't have typical "sore throat" pain.    She is going to have HCV treatment this fall at [facility].    Meds, vitals, and allergies reviewed.   ROS: See HPI.  Otherwise, noncontributory.  GEN: nad, alert and oriented HEENT: mucous membranes moist, tm wnl B, canal B wnl, nasal and OP exam wnl NECK: supple w/o LA CV: rrr.  no murmur PULM: ctab, no inc wob

## 2013-01-12 NOTE — Assessment & Plan Note (Signed)
Likely, now resolved, no f/u needed.  F/u prn. She agrees.

## 2013-01-12 NOTE — Patient Instructions (Addendum)
Take care.  I think you just irritated your ear canal and it has already healed up.   Glad to see you.

## 2013-03-03 ENCOUNTER — Other Ambulatory Visit: Payer: Self-pay | Admitting: Family Medicine

## 2013-03-03 DIAGNOSIS — I10 Essential (primary) hypertension: Secondary | ICD-10-CM

## 2013-03-10 ENCOUNTER — Other Ambulatory Visit (INDEPENDENT_AMBULATORY_CARE_PROVIDER_SITE_OTHER): Payer: BC Managed Care – PPO

## 2013-03-10 DIAGNOSIS — I10 Essential (primary) hypertension: Secondary | ICD-10-CM

## 2013-03-10 DIAGNOSIS — E7800 Pure hypercholesterolemia, unspecified: Secondary | ICD-10-CM

## 2013-03-10 LAB — COMPREHENSIVE METABOLIC PANEL
AST: 51 U/L — ABNORMAL HIGH (ref 0–37)
Albumin: 3.7 g/dL (ref 3.5–5.2)
BUN: 7 mg/dL (ref 6–23)
Calcium: 9.5 mg/dL (ref 8.4–10.5)
Chloride: 104 mEq/L (ref 96–112)
Glucose, Bld: 101 mg/dL — ABNORMAL HIGH (ref 70–99)
Potassium: 3.9 mEq/L (ref 3.5–5.1)
Total Protein: 7.5 g/dL (ref 6.0–8.3)

## 2013-03-10 LAB — LIPID PANEL
Total CHOL/HDL Ratio: 4
Triglycerides: 108 mg/dL (ref 0.0–149.0)

## 2013-03-15 ENCOUNTER — Other Ambulatory Visit: Payer: Self-pay

## 2013-03-15 DIAGNOSIS — Z1231 Encounter for screening mammogram for malignant neoplasm of breast: Secondary | ICD-10-CM

## 2013-03-17 ENCOUNTER — Encounter: Payer: BC Managed Care – PPO | Admitting: Family Medicine

## 2013-03-18 ENCOUNTER — Encounter: Payer: Self-pay | Admitting: Family Medicine

## 2013-03-18 ENCOUNTER — Ambulatory Visit (INDEPENDENT_AMBULATORY_CARE_PROVIDER_SITE_OTHER): Payer: BC Managed Care – PPO | Admitting: Family Medicine

## 2013-03-18 VITALS — BP 110/80 | HR 74 | Temp 98.0°F | Ht 65.5 in | Wt 184.0 lb

## 2013-03-18 DIAGNOSIS — Z0000 Encounter for general adult medical examination without abnormal findings: Secondary | ICD-10-CM

## 2013-03-18 DIAGNOSIS — I10 Essential (primary) hypertension: Secondary | ICD-10-CM

## 2013-03-18 DIAGNOSIS — R109 Unspecified abdominal pain: Secondary | ICD-10-CM

## 2013-03-18 DIAGNOSIS — R29818 Other symptoms and signs involving the nervous system: Secondary | ICD-10-CM

## 2013-03-18 DIAGNOSIS — R911 Solitary pulmonary nodule: Secondary | ICD-10-CM

## 2013-03-18 DIAGNOSIS — B1920 Unspecified viral hepatitis C without hepatic coma: Secondary | ICD-10-CM

## 2013-03-18 DIAGNOSIS — Z23 Encounter for immunization: Secondary | ICD-10-CM

## 2013-03-18 DIAGNOSIS — R2689 Other abnormalities of gait and mobility: Secondary | ICD-10-CM

## 2013-03-18 NOTE — Progress Notes (Signed)
CPE- See plan.  Routine anticipatory guidance given to patient.  See health maintenance. Tetanus 2006 Flu shot yearly Shingles vaccine deferred for now.  Colonoscopy 2012 Pap not indicated. S/p hysterectomy.   Mammogram due for this year. She is scheduled already.  Diet and exercise d/w pt.  Encouraged.  She is going to get back to exercising. Living will d/w pt.  Encouraged. Son [name] would be designated.    HCV per Dr. Jacqualine Prince.  She has f/u pending.   LVH.  Per cards. No ADE on meds. Will f/u with cards later in the year.  No CP.   Balance changes.  Noted in the last 6 months.  Not vertigo.  Chronic.  No falls but feels less steady.  No focal changes, ie focal weakness or slurred speech.   Prev abd pain.  She does better if she eats regularly.  She does worse with peanut butter, crackers, and tortilla chips.  She does okay if avoiding those foods.  Mild abd LA.  Prev saw Dr. Juanda Prince.    Pulmonary nodules.  Noted on CT, incidentally.  FH lung CA.  Former smoker.  Discussed CT for eval.  No sx.   PMH and SH reviewed  Meds, vitals, and allergies reviewed.   ROS: See HPI.  Otherwise negative.    GEN: nad, alert and oriented HEENT: mucous membranes moist NECK: supple w/o LA CV: rrr. PULM: ctab, no inc wob ABD: soft, +bs EXT: no edema SKIN: no acute rash CN 2-12 wnl B, S/S/DTR wnl x4 but balance is slightly diminished with standing, arms extended and eyes closed.

## 2013-03-18 NOTE — Patient Instructions (Addendum)
Ask Dr. Jacqualine Mau about getting the shingles vaccine and the pneumonia shot.   Let me talk to Dr. Juanda Chance and we'll be in touch.  Take care.

## 2013-03-19 ENCOUNTER — Telehealth: Payer: Self-pay | Admitting: Family Medicine

## 2013-03-19 DIAGNOSIS — R2689 Other abnormalities of gait and mobility: Secondary | ICD-10-CM | POA: Insufficient documentation

## 2013-03-19 DIAGNOSIS — R911 Solitary pulmonary nodule: Secondary | ICD-10-CM | POA: Insufficient documentation

## 2013-03-19 NOTE — Telephone Encounter (Signed)
Call pt.  I would check CT head given the balance changes.  The CT chest is ordered.   I sent a note to Dr. Juanda Chance about the repeat abd imaging.  I want her input.  If needed, she or I can order this.   We'll try to get it all set up at the same time.   Then route to [place].  Thanks.

## 2013-03-19 NOTE — Telephone Encounter (Signed)
Patient advised.  Do we need to hold this to await response from Dr. Juanda Chance?

## 2013-03-19 NOTE — Assessment & Plan Note (Signed)
nonfocal exam o/w.  Given the persistence and lack of inner ear sx, would check CT head.

## 2013-03-19 NOTE — Assessment & Plan Note (Signed)
Per [facility] clinic.

## 2013-03-19 NOTE — Assessment & Plan Note (Signed)
Resolved now, I'll ask Dr. Juanda Chance about input on f/u abd imaging.

## 2013-03-19 NOTE — Telephone Encounter (Signed)
I would try to get this set up for later next week and that should allow time for response from Dr. Juanda Chance.

## 2013-03-19 NOTE — Assessment & Plan Note (Signed)
Controlled, continue current meds.   

## 2013-03-19 NOTE — Assessment & Plan Note (Signed)
Noted on CT, incidentally. FH lung CA. Former smoker. Discussed CT for eval. No sx.  Will arrange.

## 2013-03-19 NOTE — Assessment & Plan Note (Signed)
Routine anticipatory guidance given to patient.  See health maintenance. Tetanus 2006 Flu shot yearly Shingles vaccine deferred for now.  Colonoscopy 2012 Pap not indicated. S/p hysterectomy.   Mammogram due for this year. She is scheduled already.  Diet and exercise d/w pt.  Encouraged.  She is going to get back to exercising. Living will d/w pt.  Encouraged. Son [name] would be designated.

## 2013-03-20 ENCOUNTER — Telehealth: Payer: Self-pay | Admitting: Family Medicine

## 2013-03-20 DIAGNOSIS — R590 Localized enlarged lymph nodes: Secondary | ICD-10-CM

## 2013-03-20 NOTE — Telephone Encounter (Signed)
I talked with Dr. Juanda Chance.  Would proceed with f/u CT abdomen with contrast. The orders are in.  I would like to get all of them done (head and chest too) done at the same time.  Thanks.

## 2013-03-22 NOTE — Telephone Encounter (Signed)
[name] has scheduled and informed patient.

## 2013-03-23 ENCOUNTER — Ambulatory Visit: Payer: BC Managed Care – PPO

## 2013-03-24 NOTE — Telephone Encounter (Signed)
See following note. 

## 2013-03-26 ENCOUNTER — Ambulatory Visit (INDEPENDENT_AMBULATORY_CARE_PROVIDER_SITE_OTHER)
Admission: RE | Admit: 2013-03-26 | Discharge: 2013-03-26 | Disposition: A | Discharge status: Home / Self Care | Payer: BC Managed Care – PPO | Source: Ambulatory Visit | Attending: Family Medicine | Admitting: Family Medicine

## 2013-03-26 DIAGNOSIS — R911 Solitary pulmonary nodule: Secondary | ICD-10-CM

## 2013-03-26 DIAGNOSIS — R2689 Other abnormalities of gait and mobility: Secondary | ICD-10-CM

## 2013-03-26 DIAGNOSIS — R29818 Other symptoms and signs involving the nervous system: Secondary | ICD-10-CM

## 2013-03-26 DIAGNOSIS — R590 Localized enlarged lymph nodes: Secondary | ICD-10-CM

## 2013-03-26 DIAGNOSIS — R599 Enlarged lymph nodes, unspecified: Secondary | ICD-10-CM

## 2013-03-26 MED ORDER — IOHEXOL 300 MG/ML  SOLN
100.0000 mL | Freq: Once | INTRAMUSCULAR | Status: AC | PRN
  Administered 2013-03-26: 100 mL via INTRAVENOUS

## 2013-03-31 ENCOUNTER — Ambulatory Visit
Admission: RE | Admit: 2013-03-31 | Discharge: 2013-03-31 | Disposition: A | Discharge status: Home / Self Care | Payer: BC Managed Care – PPO | Source: Ambulatory Visit

## 2013-03-31 DIAGNOSIS — Z1231 Encounter for screening mammogram for malignant neoplasm of breast: Secondary | ICD-10-CM

## 2013-04-02 ENCOUNTER — Encounter: Payer: Self-pay | Admitting: *Deleted

## 2013-04-20 ENCOUNTER — Ambulatory Visit: Payer: BC Managed Care – PPO

## 2013-04-22 ENCOUNTER — Encounter: Payer: Self-pay | Admitting: Family Medicine

## 2013-04-22 ENCOUNTER — Ambulatory Visit: Payer: BC Managed Care – PPO

## 2013-04-22 ENCOUNTER — Telehealth: Payer: Self-pay | Admitting: Family Medicine

## 2013-04-22 NOTE — Telephone Encounter (Signed)
Yes, I will try to work with her whenever it works for her.  I will apologize to her but I couldn't do but so much in order to keep my MD on schedule.   Thank you for handling it professionally.  I am not here on Wednesdays but I'll do all I can any other day.

## 2013-04-22 NOTE — Telephone Encounter (Signed)
The patient checked in for her injections, but decided to leave due to wait time.  She stated she waited 20 min for a shot, and that was not acceptable due to her having to go to work.  I apologized for the delay, and she stated she would call back to re-scheduled.   Is it possible to work this patient in for a shot earlier than what is open on the nurses schedule?  She is hoping to come in before the start of her shift one day of the week.    Thanks!

## 2013-04-27 ENCOUNTER — Ambulatory Visit (INDEPENDENT_AMBULATORY_CARE_PROVIDER_SITE_OTHER): Payer: BC Managed Care – PPO

## 2013-04-27 DIAGNOSIS — Z2911 Encounter for prophylactic immunotherapy for respiratory syncytial virus (RSV): Secondary | ICD-10-CM

## 2013-04-27 DIAGNOSIS — Z23 Encounter for immunization: Secondary | ICD-10-CM

## 2013-04-27 NOTE — Progress Notes (Signed)
  Subjective:    Patient ID: [name], female    DOB: 1951/01/17, 62 y.o.   MRN: [id number]  HPI    Review of Systems     Objective:   Physical Exam        Assessment & Plan:  Pt did check with Dr Jacqualine Mau and was told it was OK for pt to get pneumonia and shingles vaccine.

## 2013-07-09 ENCOUNTER — Encounter: Payer: Self-pay | Admitting: Cardiology

## 2013-07-09 ENCOUNTER — Ambulatory Visit (INDEPENDENT_AMBULATORY_CARE_PROVIDER_SITE_OTHER): Payer: BC Managed Care – PPO | Admitting: Cardiology

## 2013-07-09 VITALS — BP 110/80 | HR 69 | Ht 66.0 in | Wt 188.0 lb

## 2013-07-09 DIAGNOSIS — R002 Palpitations: Secondary | ICD-10-CM

## 2013-07-09 NOTE — Progress Notes (Signed)
HPI The patient presents for followup of mild ventricular hypertrophy. She's also had palpitations. An echo in 2010 suggested moderate mitral regurgitation directed posteriorly. There was LVH. This was severe. She is also had some aortic insufficiency. However followup echocardiography in 2013 suggested only mild mitral regurgitation and no significant ventricular hypertrophy or aortic insufficiency.  She presents for followup and says that she's done well.   However, she does describe palpitations. These happen almost daily. These are brief but she does get lightheaded and she does lose her peripheral vision.   She has not had any frank syncope.  The patient denies any new symptoms such as chest discomfort, neck or arm discomfort. There has been no new shortness of breath, PND or orthopnea. She does walk 3 x per week.  Allergies  Allergen Reactions  . Morphine Sulfate Swelling  . Penicillins Swelling    Current Outpatient Prescriptions  Medication Sig Dispense Refill  . Cholecalciferol (VITAMIN D PO) Take 1 tablet by mouth daily.       . cyanocobalamin 1000 MCG tablet Take 100 mcg by mouth daily.      Marland Kitchen gelatin 650 MG capsule Take 650 mg by mouth 2 (two) times daily.      . verapamil (CALAN-SR) 240 MG CR tablet TAKE 1 TABLET BY MOUTH ONCE A DAY  90 tablet  1   No current facility-administered medications for this visit.    Past Medical History  Diagnosis Date  . Hypertrophic cardiomyopathy   . HCV (hepatitis C virus)   . GERD (gastroesophageal reflux disease)     occasional; OTC as needed  . Trigger finger of left hand 09/2012    left long finger    Past Surgical History  Procedure Laterality Date  . Oophorectomy Right 1980  . Myomectomy  10/09/99    x 2  . Partial hysterectomy  2002    with no remaining ovary  . Ovarian cyst drainage  1980  . Cholecystectomy  07/17/2011    Procedure: LAPAROSCOPIC CHOLECYSTECTOMY WITH INTRAOPERATIVE CHOLANGIOGRAM;  Surgeon: Haywood Lasso,  MD;  Location: [facility];  Service: General;  Laterality: N/A;  . Oophorectomy Left 2000  . Breast reduction surgery Bilateral 2001  . Laceration repair Right 1996    thumb  . Foot ganglion excision Right   . Hemorrhoid surgery  1980  . Trigger finger release Left 09/28/2012    Procedure: RELEASE TRIGGER FINGER/A-1 PULLEY;  Surgeon: Jolyn Nap, MD;  Location: [facility];  Service: Orthopedics;  Laterality: Left;    ROS:  As stated in the HPI and negative for all other systems.  PHYSICAL EXAM BP 110/80  Pulse 69  Ht 5\' 6"  (1.676 m)  Wt 188 lb (85.276 kg)  BMI 30.36 kg/m2 GENERAL:  Well appearing NECK:  No jugular venous distention, waveform within normal limits, carotid upstroke brisk and symmetric, no bruits, no thyromegaly LYMPHATICS:  No cervical, inguinal adenopathy LUNGS:  Clear to auscultation bilaterally BACK:  No CVA tenderness CHEST:  Unremarkable HEART:  PMI not displaced or sustained,S1 and S2 within normal limits, no S3, no S4, no clicks, no rubs, no murmurs ABD:  Flat, positive bowel sounds normal in frequency in pitch, no bruits, no rebound, no guarding, no midline pulsatile mass, no hepatomegaly, no splenomegaly EXT:  2 plus pulses throughout, no edema, no cyanosis no clubbing  EKG:  Sinus rhythm, rate 69, axis within normal limits, intervals within normal limits, no acute ST-T wave changes. 07/09/2013  ASSESSMENT AND PLAN  LVH:  This was mild on the last echo.  I do not suspect that further testing is indicated.  We will continue with BP control.  HTN:  The blood pressure is at target. No change in medications is indicated. We will continue with therapeutic lifestyle changes (TLC).  PALPITATIONS:  I will apply a 48 hour Holter for evaluation of palpitations.  MR:   I would have no reason to suspect this was worse than previous and will follow this clinically.

## 2013-07-09 NOTE — Patient Instructions (Addendum)
Your physician wants you to follow-up in:  Ama will receive a reminder letter in the mail two months in advance. If you don't receive a letter, please call our office to schedule the follow-up appointment. Your physician recommends that you continue on your current medications as directed. Please refer to the Current Medication list given to you today.  Your physician has recommended that you wear a holter monitor. Holter monitors are medical devices that record the heart's electrical activity. Doctors most often use these monitors to diagnose arrhythmias. Arrhythmias are problems with the speed or rhythm of the heartbeat. The monitor is a small, portable device. You can wear one while you do your normal daily activities. This is usually used to diagnose what is causing palpitations/syncope (passing out).  [employer]

## 2013-08-02 ENCOUNTER — Other Ambulatory Visit: Payer: Self-pay | Admitting: Family Medicine

## 2013-08-23 ENCOUNTER — Other Ambulatory Visit: Payer: Self-pay

## 2013-08-23 MED ORDER — HYDROCORTISONE ACETATE 25 MG RE SUPP: 25.0000 mg | Freq: Two times a day (BID) | RECTAL | Status: DC | PRN

## 2013-08-23 NOTE — Telephone Encounter (Signed)
Pt has hemorrhoids periodically and does not have hemorrhoids now but wants to keep on hand. Pt request refill anusol HC 25 mg suppository to [facility]. Under meds and orders need how often to use prn.

## 2013-08-23 NOTE — Telephone Encounter (Signed)
Sent!

## 2013-08-25 NOTE — Telephone Encounter (Signed)
Patient notified by telephone that script has been sent to the pharmacy. 

## 2013-09-14 ENCOUNTER — Encounter: Payer: Self-pay | Admitting: Family Medicine

## 2013-09-14 ENCOUNTER — Ambulatory Visit (INDEPENDENT_AMBULATORY_CARE_PROVIDER_SITE_OTHER): Payer: BC Managed Care – PPO | Admitting: Family Medicine

## 2013-09-14 VITALS — BP 122/84 | HR 75 | Temp 98.0°F | Wt 185.2 lb

## 2013-09-14 DIAGNOSIS — M7660 Achilles tendinitis, unspecified leg: Secondary | ICD-10-CM

## 2013-09-14 NOTE — Progress Notes (Signed)
Pre visit review using our clinic review tool, if applicable. No additional management support is needed unless otherwise documented below in the visit note.  L foot pain.  Started about 1 month.  No clear trigger. Not worse but not better over the last month.  No pain at rest, if not weight bearing.  Pain at insertion of L achilles.  No trauma.  No bruising.  She had worn some new heels.    She continues to have abd pain and I asked her to check with Dr. Olevia Perches.   Meds, vitals, and allergies reviewed.   ROS: See HPI.  Otherwise, noncontributory.  nad L foot not ttp at all except at the insertion of the achilles. No tendon deficit.  Not ttp on bony prominences o/w.  NV intact distally.  No swelling or bruising.

## 2013-09-14 NOTE — Patient Instructions (Signed)
Get a heel wedge in both shoes.  Ice the area and gently stretch.  If not better, then we may need to put you in a boot.   Take care. Call Dr. Olevia Perches in the meantime.

## 2013-09-15 DIAGNOSIS — M7660 Achilles tendinitis, unspecified leg: Secondary | ICD-10-CM | POA: Insufficient documentation

## 2013-09-15 NOTE — Assessment & Plan Note (Signed)
Would use a heel lift since her sx are mild enough not to need a full boot.  D/w pt about icing and stretching gently.  If not improved, she may need a CAM walker boot.  D/w pt. She agrees. No need to image today. No sign of tendon deficit. Able to do a heel lift on L side.

## 2013-09-23 ENCOUNTER — Telehealth: Payer: Self-pay | Admitting: Family Medicine

## 2013-09-23 DIAGNOSIS — R918 Other nonspecific abnormal finding of lung field: Secondary | ICD-10-CM

## 2013-09-23 NOTE — Telephone Encounter (Signed)
Call pt.  We didn't talk about it at the [facility] but she is due for f/u CT chest re: pulmonary nodules.  I put in the order.  Thanks.

## 2013-09-24 NOTE — Telephone Encounter (Signed)
Patient advised.

## 2013-10-04 ENCOUNTER — Ambulatory Visit (INDEPENDENT_AMBULATORY_CARE_PROVIDER_SITE_OTHER)
Admission: RE | Admit: 2013-10-04 | Discharge: 2013-10-04 | Disposition: A | Discharge status: Home / Self Care | Payer: BC Managed Care – PPO | Source: Ambulatory Visit | Attending: Family Medicine | Admitting: Family Medicine

## 2013-10-04 ENCOUNTER — Encounter: Payer: Self-pay | Admitting: Family Medicine

## 2013-10-04 DIAGNOSIS — R918 Other nonspecific abnormal finding of lung field: Secondary | ICD-10-CM

## 2013-10-20 ENCOUNTER — Ambulatory Visit: Payer: BC Managed Care – PPO | Admitting: Internal Medicine

## 2013-11-18 ENCOUNTER — Other Ambulatory Visit (INDEPENDENT_AMBULATORY_CARE_PROVIDER_SITE_OTHER): Payer: BC Managed Care – PPO

## 2013-11-18 ENCOUNTER — Ambulatory Visit (INDEPENDENT_AMBULATORY_CARE_PROVIDER_SITE_OTHER): Payer: BC Managed Care – PPO | Admitting: Internal Medicine

## 2013-11-18 ENCOUNTER — Encounter: Payer: Self-pay | Admitting: Internal Medicine

## 2013-11-18 VITALS — BP 100/70 | HR 84 | Ht 65.35 in | Wt 186.5 lb

## 2013-11-18 DIAGNOSIS — K5732 Diverticulitis of large intestine without perforation or abscess without bleeding: Secondary | ICD-10-CM

## 2013-11-18 DIAGNOSIS — K5289 Other specified noninfective gastroenteritis and colitis: Secondary | ICD-10-CM

## 2013-11-18 DIAGNOSIS — R195 Other fecal abnormalities: Secondary | ICD-10-CM

## 2013-11-18 LAB — CBC WITH DIFFERENTIAL/PLATELET
Basophils Absolute: 0 10*3/uL (ref 0.0–0.1)
Basophils Relative: 0.4 % (ref 0.0–3.0)
EOS ABS: 0.1 10*3/uL (ref 0.0–0.7)
Eosinophils Relative: 0.7 % (ref 0.0–5.0)
HCT: 39.7 % (ref 36.0–46.0)
Hemoglobin: 13.1 g/dL (ref 12.0–15.0)
LYMPHS PCT: 23.5 % (ref 12.0–46.0)
Lymphs Abs: 1.7 10*3/uL (ref 0.7–4.0)
MCHC: 33.1 g/dL (ref 30.0–36.0)
MCV: 84.7 fl (ref 78.0–100.0)
MONO ABS: 0.6 10*3/uL (ref 0.1–1.0)
Monocytes Relative: 8 % (ref 3.0–12.0)
NEUTROS PCT: 67.4 % (ref 43.0–77.0)
Neutro Abs: 4.9 10*3/uL (ref 1.4–7.7)
PLATELETS: 202 10*3/uL (ref 150.0–400.0)
RBC: 4.68 Mil/uL (ref 3.87–5.11)
RDW: 13.4 % (ref 11.5–15.5)
WBC: 7.3 10*3/uL (ref 4.0–10.5)

## 2013-11-18 LAB — IBC PANEL
IRON: 59 ug/dL (ref 42–145)
Saturation Ratios: 13 % — ABNORMAL LOW (ref 20.0–50.0)
TRANSFERRIN: 323 mg/dL (ref 212.0–360.0)

## 2013-11-18 LAB — VITAMIN B12: Vitamin B-12: 1099 pg/mL — ABNORMAL HIGH (ref 211–911)

## 2013-11-18 LAB — SEDIMENTATION RATE: Sed Rate: 20 mm/hr (ref 0–22)

## 2013-11-18 MED ORDER — CIPROFLOXACIN HCL 500 MG PO TABS: 500.0000 mg | ORAL_TABLET | Freq: Two times a day (BID) | ORAL | Status: DC

## 2013-11-18 MED ORDER — DICYCLOMINE HCL 10 MG PO CAPS: 10.0000 mg | ORAL_CAPSULE | Freq: Three times a day (TID) | ORAL | Status: DC

## 2013-11-18 NOTE — Progress Notes (Signed)
[name] 1951/03/11 [id number]  Note: This dictation was prepared with Dragon digital system. Any transcriptional errors that result from this procedure are unintentional.   History of Present Illness:  This is a 63 year old African American female with left lower quadrant abdominal pain which started several weeks ago. She has had chronic gastrointestinal problems consistent with irritable bowel syndrome for many years. Her last colonoscopy in January 2012 showed melanosis and hemorrhoids. She has a positive family history of colon cancer in her father and his sister. She has been passing pinkish mucous and has been having loose stools. She has been taking Herb-lax daily for many years but did not know this was a stimulant. She thought it was a fiber supplement. Her stools have been runny and urgent. She underwent a laparoscopic cholecystectomy in June 2013 for cholelithiasis. She also has a history of hepatitis C genotype 1A followed by the hepatitis clinic. A CT scan of the abdomen in September 2014 showed stable hepatoduodenal lymph nodes with no evidence of malignancy.    Past Medical History  Diagnosis Date  . Hypertrophic cardiomyopathy   . HCV (hepatitis C virus)   . GERD (gastroesophageal reflux disease)     occasional; OTC as needed  . Trigger finger of left hand 09/2012    left long finger    Past Surgical History  Procedure Laterality Date  . Oophorectomy Right 1980  . Myomectomy  10/09/99    x 2  . Partial hysterectomy  2002    with no remaining ovary  . Ovarian cyst drainage  1980  . Cholecystectomy  07/17/2011    Procedure: LAPAROSCOPIC CHOLECYSTECTOMY WITH INTRAOPERATIVE CHOLANGIOGRAM;  Surgeon: Haywood Lasso, MD;  Location: [facility];  Service: General;  Laterality: N/A;  . Oophorectomy Left 2000  . Breast reduction surgery Bilateral 2001  . Laceration repair Right 1996    thumb  . Foot ganglion excision Right   . Hemorrhoid surgery  1980  . Trigger finger  release Left 09/28/2012    Procedure: RELEASE TRIGGER FINGER/A-1 PULLEY;  Surgeon: Jolyn Nap, MD;  Location: [facility];  Service: Orthopedics;  Laterality: Left;    Allergies  Allergen Reactions  . Morphine Sulfate Swelling  . Penicillins Swelling    Family history and social history have been reviewed.  Review of Systems: Occasional heartburn for which she takes a teenager and water. No fever or weight loss  The remainder of the 10 point ROS is negative except as outlined in the H&P  Physical Exam: General Appearance Well developed, in no distress Eyes  Non icteric  HEENT  Non traumatic, normocephalic  Mouth No lesion, tongue papillated, no cheilosis Neck Supple without adenopathy, thyroid not enlarged, no carotid bruits, no JVD Lungs Clear to auscultation bilaterally COR Normal S1, normal S2, regular rhythm, no murmur, quiet precordium Abdomen soft but very tender in left lower quadrant. No fullness or rebound. Bowel sounds are active. There is no CVA tenderness Rectal soft yellow Hemoccult-positive stool Extremities  No pedal edema Skin No lesions Neurological Alert and oriented x 3 Psychological Normal mood and affect  Assessment and Plan:   Problem #1 Exacerbation of irritable bowel syndrome versus diverticulitis versus segmental colitis. She has been taking herb-lax but was not aware of taking laxatives which have been contributing to loose stools and colon spasm. She will discontinue the herbal laxatives and instead take Metamucil 1 heaping teaspoon daily. We will start her on Bentyl 10 mg 3 times a day before  meals and Cipro 500 mg twice a day for a week. We are going to hold off on repeating a CT scan which was done just 6 months ago. She will stay on full liquids and a soft diet for the next 48 hour for bowel rest.   Problem #2 We will check her CBC, sedimentation rate, iron studies and B12 levels. She will be due for a recall colonoscopy in January  2017 or before that if her stools remain heme positive. She will call us in one week with an update.  Problem #3 Patient is status post laparoscopic cholecystectomy in June 2013.  Problem #4 Positive family history of colon cancer in her father and paternal aunt. A recall colonoscopy will be due in January 2017.    [place] Dragon 11/18/2013

## 2013-11-18 NOTE — Patient Instructions (Addendum)
We have sent the following medications to your pharmacy for you to pick up at your convenience: Cipro 500 mg twice daily Bentyl 10 mg three times daily before meals  Please discontinue Herb-lax.  Please purchase the following medications over the counter and take as directed: Metamucil-Take 1 heaping teaspoon daily  Your physician has requested that you go to the basement for the following lab work before leaving today: CBC, IBC, B12, Sed Rate  Please call our office in 1 week with a status update. Ask to speak to [place] at [phone].  Full liquid diet x 48 hours.  CC: [name]

## 2014-01-29 ENCOUNTER — Other Ambulatory Visit: Payer: Self-pay | Admitting: Family Medicine

## 2014-03-15 ENCOUNTER — Encounter: Payer: Self-pay | Admitting: Family Medicine

## 2014-03-15 ENCOUNTER — Ambulatory Visit (INDEPENDENT_AMBULATORY_CARE_PROVIDER_SITE_OTHER): Payer: Medicare Other | Admitting: Family Medicine

## 2014-03-15 VITALS — BP 110/68 | HR 79 | Temp 98.2°F | Wt 189.0 lb

## 2014-03-15 DIAGNOSIS — R209 Unspecified disturbances of skin sensation: Secondary | ICD-10-CM

## 2014-03-15 DIAGNOSIS — R202 Paresthesia of skin: Secondary | ICD-10-CM

## 2014-03-15 DIAGNOSIS — Z23 Encounter for immunization: Secondary | ICD-10-CM

## 2014-03-15 NOTE — Patient Instructions (Signed)
Use a lumbar support for your back and this should resolve.  It looks to be nerve/nerve root compression, likely positionally induced.  Take care.  Glad to see you.

## 2014-03-15 NOTE — Progress Notes (Signed)
Pre visit review using our clinic review tool, if applicable. No additional management support is needed unless otherwise documented below in the visit note.  She is going to f/u with [facility] hepatology soon re HCV.   R foot sx about 1 month.  Sensation is still normal in the foot. No h/o DM2. Uncomfortable but not painfut. She has the sensation of pins and needles occ.  At the 1st-3rd toes and distal 1st-3rd MTs, dorsal and plantar sides. No weakness.  No lateral sx.  No L foot sx except for L 2nd toe with similar findings.  No noted more/less with shoe change vs barefoot.  Noted more sitting down, can induce the sx with sitting an positioning her feet in a particular way on a foot stool.  No back pain.    GI sx return with peanut butter o/w no sx.  Much improved off fiber that had senna.  Meds, vitals, and allergies reviewed.   ROS: See HPI.  Otherwise, noncontributory.  nad Back not ttp, no midline pain NV intact for the BLE Slightly loss of arch in the R foot with weight bearing but otherwise normal inspection.  Normal DP pulse Sensation intact for the BLE, no motor deficit.

## 2014-03-16 DIAGNOSIS — R202 Paresthesia of skin: Secondary | ICD-10-CM | POA: Insufficient documentation

## 2014-03-16 NOTE — Assessment & Plan Note (Signed)
Could be peripheral nerve compression in the foot, but more likely from nerve root compression in the back that is positional.  D/w pt.  She'll get a lumbar support and f/u prn.  She agrees.

## 2014-05-04 ENCOUNTER — Ambulatory Visit (INDEPENDENT_AMBULATORY_CARE_PROVIDER_SITE_OTHER): Payer: Medicare Other | Admitting: Family Medicine

## 2014-05-04 ENCOUNTER — Encounter: Payer: Self-pay | Admitting: Family Medicine

## 2014-05-04 VITALS — BP 116/70 | HR 70 | Temp 98.3°F | Wt 189.2 lb

## 2014-05-04 DIAGNOSIS — M545 Low back pain, unspecified: Secondary | ICD-10-CM | POA: Insufficient documentation

## 2014-05-04 DIAGNOSIS — H6990 Unspecified Eustachian tube disorder, unspecified ear: Secondary | ICD-10-CM | POA: Insufficient documentation

## 2014-05-04 DIAGNOSIS — H6980 Other specified disorders of Eustachian tube, unspecified ear: Secondary | ICD-10-CM | POA: Insufficient documentation

## 2014-05-04 DIAGNOSIS — H6981 Other specified disorders of Eustachian tube, right ear: Secondary | ICD-10-CM

## 2014-05-04 MED ORDER — FLUTICASONE PROPIONATE 50 MCG/ACT NA SUSP: 2.0000 | Freq: Every day | NASAL | Status: DC

## 2014-05-04 MED ORDER — CYCLOBENZAPRINE HCL 10 MG PO TABS: 5.0000 mg | ORAL_TABLET | Freq: Three times a day (TID) | ORAL | Status: DC | PRN

## 2014-05-04 NOTE — Progress Notes (Signed)
Pre visit review using our clinic review tool, if applicable. No additional management support is needed unless otherwise documented below in the visit note.  She had f/u with [facility] re: HCV.    Sensation of ear vibration, that she can hear in the R ear.  More sensitive to sound on R ear.  No FCNAVD.  No ST.  No rhinorrhea.  Nose is stuffy.  No sx on the L side, nothing like the R side.  She can't pop her ears.    Back pain started Monday.  She had done some lifting previously.  No pain at the time.  Sore in the meantime, afterward.  More pain leaning over.  R lower back pain, no pain on the L side.  No radicular leg pain.  No weakness.  "A catch" when I stand up.  No pain laying down.  Sitting causes less pain then standing.  Most pain standing up straight.    Meds, vitals, and allergies reviewed.   ROS: See HPI.  Otherwise, noncontributory.  nad ncat TM with normal inspection but no movement on R TM with valsalva Nasal exam stuffy OP wnl Neck supple, no LA rrr ctab abd soft Back w/o midline pain, R lower back ttp, no CVA pain.  Distally NV intact

## 2014-05-04 NOTE — Patient Instructions (Signed)
Ice and heat, stretching for you lower back. Use flexeril for the spasms. Use flonase for the ear troubles. Gently try to pop your ears.  That should gradually get better.  Eustachian tube dysfunction.

## 2014-05-04 NOTE — Assessment & Plan Note (Signed)
Likely muscle strain. Stretch gently, ice and heat, flexeril with sedation caution.  F/u prn.  She agrees.

## 2014-05-04 NOTE — Assessment & Plan Note (Signed)
D/w pt.  Use flonase and this should resolved.  Gently try to pop ears.

## 2014-05-06 ENCOUNTER — Other Ambulatory Visit: Payer: Self-pay

## 2014-05-06 DIAGNOSIS — Z1231 Encounter for screening mammogram for malignant neoplasm of breast: Secondary | ICD-10-CM

## 2014-05-24 ENCOUNTER — Ambulatory Visit: Payer: Medicare Other

## 2014-06-23 ENCOUNTER — Telehealth: Payer: Self-pay | Admitting: Family Medicine

## 2014-06-23 DIAGNOSIS — H6980 Other specified disorders of Eustachian tube, unspecified ear: Secondary | ICD-10-CM

## 2014-06-23 NOTE — Telephone Encounter (Signed)
Pt called and request referral to [facility] for ongoing problems discussed at 05/04/14 visit. Please advise

## 2014-06-24 NOTE — Telephone Encounter (Signed)
Ordered. Thanks

## 2014-07-11 ENCOUNTER — Encounter: Payer: Self-pay | Admitting: Cardiology

## 2014-07-11 ENCOUNTER — Ambulatory Visit (INDEPENDENT_AMBULATORY_CARE_PROVIDER_SITE_OTHER): Payer: Medicare Other | Admitting: Cardiology

## 2014-07-11 VITALS — BP 122/90 | HR 80 | Ht 65.0 in | Wt 190.1 lb

## 2014-07-11 DIAGNOSIS — I517 Cardiomegaly: Secondary | ICD-10-CM

## 2014-07-11 NOTE — Patient Instructions (Signed)
Your physician recommends that you schedule a follow-up appointment in: 18 months with Dr. Hochrein  

## 2014-07-11 NOTE — Progress Notes (Signed)
HPI The patient presents for followup of mild ventricular hypertrophy. She's also had palpitations. An echo in 2010 suggested moderate mitral regurgitation directed posteriorly. There was LVH. This was severe. She is also had some aortic insufficiency. However followup echocardiography in 2013 suggested only mild mitral regurgitation and no significant ventricular hypertrophy or aortic insufficiency.  She presents for followup and says that she's done well.   However, she does describe palpitations. However, these are much less frequent than previous. .  The patient denies any new symptoms such as chest discomfort, neck or arm discomfort. There has been no new shortness of breath, PND or orthopnea.  She is taking medication for her hep C and is doing very well with this.  Allergies  Allergen Reactions  . Morphine Sulfate Swelling  . Penicillins Swelling  . Senna     Diarrhea, GI upset.     Current Outpatient Prescriptions  Medication Sig Dispense Refill  . HARVONI 90-400 MG TABS Take 1 tablet by mouth daily.  1  . verapamil (CALAN-SR) 240 MG CR tablet TAKE 1 TABLET BY MOUTH ONCE A DAY 90 tablet 1   No current facility-administered medications for this visit.    Past Medical History  Diagnosis Date  . Hypertrophic cardiomyopathy   . HCV (hepatitis C virus)   . GERD (gastroesophageal reflux disease)     occasional; OTC as needed  . Trigger finger of left hand 09/2012    left long finger    Past Surgical History  Procedure Laterality Date  . Oophorectomy Right 1980  . Myomectomy  10/09/99    x 2  . Partial hysterectomy  2002    with no remaining ovary  . Ovarian cyst drainage  1980  . Cholecystectomy  07/17/2011    Procedure: LAPAROSCOPIC CHOLECYSTECTOMY WITH INTRAOPERATIVE CHOLANGIOGRAM;  Surgeon: Haywood Lasso, MD;  Location: [facility];  Service: General;  Laterality: N/A;  . Oophorectomy Left 2000  . Breast reduction surgery Bilateral 2001  . Laceration repair Right 1996    thumb  . Foot ganglion excision Right   . Hemorrhoid surgery  1980  . Trigger finger release Left 09/28/2012    Procedure: RELEASE TRIGGER FINGER/A-1 PULLEY;  Surgeon: Jolyn Nap, MD;  Location: [facility];  Service: Orthopedics;  Laterality: Left;    ROS:  As stated in the HPI and negative for all other systems.  PHYSICAL EXAM BP 122/90 mmHg  Pulse 80  Ht 5\' 5"  (1.651 m)  Wt 190 lb 1.6 oz (86.229 kg)  BMI 31.63 kg/m2 GENERAL:  Well appearing NECK:  No jugular venous distention, waveform within normal limits, carotid upstroke brisk and symmetric, no bruits, no thyromegaly LYMPHATICS:  No cervical, inguinal adenopathy LUNGS:  Clear to auscultation bilaterally BACK:  No CVA tenderness CHEST:  Unremarkable HEART:  PMI not displaced or sustained,S1 and S2 within normal limits, no S3, no S4, no clicks, no rubs, no murmurs ABD:  Flat, positive bowel sounds normal in frequency in pitch, no bruits, no rebound, no guarding, no midline pulsatile mass, no hepatomegaly, no splenomegaly EXT:  2 plus pulses throughout, no edema, no cyanosis no clubbing  EKG:  Sinus rhythm, rate 80, axis within normal limits, intervals within normal limits, mild lateral T-wave inversions progress previous. 07/11/2014   ASSESSMENT AND PLAN  LVH:  This was mild on the last echo.  I do not suspect that further testing is indicated.  We will continue with BP control. Of note she does have some  changes on her EKG consistent with LVH and I gave her a copy of this.  HTN:  The blood pressure is at target. No change in medications is indicated. We will continue with therapeutic lifestyle changes (TLC).  PALPITATIONS:  These are less severe and less frequent.  No change in therapy is indicated.    MR:   I would have no reason to suspect this was worse than previous and will follow this clinically.

## 2014-07-14 ENCOUNTER — Ambulatory Visit (INDEPENDENT_AMBULATORY_CARE_PROVIDER_SITE_OTHER): Payer: Medicare Other | Admitting: Family Medicine

## 2014-07-14 ENCOUNTER — Encounter: Payer: Self-pay | Admitting: Family Medicine

## 2014-07-14 VITALS — BP 122/80 | HR 91 | Temp 98.5°F | Wt 189.8 lb

## 2014-07-14 DIAGNOSIS — H6981 Other specified disorders of Eustachian tube, right ear: Secondary | ICD-10-CM

## 2014-07-14 DIAGNOSIS — M545 Low back pain, unspecified: Secondary | ICD-10-CM

## 2014-07-14 MED ORDER — CYCLOBENZAPRINE HCL 10 MG PO TABS: 5.0000 mg | ORAL_TABLET | Freq: Three times a day (TID) | ORAL | Status: DC | PRN

## 2014-07-14 MED ORDER — FLUTICASONE PROPIONATE 50 MCG/ACT NA SUSP: 2.0000 | Freq: Every day | NASAL | Status: DC

## 2014-07-14 NOTE — Patient Instructions (Addendum)
Restart the flonase and use the muscle relaxer as needed.  Gently try to pop your ears.  Take care.  Glad to see you.  If you don't hear from [place] about setting up your CT in 09/2014, then call us.

## 2014-07-14 NOTE — Progress Notes (Signed)
Pre visit review using our clinic review tool, if applicable. No additional management support is needed unless otherwise documented below in the visit note.  She has had f/u with [facility] re: HCV and is on treatment.  Her viral load is undetectable.  I talked to her about that.    Prev with R ETD, treated with flonase and improved, then the sx came back, present now for about 3 weeks.   She can't pop her hears.  No sensation of ear vibration.  More sensitive to sound on R ear. No FCNAVD. No L sided sx.   She had some lower back pain, has seen chiropractor.  R lower back pain.  No sciatica.  Prev flexeril helped, needed a refill. Noted caring for her elderly mother.   Meds, vitals, and allergies reviewed.   ROS: See HPI.  Otherwise, noncontributory.  GEN: nad, alert and oriented HEENT: mucous membranes moist, tm w/o erythema, B ETD noted, nasal exam w/o erythema, clear discharge noted,  OP without cobblestoning NECK: supple w/o LA CV: rrr.   PULM: ctab, no inc wob

## 2014-07-15 NOTE — Assessment & Plan Note (Signed)
Restart prn flexeril.  F/u prn.  Continue stretching.

## 2014-07-15 NOTE — Assessment & Plan Note (Signed)
Restart flonase, gently valsalva, fu prn. She agrees.

## 2014-08-04 ENCOUNTER — Other Ambulatory Visit: Payer: Self-pay | Admitting: Family Medicine

## 2014-08-31 ENCOUNTER — Ambulatory Visit: Payer: Medicare Other | Admitting: Internal Medicine

## 2014-08-31 ENCOUNTER — Telehealth: Payer: Self-pay | Admitting: Family Medicine

## 2014-08-31 NOTE — Telephone Encounter (Signed)
error 

## 2014-09-05 ENCOUNTER — Telehealth: Payer: Self-pay | Admitting: *Deleted

## 2014-09-05 NOTE — Telephone Encounter (Signed)
I printed her a letter.  Please send.  Thanks.

## 2014-09-05 NOTE — Telephone Encounter (Signed)
Mailed

## 2014-09-05 NOTE — Telephone Encounter (Signed)
[facility] Call:  Patient cut her arm and was requesting when her last Td was given.  Patient was advised it was in 2006.  She states they gave her another one anyway because she had to have 3 stitches.  She has now moved to the [place] area so she is not sure if she will be continuing her care with [place] or establish closer to her home.

## 2014-09-05 NOTE — Telephone Encounter (Signed)
Letter amended.  Please send.  Thanks.

## 2014-09-06 ENCOUNTER — Other Ambulatory Visit: Payer: Self-pay | Admitting: Family Medicine

## 2014-09-06 DIAGNOSIS — R911 Solitary pulmonary nodule: Secondary | ICD-10-CM

## 2014-09-06 NOTE — Progress Notes (Signed)
Call pt.  Due for f/u CT chest.  I didn't want this to get dropped.  I went ahead and put in the follow up order.  FYI to patient- Rosaria Ferries will likely be in contact.  Thanks.

## 2014-10-09 ENCOUNTER — Telehealth: Payer: Self-pay | Admitting: Family Medicine

## 2014-10-09 NOTE — Telephone Encounter (Signed)
Please either call or send a letter to patient.  She was possibly going to est with a new clinic.  Either way, she is due for f/u CT chest to eval prev pulmonary nodules.  She is scheduled for CT on 11/10/14.  She can use the order I put in (ie she is going to f/u here) or she needs to get set up with new MD to address.  Thanks.

## 2014-10-10 NOTE — Telephone Encounter (Signed)
For now, patient is continuing her care here.

## 2014-11-10 ENCOUNTER — Ambulatory Visit (INDEPENDENT_AMBULATORY_CARE_PROVIDER_SITE_OTHER)
Admission: RE | Admit: 2014-11-10 | Discharge: 2014-11-10 | Disposition: A | Discharge status: Home / Self Care | Payer: Medicare Other | Source: Ambulatory Visit | Attending: Family Medicine | Admitting: Family Medicine

## 2014-11-10 ENCOUNTER — Ambulatory Visit
Admission: RE | Admit: 2014-11-10 | Discharge: 2014-11-10 | Disposition: A | Discharge status: Home / Self Care | Payer: Medicare Other | Source: Ambulatory Visit

## 2014-11-10 DIAGNOSIS — Z1231 Encounter for screening mammogram for malignant neoplasm of breast: Secondary | ICD-10-CM

## 2014-11-10 DIAGNOSIS — R911 Solitary pulmonary nodule: Secondary | ICD-10-CM

## 2014-11-11 ENCOUNTER — Other Ambulatory Visit: Payer: Self-pay | Admitting: Family Medicine

## 2014-11-11 DIAGNOSIS — R911 Solitary pulmonary nodule: Secondary | ICD-10-CM

## 2014-11-14 ENCOUNTER — Encounter: Payer: Self-pay | Admitting: *Deleted

## 2014-11-22 ENCOUNTER — Institutional Professional Consult (permissible substitution) (INDEPENDENT_AMBULATORY_CARE_PROVIDER_SITE_OTHER): Payer: Medicare Other | Admitting: Cardiothoracic Surgery

## 2014-11-22 ENCOUNTER — Encounter: Payer: Self-pay | Admitting: Cardiothoracic Surgery

## 2014-11-22 VITALS — BP 137/89 | HR 74 | Resp 16 | Ht 65.0 in | Wt 183.0 lb

## 2014-11-22 DIAGNOSIS — D381 Neoplasm of uncertain behavior of trachea, bronchus and lung: Secondary | ICD-10-CM

## 2014-11-22 NOTE — Progress Notes (Signed)
[address]       [place] [address]             [id number]                    Ilana J Kosanke [place] Medical Record #[id number] Date of Birth: [birth year]  Referring: Tonia Ghent, MD Primary Care: Elsie Stain, MD  Chief Complaint:    Chief Complaint  Patient presents with  . Lung Lesion    CT CHEST 11/10/14.Marland KitchenMarland KitchenLLLobe    History of Present Illness:    [name] 64 y.o. female is seen in the office  today for evaluation of a small, less than 1 cm left lung nodule. The patient originally because of abdominal complaints had a CT of the abdomen May 2014. This suggested a left lower lobe lung nodule. Follow-up CT scans showed this original nodule was not present on an additional area in the left lower lobe has been followed on scan September 20 09/18/2013 May 2016. The patient has been a former smoker, quit 10 years ago but prior to that smoked for close to 40 years, at least a 30 pack year history. She also has a family history of lung cancer.      Current Activity/ Functional Status:  Patient is independent with mobility/ambulation, transfers, ADL's, IADL's.   Zubrod Score: At the time of surgery this patient's most appropriate activity status/level should be described as: [x]     0    Normal activity, no symptoms []     1    Restricted in physical strenuous activity but ambulatory, able to do out light work []     2    Ambulatory and capable of self care, unable to do work activities, up and about               >50 % of waking hours                              []     3    Only limited self care, in bed greater than 50% of waking hours []     4    Completely disabled, no self care, confined to bed or chair []     5    Moribund   Past Medical History  Diagnosis Date  . Hypertrophic cardiomyopathy   . HCV (hepatitis C virus)   . GERD (gastroesophageal reflux disease)     occasional; OTC as needed  . Trigger finger of left hand 09/2012   left long finger   History of hepatitis C, has just completed 3 months of Harvoni  Past Surgical History  Procedure Laterality Date  . Oophorectomy Right 1980  . Myomectomy  10/09/99    x 2  . Partial hysterectomy  2002    with no remaining ovary  . Ovarian cyst drainage  1980  . Cholecystectomy  07/17/2011    Procedure: LAPAROSCOPIC CHOLECYSTECTOMY WITH INTRAOPERATIVE CHOLANGIOGRAM;  Surgeon: Haywood Lasso, MD;  Location: [facility];  Service: General;  Laterality: N/A;  . Oophorectomy Left 2000  . Breast reduction surgery Bilateral 2001  . Laceration repair Right 1996    thumb  . Foot ganglion excision Right   . Hemorrhoid surgery  1980  . Trigger finger release Left 09/28/2012    Procedure: RELEASE TRIGGER FINGER/A-1 PULLEY;  Surgeon: Jolyn Nap, MD;  Location: [facility];  Service: Orthopedics;  Laterality: Left;    Family History  Problem Relation Age of Onset  . Colon cancer Father   . Hepatitis C Brother   . Colon cancer      Aunt  . Diabetes Sister   . Hypertension Brother   . Hepatitis C Brother   . Colon cancer Paternal Aunt   . Lung cancer Paternal Uncle   . Breast cancer Cousin   . Liver cancer Cousin    PATIENT NOTES STRONG FAMILY HISTORY OF LUNG CANCER FOR ALL PULSE OF DIED OF LUNG CANCER IN ONE AUNT ALL 100 MOTHER SIDE SHE HAS 2 BROTHERS AND ONE SISTER ARE HEALTHY  History   Social History  . Marital Status: Single    Spouse Name: N/A  . Number of Children: 2  . Years of Education: N/A   Occupational History  . [employer] from home    Social History Main Topics  . Smoking status: Former Smoker -- 2.50 packs/day for 40 years    Types: Cigarettes    Quit date: 07/07/2005  . Smokeless tobacco: Never Used     Comment: quit smoking 2008  . Alcohol Use: No  . Drug Use: No     Comment: previous hx. of  . Sexual Activity: Yes    Birth Control/ Protection: Surgical    Social History Narrative   Divorced 1982   2 children (16 months  apart)    History  Smoking status  . Former Smoker -- 2.50 packs/day for 40 years  . Types: Cigarettes  . Quit date: 07/07/2005  Smokeless tobacco  . Never Used    Comment: quit smoking 2008    History  Alcohol Use No     Allergies  Allergen Reactions  . Morphine Sulfate Swelling  . Penicillins Swelling    Current Outpatient Prescriptions  Medication Sig Dispense Refill  . cyclobenzaprine (FLEXERIL) 10 MG tablet Take 0.5-1 tablets (5-10 mg total) by mouth 3 (three) times daily as needed for muscle spasms (sedation caution). 30 tablet 1  . verapamil (CALAN-SR) 240 MG CR tablet TAKE 1 TABLET BY MOUTH ONCE A DAY 90 tablet 1   No current facility-administered medications for this visit.      Review of Systems:     Cardiac Review of Systems: Y or N  Chest Pain [  N  ]  Resting SOB [ N  ] Exertional SOB  [ Y ]  Orthopnea [N]   Pedal Edema Aqua.Slicker   ]    Palpitations [ N ] Syncope  [ N ]   Presyncope [  N ]  General Review of Systems: [Y] = yes [  ]=no Constitional: recent weight change Aqua.Slicker  ];  Wt loss over the last 3 months [   ] anorexia [ N ]; fatigue [  N]; nausea Aqua.Slicker  ]; night sweats Aqua.Slicker ]; fever [ N ]; or chills [  ];          Dental: poor dentition[  ]; Last Dentist visit:   Eye : blurred vision [  ]; diplopia [   ]; vision changes [  ];  Amaurosis fugax[  ]; Resp: cough Aqua.Slicker  ];  wheezing[ N ];  hemoptysis[N  ]; shortness of breath[ N ]; paroxysmal nocturnal dyspnea[  ]; dyspnea on exertion[ MILD ]; or orthopnea[  ];  GI:  gallstones[  ], vomiting[  ];  dysphagia[  ]; melena[  ];  hematochezia [  ]; heartburn[  ];  Hx of  Colonoscopy[RECENTLY ]; HX OF hEP c GU: kidney stones [  ]; hematuria[N  ];   dysuria [  ];  nocturia[  ];  history of     obstruction [  ]; urinary frequency [  N]             Skin: rash, swelling[  ];, hair loss[  ];  peripheral edema[  ];  or itching[  ]; Musculosketetal: myalgias[  ];  joint swelling[  ];  joint erythema[  ];  joint pain[  ];  back pain[   ];  Heme/Lymph: bruising[  ];  bleeding[  ];  anemia[  ];  Neuro: TIA[  ];  headaches[  ];  stroke[ N ];  vertigo[  ];  seizures[ N ];   paresthesias[  ];  difficulty walking[ N ];  Psych:depression[ N ]; anxiety[N  ];  Endocrine: diabetes[ N ];  thyroid dysfunction[  ];  Immunizations: Flu up to date [ Y ]; Pneumococcal up to date [ Y ];  Other:  Physical Exam: BP 137/89 mmHg  Pulse 74  Resp 16  Ht 5\' 5"  (1.651 m)  Wt 183 lb (83.008 kg)  BMI 30.45 kg/m2  SpO2 98%  PHYSICAL EXAMINATION: General appearance: alert, cooperative, appears stated age and no distress Head: Normocephalic, without obvious abnormality, atraumatic Neck: no adenopathy, no carotid bruit, no JVD, supple, symmetrical, trachea midline and thyroid not enlarged, symmetric, no tenderness/mass/nodules Lymph nodes: Cervical, supraclavicular, and axillary nodes normal. Resp: clear to auscultation bilaterally Back: symmetric, no curvature. ROM normal. No CVA tenderness. Cardio: regular rate and rhythm, S1, S2 normal, no murmur, click, rub or gallop, no click and no rub GI: soft, non-tender; bowel sounds normal; no masses,  no organomegaly Extremities: extremities normal, atraumatic, no cyanosis or edema and Homans sign is negative, no sign of DVT No carotid bruits, full DP and PT pulses bilaterally  Diagnostic Studies & Laboratory data:     Recent Radiology Findings:   Ct Chest Wo Contrast  11/10/2014   CLINICAL DATA:  Pulmonary nodule follow-up  EXAM: CT CHEST WITHOUT CONTRAST  TECHNIQUE: Multidetector CT imaging of the chest was performed following the standard protocol without IV contrast.  COMPARISON:  10/04/2013  FINDINGS: The central airways are patent. There is a 10 mm irregular ground-glass nodule in the left lower lobe image 45/series 3 which previously measured 7 mm. There is no pulmonary mass. There is no focal consolidation. There is no pleural effusion or pneumothorax.  There are no pathologically enlarged  axillary, hilar or mediastinal lymph nodes.  The heart size is normal. There is no pericardial effusion. The thoracic aorta is normal in caliber.  Review of bone windows demonstrates no focal lytic or sclerotic lesions.  Limited non-contrast images of the upper abdomen were obtained. The adrenal glands appear normal. The remainder of the visualized abdominal organs are unremarkable.  IMPRESSION: 1. Irregular 10 mm ground-glass nodule in the left lower lobe which is more conspicuous compared with the prior examination. The size of is slightly increased on the axial images, but on the coronal images appears stable. Adenocarcinoma cannot be excluded. Thoracic surgery consultation is recommended. Follow up by CT is recommended in 12 months, with continued annual surveillance for a minimum of 3 years. These recommendations are taken from: Recommendations for the Management of Subsolid Pulmonary Nodules Detected at CT: A Statement from the [facility] 2013; 266:1, [phone].   Electronically Signed   By: Kathreen Devoid   On:  11/10/2014 16:03   I have independently reviewed the above radiology studies  and reviewed the findings with the patient.    Mm Digital Screening Bilateral  11/11/2014   CLINICAL DATA:  Screening. Prior bilateral reduction mammoplasties in 2001.  EXAM: DIGITAL SCREENING BILATERAL MAMMOGRAM WITH CAD  COMPARISON:  Previous exam(s).  ACR Breast Density Category b: There are scattered areas of fibroglandular density.  FINDINGS: There are no findings suspicious for malignancy. Expected post surgical changes in both breasts related to the prior reduction surgery. Images were processed with CAD.  IMPRESSION: No mammographic evidence of malignancy. A result letter of this screening mammogram will be mailed directly to the patient.  RECOMMENDATION: Screening mammogram in one year. (Code:SM-B-01Y)  BI-RADS CATEGORY  2: Benign.   Electronically Signed   By: Evangeline Dakin M.D.   On:  11/11/2014 16:52     I have independently reviewed the above radiologic studies.  Recent Lab Findings: Lab Results  Component Value Date   WBC 7.3 11/18/2013   HGB 13.1 11/18/2013   HCT 39.7 11/18/2013   PLT 202.0 11/18/2013   GLUCOSE 101* 03/10/2013   CHOL 152 03/10/2013   TRIG 108.0 03/10/2013   HDL 42.20 03/10/2013   LDLCALC 88 03/10/2013   ALT 52* 03/10/2013   AST 51* 03/10/2013   NA 138 03/10/2013   K 3.9 03/10/2013   CL 104 03/10/2013   CREATININE 0.6 03/10/2013   BUN 7 03/10/2013   CO2 31 03/10/2013   TSH 0.91 04/21/2008   INR 0.98 12/26/2011      Assessment / Plan:   Patient with small left lung nodule present on CT scan since 2014, followed with CT with very slight change but with a long smoking history and positive family history of carcinoma the lung. With the patient's increased risk for lung cancer I recommended to her that we perform a repeat CT scan in 6 months rather than one year because of the approximate 8 mm left lung nodule. The scans have been reviewed with her and she is agreeable with this approach.      I  spent 40 minutes counseling the patient face to face and 50% or more the  time was spent in counseling and coordination of care. The total time spent in the appointment was 60 minutes.  Grace Isaac MD      [address] [place] [address] Office [phone]   Beeper [phone]  11/22/2014 12:09 PM

## 2014-11-28 ENCOUNTER — Ambulatory Visit: Payer: Medicare Other | Admitting: Family Medicine

## 2014-12-08 ENCOUNTER — Encounter: Payer: Self-pay | Admitting: Family Medicine

## 2014-12-08 ENCOUNTER — Ambulatory Visit (INDEPENDENT_AMBULATORY_CARE_PROVIDER_SITE_OTHER): Payer: Medicare Other | Admitting: Family Medicine

## 2014-12-08 VITALS — BP 124/76 | HR 91 | Temp 98.4°F | Wt 184.2 lb

## 2014-12-08 DIAGNOSIS — M25512 Pain in left shoulder: Secondary | ICD-10-CM

## 2014-12-08 DIAGNOSIS — T148 Other injury of unspecified body region: Secondary | ICD-10-CM

## 2014-12-08 DIAGNOSIS — T148XXA Other injury of unspecified body region, initial encounter: Secondary | ICD-10-CM

## 2014-12-08 NOTE — Progress Notes (Signed)
Pre visit review using our clinic review tool, if applicable. No additional management support is needed unless otherwise documented below in the visit note.  New pillow.  Had been working out with weights also.  Then had L neck and shoulder pain.  Pain better with laying on L side, worse laying on R side, "like it is pulling."  Dull ache.  Pain with certain movements, reaching out and to the side.  Not as much pain overhead.   Has used flexeril for this pain w/o much relief.    Meds, vitals, and allergies reviewed.   ROS: See HPI.  Otherwise, noncontributory.  nad ncat Neck supple but L trap ttp No midline pain Normal ROM L shoulder, normal int/ext rotation, no pain on cuff or AC testing. NV intact No arm drop rrr ctab

## 2014-12-08 NOTE — Patient Instructions (Signed)
Ice, aleve with food (max 2 tabs twice a day). This should get better.  No reason to suspect rotator cuff problems.  Take care.  Glad to see you.

## 2014-12-09 DIAGNOSIS — T148XXA Other injury of unspecified body region, initial encounter: Secondary | ICD-10-CM | POA: Insufficient documentation

## 2014-12-09 NOTE — Assessment & Plan Note (Signed)
Likely trap spasm/strain, ice and aleve with routine cautions. F/u prn.

## 2015-01-02 ENCOUNTER — Other Ambulatory Visit: Payer: Self-pay

## 2015-02-02 ENCOUNTER — Ambulatory Visit: Payer: Medicare Other | Admitting: Family Medicine

## 2015-02-09 ENCOUNTER — Other Ambulatory Visit: Payer: Self-pay | Admitting: Family Medicine

## 2015-02-21 ENCOUNTER — Other Ambulatory Visit: Payer: Self-pay | Admitting: Family Medicine

## 2015-03-22 ENCOUNTER — Ambulatory Visit: Payer: Medicare Other | Admitting: Family Medicine

## 2015-03-22 ENCOUNTER — Telehealth: Payer: Self-pay | Admitting: Internal Medicine

## 2015-03-22 NOTE — Telephone Encounter (Signed)
Patient is having problems with abdominal pain after eating. Offered OV with extender but she only wants to see MD. Scheduled with Dr. Havery Moros on 04/03/15.

## 2015-04-03 ENCOUNTER — Encounter: Payer: Self-pay | Admitting: Gastroenterology

## 2015-04-03 ENCOUNTER — Other Ambulatory Visit: Payer: Self-pay

## 2015-04-03 ENCOUNTER — Telehealth: Payer: Self-pay

## 2015-04-03 ENCOUNTER — Ambulatory Visit (INDEPENDENT_AMBULATORY_CARE_PROVIDER_SITE_OTHER): Payer: Medicare Other | Admitting: Gastroenterology

## 2015-04-03 ENCOUNTER — Other Ambulatory Visit (INDEPENDENT_AMBULATORY_CARE_PROVIDER_SITE_OTHER): Payer: Medicare Other

## 2015-04-03 VITALS — BP 138/86 | HR 72 | Ht 65.35 in | Wt 189.4 lb

## 2015-04-03 DIAGNOSIS — R933 Abnormal findings on diagnostic imaging of other parts of digestive tract: Secondary | ICD-10-CM | POA: Diagnosis not present

## 2015-04-03 DIAGNOSIS — R109 Unspecified abdominal pain: Secondary | ICD-10-CM

## 2015-04-03 DIAGNOSIS — Z8619 Personal history of other infectious and parasitic diseases: Secondary | ICD-10-CM | POA: Diagnosis not present

## 2015-04-03 LAB — COMPREHENSIVE METABOLIC PANEL
ALT: 18 U/L (ref 0–35)
AST: 18 U/L (ref 0–37)
Albumin: 4.5 g/dL (ref 3.5–5.2)
Alkaline Phosphatase: 83 U/L (ref 39–117)
BILIRUBIN TOTAL: 0.4 mg/dL (ref 0.2–1.2)
BUN: 9 mg/dL (ref 6–23)
CO2: 31 meq/L (ref 19–32)
CREATININE: 0.64 mg/dL (ref 0.40–1.20)
Calcium: 10.3 mg/dL (ref 8.4–10.5)
Chloride: 102 mEq/L (ref 96–112)
GFR: 119.38 mL/min (ref >60.00)
Glucose, Bld: 96 mg/dL (ref 70–99)
Potassium: 4.2 mEq/L (ref 3.5–5.1)
Sodium: 139 mEq/L (ref 135–145)
Total Protein: 8.4 g/dL — ABNORMAL HIGH (ref 6.0–8.3)

## 2015-04-03 MED ORDER — NA SULFATE-K SULFATE-MG SULF 17.5-3.13-1.6 GM/177ML PO SOLN: ORAL | Status: DC

## 2015-04-03 NOTE — Telephone Encounter (Signed)
Called pt to inform her to pick up contrast at frint desk. Left vm for pt to call back

## 2015-04-03 NOTE — Patient Instructions (Signed)
Your physician has requested that you go to the basement for the following lab work before leaving today: CMET  You have been scheduled for an endoscopy and colonoscopy. Please follow the written instructions given to you at your visit today. Please pick up your prep supplies at the pharmacy within the next 1-3 days. If you use inhalers (even only as needed), please bring them with you on the day of your procedure. Your physician has requested that you go to www.startemmi.com and enter the access code given to you at your visit today. This web site gives a general overview about your procedure. However, you should still follow specific instructions given to you by our office regarding your preparation for the procedure.   You have been scheduled for a CT scan of the abdomen and pelvis at [facility] ([address]---this is in the same building as Press [facility]).   You are scheduled on 04/07/2015 at 1:00pm. You should arrive 15 minutes prior to your appointment time for registration. Please follow the written instructions below on the day of your exam:  WARNING: IF YOU ARE ALLERGIC TO IODINE/X-RAY DYE, PLEASE NOTIFY RADIOLOGY IMMEDIATELY AT [phone]! YOU WILL BE GIVEN A 13 HOUR PREMEDICATION PREP.  1) Do not eat or drink anything after 9:00am (4 hours prior to your test) 2) You have been given 2 bottles of oral contrast to drink. The solution may taste               better if refrigerated, but do NOT add ice or any other liquid to this solution. Shake             well before drinking.    Drink 1 bottle of contrast @ 11:00am (2 hours prior to your exam)  Drink 1 bottle of contrast @ 12:00pm (1 hour prior to your exam)  You may take any medications as prescribed with a small amount of water except for the following: Metformin, Glucophage, Glucovance, Avandamet, Riomet, Fortamet, Actoplus Met, Janumet, Glumetza or Metaglip. The above medications must be held the day of the  exam AND 48 hours after the exam.  The purpose of you drinking the oral contrast is to aid in the visualization of your intestinal tract. The contrast solution may cause some diarrhea. Before your exam is started, you will be given a small amount of fluid to drink. Depending on your individual set of symptoms, you may also receive an intravenous injection of x-ray contrast/dye. Plan on being at [facility] for 30 minutes or long, depending on the type of exam you are having performed.  This test typically takes 30-45 minutes to complete.  If you have any questions regarding your exam or if you need to reschedule, you may call the CT department at [phone] between the hours of 8:00 am and 5:00 pm, Monday-Friday.  ________________________________________________________________________

## 2015-04-03 NOTE — Progress Notes (Signed)
HPI :  64 y/o female former patient of Dr. Olevia Perches last seen in 2015, here for follow up today for abdominal pains.  She reports she has had symptoms in her upper abdomen ongoing for several years, since at least 2006 or so. She describes a discomfort located in her upper abdomen in a band distribution. She thinks it is present most every morning, she thinks it feels like "something is inside me shaking". It previously woke her from sleep however has not done this recently. She thinks it usually resolves after she is up for a bit. She denies any relation to eating. No nausea or vomiting. No changes in relation to her bowel movements. She is having 2 BMs per day or so. Takes metamucil. No blood in the stools at all. She thought her senna may have caused her symptoms previously but stopped it and the symptoms have persisted. She is not sure eating anything in particular can cause her symptoms.  She has a reported history of Hepatitis C without cirrhosis s/p Harvoni treatment and a lot of her symptoms resolved for about a month. She was treated with Harvoni last November 2015 and HCV eradicated, however her symptoms came back within weeks of finishing therapy. She also has had her GB Removed, 3-4 years ago which provided some sligtt improvement for about a month and then symptoms came back again. She had a CT scan in 2014 showing a prominent lymph node near the liver but no mass lesion.   Her father had colon cancer, passed away from it, diagnosed around age 73s or so. She has been having colonoscopy every 5 years for this issue. She had a colonoscopy January 2012 which was a normal exam. No prior polyps.   She has had one EGD done a very long time [place], no records of it and her symptoms long after this EGD was completed.  She has only rare heartburn at nights and symptoms do not feel like heartburn. No dysphagia. No chest pains or shortness of breath. Followed by cardiology for history of  cardiomyopathy.   Past Medical History  Diagnosis Date  . Hypertrophic cardiomyopathy   . HCV (hepatitis C virus)     treated and cured 2016  . GERD (gastroesophageal reflux disease)     occasional; OTC as needed  . Trigger finger of left hand 09/2012    left long finger  . Palpitations      Past Surgical History  Procedure Laterality Date  . Oophorectomy Right 1980  . Myomectomy  10/09/99    x 2  . Partial hysterectomy  2002    with no remaining ovary  . Ovarian cyst drainage  1980  . Cholecystectomy  07/17/2011    Procedure: LAPAROSCOPIC CHOLECYSTECTOMY WITH INTRAOPERATIVE CHOLANGIOGRAM;  Surgeon: Haywood Lasso, MD;  Location: [facility];  Service: General;  Laterality: N/A;  . Oophorectomy Left 2000  . Breast reduction surgery Bilateral 2001  . Laceration repair Right 1996    thumb  . Foot ganglion excision Right   . Hemorrhoid surgery  1980  . Trigger finger release Left 09/28/2012    Procedure: RELEASE TRIGGER FINGER/A-1 PULLEY;  Surgeon: Jolyn Nap, MD;  Location: [facility];  Service: Orthopedics;  Laterality: Left;   Family History  Problem Relation Age of Onset  . Colon cancer Father   . Hepatitis C Brother   . Colon cancer      Aunt  . Diabetes Sister   .  Hypertension Brother   . Hepatitis C Brother   . Colon cancer Paternal Aunt   . Lung cancer Paternal Uncle   . Breast cancer Cousin   . Liver cancer Cousin    Social History  Substance Use Topics  . Smoking status: Former Smoker -- 2.50 packs/day for 40 years    Types: Cigarettes    Quit date: 07/07/2005  . Smokeless tobacco: Never Used     Comment: quit smoking 2008  . Alcohol Use: No   Current Outpatient Prescriptions  Medication Sig Dispense Refill  . verapamil (CALAN-SR) 240 MG CR tablet TAKE 1 TABLET BY MOUTH ONCE A DAY 90 tablet 1  . cyclobenzaprine (FLEXERIL) 10 MG tablet Take 0.5-1 tablets (5-10 mg total) by mouth 3 (three) times daily as needed for muscle spasms (sedation  caution). (Patient not taking: Reported on 04/03/2015) 30 tablet 1   No current facility-administered medications for this visit.   Allergies  Allergen Reactions  . Morphine Sulfate Swelling  . Penicillins Swelling     Review of Systems: All systems reviewed and negative except where noted in HPI.    CT abdomen 2014 - IMPRESSION: Stable hepatoduodenal lymph node prominence. In the absence of any known malignancy, this is unlikely to be a clinically significant finding and clinical follow-up is recommended.  CBC / ESR normal May 2015  Physical Exam: BP 138/86 mmHg  Pulse 72  Ht 5' 5.35" (1.66 m)  Wt 189 lb 6 oz (85.9 kg)  BMI 31.17 kg/m2 Constitutional: Pleasant,well-developed, AA female in no acute distress. HEENT: Normocephalic and atraumatic. Conjunctivae are normal. No scleral icterus. Neck supple.  Cardiovascular: Normal rate, regular rhythm.  Pulmonary/chest: Effort normal and breath sounds normal. No wheezing, rales or rhonchi. Abdominal: Soft, nondistended, nontender. Bowel sounds active throughout. There are no masses palpable. No hepatomegaly. Extremities: no edema Lymphadenopathy: No cervical adenopathy noted. Neurological: Alert and oriented to person place and time. Skin: Skin is warm and dry. No rashes noted. Psychiatric: Normal mood and affect. Behavior is normal.   ASSESSMENT AND PLAN: 64 y/o female with history of HCV s/p eradication with Harvoni in 2015, no reported known history of cirrhosis, presenting with chronic upper abdominal discomfort of unclear etiology, as described above. It bothers her most every morning and eventually improves, but can be variable. Prior imaging reviewed, she has a prominent hepatic lymph node noted on CT in 2014 of unclear etiology. While her HCV has been reportedly eradicated and no history of cirrhosis the patient is aware of, she is at risk for [facility] and given CT finding in 2014 will recommend repeat imaging to ensure no  interval changes in this light. Will also repeat CMP to ensure LFTs stable and unchanged. Otherwise, symptoms appear longstanding and stable. She has not had an EGD since her symptoms began and offered her one to ensure no H pylori or mucosal abnormalities which could be contributing or causing this symptom. Otherwise, she has a strong FH of colon cancer and is due for screening colonoscopy in January 2017. I offered to perform her screening exam at the same time of her EGD to avoid having to sedate her multiple times, and she was agreeable to this.  The indications, risks, and benefits of EGD and colonoscopy were explained to the patient in detail. Risks include but are not limited to bleeding, perforation, adverse reaction to medications, and cardiopulmonary compromise. Sequelae include but are not limited to the possibility of surgery, hositalization, and mortality. The patient verbalized understanding and  wished to proceed. All questions answered, referred to scheduler and bowel prep ordered. Further recommendations pending results of the exam.   Doon Cellar, MD [facility] Gastroenterology Pager [phone]

## 2015-04-07 ENCOUNTER — Inpatient Hospital Stay: Admission: RE | Admit: 2015-04-07 | Payer: Medicare Other | Source: Ambulatory Visit

## 2015-04-10 ENCOUNTER — Telehealth: Payer: Self-pay | Admitting: Gastroenterology

## 2015-04-10 ENCOUNTER — Other Ambulatory Visit: Payer: Self-pay

## 2015-04-10 DIAGNOSIS — Z1211 Encounter for screening for malignant neoplasm of colon: Secondary | ICD-10-CM

## 2015-04-10 DIAGNOSIS — R109 Unspecified abdominal pain: Secondary | ICD-10-CM

## 2015-04-10 DIAGNOSIS — Z8619 Personal history of other infectious and parasitic diseases: Secondary | ICD-10-CM

## 2015-04-10 NOTE — Telephone Encounter (Signed)
Left vm for pt to call me back. 

## 2015-04-11 NOTE — Telephone Encounter (Signed)
Called pt and informed her that I will leave suprep sample at the front. Pt understands and states she will be here this week to pick it up.

## 2015-05-02 ENCOUNTER — Inpatient Hospital Stay: Admission: RE | Admit: 2015-05-02 | Payer: Medicare Other | Source: Ambulatory Visit

## 2015-05-02 ENCOUNTER — Ambulatory Visit: Payer: Medicare Other | Admitting: Cardiology

## 2015-05-04 ENCOUNTER — Encounter: Payer: Self-pay | Admitting: Gastroenterology

## 2015-05-04 ENCOUNTER — Ambulatory Visit (AMBULATORY_SURGERY_CENTER): Payer: Medicare Other | Admitting: Gastroenterology

## 2015-05-04 ENCOUNTER — Other Ambulatory Visit: Payer: Self-pay | Admitting: Cardiothoracic Surgery

## 2015-05-04 VITALS — BP 155/106 | HR 76 | Temp 98.1°F | Resp 15 | Ht 65.0 in | Wt 189.0 lb

## 2015-05-04 DIAGNOSIS — Z1211 Encounter for screening for malignant neoplasm of colon: Secondary | ICD-10-CM

## 2015-05-04 DIAGNOSIS — D381 Neoplasm of uncertain behavior of trachea, bronchus and lung: Secondary | ICD-10-CM

## 2015-05-04 DIAGNOSIS — Z800 Family history of malignant neoplasm of digestive organs: Secondary | ICD-10-CM | POA: Diagnosis not present

## 2015-05-04 DIAGNOSIS — R109 Unspecified abdominal pain: Secondary | ICD-10-CM

## 2015-05-04 DIAGNOSIS — D128 Benign neoplasm of rectum: Secondary | ICD-10-CM

## 2015-05-04 DIAGNOSIS — K621 Rectal polyp: Secondary | ICD-10-CM | POA: Diagnosis not present

## 2015-05-04 MED ORDER — SODIUM CHLORIDE 0.9 % IV SOLN: 500.0000 mL | INTRAVENOUS | Status: DC

## 2015-05-04 NOTE — Progress Notes (Signed)
Called to room to assist during endoscopic procedure.  Patient ID and intended procedure confirmed with present staff. Received instructions for my participation in the procedure from the performing physician.  

## 2015-05-04 NOTE — Progress Notes (Signed)
Patient with multiple complaints, lots of secretions, tongue being numb, bump on lip. Do not see any physical trauma, expressed that bite block may have pinched lip a bit. Patient in agreement.

## 2015-05-04 NOTE — Patient Instructions (Addendum)
Impressions/recommendations:  Polyps (handout given) Diverticulosis (handout given)  Normal upper endoscopy.  Await pathology results.    YOU HAD AN ENDOSCOPIC PROCEDURE TODAY AT [facility]:   Refer to the procedure report that was given to you for any specific questions about what was found during the examination.  If the procedure report does not answer your questions, please call your gastroenterologist to clarify.  If you requested that your care partner not be given the details of your procedure findings, then the procedure report has been included in a sealed envelope for you to review at your convenience later.  YOU SHOULD EXPECT: Some feelings of bloating in the abdomen. Passage of more gas than usual.  Walking can help get rid of the air that was put into your GI tract during the procedure and reduce the bloating. If you had a lower endoscopy (such as a colonoscopy or flexible sigmoidoscopy) you may notice spotting of blood in your stool or on the toilet paper. If you underwent a bowel prep for your procedure, you may not have a normal bowel movement for a few days.  Please Note:  You might notice some irritation and congestion in your nose or some drainage.  This is from the oxygen used during your procedure.  There is no need for concern and it should clear up in a day or so.  SYMPTOMS TO REPORT IMMEDIATELY:   Following lower endoscopy (colonoscopy or flexible sigmoidoscopy):  Excessive amounts of blood in the stool  Significant tenderness or worsening of abdominal pains  Swelling of the abdomen that is new, acute  Fever of 100F or higher   Following upper endoscopy (EGD)  Vomiting of blood or coffee ground material  New chest pain or pain under the shoulder blades  Painful or persistently difficult swallowing  New shortness of breath  Fever of 100F or higher  Black, tarry-looking stools  For urgent or emergent issues, a gastroenterologist can be  reached at any hour by calling [phone].   DIET: Your first meal following the procedure should be a small meal and then it is ok to progress to your normal diet. Heavy or fried foods are harder to digest and may make you feel nauseous or bloated.  Likewise, meals heavy in dairy and vegetables can increase bloating.  Drink plenty of fluids but you should avoid alcoholic beverages for 24 hours.  ACTIVITY:  You should plan to take it easy for the rest of today and you should NOT DRIVE or use heavy machinery until tomorrow (because of the sedation medicines used during the test).    FOLLOW UP: Our staff will call the number listed on your records the next business day following your procedure to check on you and address any questions or concerns that you may have regarding the information given to you following your procedure. If we do not reach you, we will leave a message.  However, if you are feeling well and you are not experiencing any problems, there is no need to return our call.  We will assume that you have returned to your regular daily activities without incident.  If any biopsies were taken you will be contacted by phone or by letter within the next 1-3 weeks.  Please call us at [phone] if you have not heard about the biopsies in 3 weeks.    SIGNATURES/CONFIDENTIALITY: You and/or your care partner have signed paperwork which will be entered into your electronic medical record.  These signatures attest to the fact that that the information above on your After Visit Summary has been reviewed and is understood.  Full responsibility of the confidentiality of this discharge information lies with you and/or your care-partner.

## 2015-05-04 NOTE — Op Note (Signed)
[place]  [address] [place], [id number]   COLONOSCOPY PROCEDURE REPORT  PATIENT: Patricia, Prince  MR#: [id number] BIRTHDATE: July 23, 1950 , 12  yrs. old GENDER: female ENDOSCOPIST: Yetta Flock, MD REFERRED BY: PROCEDURE DATE:  05/04/2015 PROCEDURE:   Colonoscopy, screening and Colonoscopy with snare polypectomy First Screening Colonoscopy - Avg.  risk and is 50 yrs.  old or older - No.  Prior Negative Screening - Now for repeat screening. Above average risk  History of Adenoma - Now for follow-up colonoscopy & has been > or = to 3 yrs.  N/A  Polyps removed today? Yes ASA CLASS:   Class II INDICATIONS:Screening for colonic neoplasia and FH Colon or Rectal Adenocarcinoma. MEDICATIONS: Propofol 200 mg IV  DESCRIPTION OF PROCEDURE:   After the risks benefits and alternatives of the procedure were thoroughly explained, informed consent was obtained.  The digital rectal exam revealed no abnormalities of the rectum.   The LB [id number]  endoscope was introduced through the anus and advanced to the cecum, which was identified by both the appendix and ileocecal valve. No adverse events experienced.   The quality of the prep was adequate  The instrument was then slowly withdrawn as the colon was fully examined. Estimated blood loss is zero unless otherwise noted in this procedure report.  COLON FINDINGS: Two sessile polyps measuring 4 mm in size were found in the rectum.  Polypectomies were performed with a cold snare. The resection was complete, the polyp tissue was partially retrieved (both suctioned but only one noted in the trap) and sent to histology.   There was mild diverticulosis noted in the sigmoid colon and ascending colon.   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   The examination was otherwise normal. Patient did not retain air leading to prolonged withdrawal. wellRetroflexed views revealed internal  hemorrhoids. The time to cecum = 3.3 Withdrawal time = 23.8   The scope was withdrawn and the procedure completed. COMPLICATIONS: There were no immediate complications.  ENDOSCOPIC IMPRESSION: 1.   Two sessile polyps were found in the rectum; polypectomies were performed with a cold snare 2.   Mild diverticulosis was noted in the sigmoid colon and ascending colon 3.   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   The examination was otherwise normal RECOMMENDATIONS: 1.  Await pathology results 2.  Resume diet 3.  Resume medications  eSigned:  Yetta Flock, MD 05/04/2015 3:40 PM   cc: the patient   PATIENT NAME:  Patricia, Prince MR#: [id number]

## 2015-05-04 NOTE — Op Note (Signed)
[place]  [address]. [place], [id number]   ENDOSCOPY PROCEDURE REPORT  PATIENT: Patricia, Prince  MR#: [id number] BIRTHDATE: 08-09-1950 , 63  yrs. old GENDER: female ENDOSCOPIST: Yetta Flock, MD REFERRED BY: PROCEDURE DATE:  05/04/2015 PROCEDURE:  EGD w/ biopsy ASA CLASS:     Class II INDICATIONS:  epigastric pain. MEDICATIONS: Propofol 200 mg IV TOPICAL ANESTHETIC:  DESCRIPTION OF PROCEDURE: After the risks benefits and alternatives of the procedure were thoroughly explained, informed consent was obtained.  The LB [id number] endoscope was introduced through the mouth and advanced to the second portion of the duodenum , Without limitations.  The instrument was slowly withdrawn as the mucosa was fully examined.  FINDINGS:The esophagus was normal.  No esophageal varices noted. DH, GEJ, and SCJ noted at 38cm from the incisors.  The examined stomach was normal without inflammatory changes or ulcerations. Biopsies taken to rule out H pylori.  The examined duodenal bulb and 2nd portion of the dudodenum were normal.  Biopsies taken to ensure normal and rule out celiac.  Retroflexed views revealed no abnormalities.     The scope was then withdrawn from the patient and the procedure completed. COMPLICATIONS: There were no immediate complications.  ENDOSCOPIC IMPRESSION: Normal esophagus Normal stomach, biopsies obtained Normal duodenum, biopsies obtained Overall no clear etiology for abdominal pain noted on this exam, will await biopsy results.  RECOMMENDATIONS: Await pathology results Resume diet Resume medications  eSigned:  Yetta Flock, MD 05/04/2015 3:44 PM    CC:the patient  PATIENT NAME:  Patricia, Prince MR#: [id number]

## 2015-05-05 ENCOUNTER — Telehealth: Payer: Self-pay | Admitting: *Deleted

## 2015-05-05 NOTE — Telephone Encounter (Signed)
  Follow up Call-  Call back number 05/04/2015  Post procedure Call Back phone  # [phone]  Permission to leave phone message Yes     Patient questions:  Do you have a fever, pain , or abdominal swelling? No. Pain Score  0 *  Have you tolerated food without any problems? Yes.    Have you been able to return to your normal activities? Yes.    Do you have any questions about your discharge instructions: Diet   No. Medications  No. Follow up visit  No.  Do you have questions or concerns about your Care? No.  Actions: * If pain score is 4 or above: No action needed, pain <4.

## 2015-05-09 ENCOUNTER — Ambulatory Visit (INDEPENDENT_AMBULATORY_CARE_PROVIDER_SITE_OTHER): Payer: Medicare Other

## 2015-05-09 DIAGNOSIS — Z23 Encounter for immunization: Secondary | ICD-10-CM | POA: Diagnosis not present

## 2015-05-10 ENCOUNTER — Telehealth: Payer: Self-pay | Admitting: Gastroenterology

## 2015-05-10 NOTE — Telephone Encounter (Signed)
See results note. 

## 2015-05-30 ENCOUNTER — Ambulatory Visit: Payer: Medicare Other | Admitting: Cardiothoracic Surgery

## 2015-05-30 ENCOUNTER — Other Ambulatory Visit: Payer: Medicare Other

## 2015-06-06 ENCOUNTER — Ambulatory Visit (INDEPENDENT_AMBULATORY_CARE_PROVIDER_SITE_OTHER): Payer: Medicare Other | Admitting: Family Medicine

## 2015-06-06 ENCOUNTER — Encounter: Payer: Self-pay | Admitting: Family Medicine

## 2015-06-06 VITALS — BP 126/90 | HR 71 | Temp 98.3°F | Wt 189.2 lb

## 2015-06-06 DIAGNOSIS — Z0000 Encounter for general adult medical examination without abnormal findings: Secondary | ICD-10-CM | POA: Diagnosis not present

## 2015-06-06 DIAGNOSIS — B1920 Unspecified viral hepatitis C without hepatic coma: Secondary | ICD-10-CM

## 2015-06-06 DIAGNOSIS — Z23 Encounter for immunization: Secondary | ICD-10-CM

## 2015-06-06 DIAGNOSIS — R911 Solitary pulmonary nodule: Secondary | ICD-10-CM

## 2015-06-06 DIAGNOSIS — Z7189 Other specified counseling: Secondary | ICD-10-CM

## 2015-06-06 MED ORDER — VERAPAMIL HCL ER 240 MG PO TBCR: 240.0000 mg | EXTENDED_RELEASE_TABLET | Freq: Every day | ORAL | Status: DC

## 2015-06-06 NOTE — Patient Instructions (Signed)
Take care.  Glad to see you.  Don't change your meds for now. 

## 2015-06-06 NOTE — Progress Notes (Signed)
Pre visit review using our clinic review tool, if applicable. No additional management support is needed unless otherwise documented below in the visit note.  I have personally reviewed the Medicare Annual Wellness questionnaire and have noted 1. The patient's medical and social history 2. Their use of alcohol, tobacco or illicit drugs 3. Their current medications and supplements 4. The patient's functional ability including ADL's, fall risks, home safety risks and hearing or visual             impairment. 5. Diet and physical activities 6. Evidence for depression or mood disorders  The patients weight, height, BMI have been recorded in the chart and visual acuity is per eye clinic.  I have made referrals, counseling and provided education to the patient based review of the above and I have provided the pt with a written personalized care plan for preventive services.  Provider list updated- see scanned forms.  Routine anticipatory guidance given to patient.  See health maintenance.  Flu 2016 Shingles 2014 PNA 2016 Tetanus 09/2014, done out of clinic.  Colonoscopy 2016 Breast cancer screening 2016 DXA declined, d/w pt.  Advance directive- son Orvan July if patient were incapacitated.   Cognitive function addressed- see scanned forms- and if abnormal then additional documentation follows.   Hypertrophic cardiomyopathy per cards, compliant with med w/o ADE.  Feeling well.    PMH and SH reviewed  Meds, vitals, and allergies reviewed.   ROS: See HPI.  Otherwise negative.    GEN: nad, alert and oriented HEENT: mucous membranes moist NECK: supple w/o LA CV: rrr. PULM: ctab, no inc wob ABD: soft, +bs EXT: no edema SKIN: no acute rash

## 2015-06-07 DIAGNOSIS — Z7189 Other specified counseling: Secondary | ICD-10-CM | POA: Insufficient documentation

## 2015-06-07 NOTE — Assessment & Plan Note (Signed)
She has f/u with chest clinic pending re: nodules.

## 2015-06-07 NOTE — Assessment & Plan Note (Signed)
She'll call about f/u with GI re: possible CT.

## 2015-06-07 NOTE — Assessment & Plan Note (Signed)
Flu 2016 Shingles 2014 PNA 2016 Tetanus 09/2014, done out of clinic.  Colonoscopy 2016 Breast cancer screening 2016 DXA declined, d/w pt.  Advance directive- son [name] July if patient were incapacitated.   Cognitive function addressed- see scanned forms- and if abnormal then additional documentation follows.

## 2015-06-15 ENCOUNTER — Encounter: Payer: Self-pay | Admitting: Cardiothoracic Surgery

## 2015-06-15 ENCOUNTER — Ambulatory Visit (INDEPENDENT_AMBULATORY_CARE_PROVIDER_SITE_OTHER): Payer: Medicare Other | Admitting: Cardiothoracic Surgery

## 2015-06-15 ENCOUNTER — Ambulatory Visit
Admission: RE | Admit: 2015-06-15 | Discharge: 2015-06-15 | Disposition: A | Discharge status: Home / Self Care | Payer: Medicare Other | Source: Ambulatory Visit | Attending: Cardiothoracic Surgery | Admitting: Cardiothoracic Surgery

## 2015-06-15 VITALS — BP 158/91 | HR 89 | Resp 16 | Ht 65.0 in | Wt 189.0 lb

## 2015-06-15 DIAGNOSIS — D381 Neoplasm of uncertain behavior of trachea, bronchus and lung: Secondary | ICD-10-CM

## 2015-06-15 NOTE — Progress Notes (Signed)
[address]       [place] [address]             [phone]                    Melanny J Nucci [facility] Record [id number] Date of Birth: 1951/03/11  Referring: Tonia Ghent, MD Primary Care: Elsie Stain, MD  Chief Complaint:    Chief Complaint  Patient presents with  . Lung Lesion    6 month f/u with CT CHEST...SUPER-D    History of Present Illness:    [name] 64 y.o. female is seen in the office  today for evaluation of a small, less than 1 cm left lung nodule. The patient originally because of abdominal complaints had a CT of the abdomen May 2014. This suggested a left lower lobe lung nodule. Follow-up CT scans showed this original nodule was not present on an additional area in the left lower lobe has been followed on scan September 20 09/18/2013 May 2016. The patient has been a former smoker, quit 10 years ago but prior to that smoked for close to 40 years, at least a 30 pack year history. She also has a family history of lung cancer.      Current Activity/ Functional Status:  Patient is independent with mobility/ambulation, transfers, ADL's, IADL's.   Zubrod Score: At the time of surgery this patient's most appropriate activity status/level should be described as: [x]     0    Normal activity, no symptoms []     1    Restricted in physical strenuous activity but ambulatory, able to do out light work []     2    Ambulatory and capable of self care, unable to do work activities, up and about               >50 % of waking hours                              []     3    Only limited self care, in bed greater than 50% of waking hours []     4    Completely disabled, no self care, confined to bed or chair []     5    Moribund   Past Medical History  Diagnosis Date  . Hypertrophic cardiomyopathy ([place])   . HCV (hepatitis C virus)     treated and cured 2016  . GERD (gastroesophageal reflux disease)     occasional; OTC as needed    . Trigger finger of left hand 09/2012    left long finger  . Palpitations    History of hepatitis C, has just completed 3 months of [place]  Past Surgical History  Procedure Laterality Date  . Oophorectomy Right 1980  . Myomectomy  10/09/99    x 2  . Partial hysterectomy  2002    with no remaining ovary  . Ovarian cyst drainage  1980  . Cholecystectomy  07/17/2011    Procedure: LAPAROSCOPIC CHOLECYSTECTOMY WITH INTRAOPERATIVE CHOLANGIOGRAM;  Surgeon: Haywood Lasso, MD;  Location: [facility];  Service: General;  Laterality: N/A;  . Oophorectomy Left 2000  . Breast reduction surgery Bilateral 2001  . Laceration repair Right 1996    thumb  . Foot ganglion excision Right   . Hemorrhoid surgery  1980  . Trigger finger release Left 09/28/2012  Procedure: RELEASE TRIGGER FINGER/A-1 PULLEY;  Surgeon: Jolyn Nap, MD;  Location: [facility];  Service: Orthopedics;  Laterality: Left;    Family History  Problem Relation Age of Onset  . Colon cancer Father   . Hepatitis C Brother   . Colon cancer      Aunt  . Diabetes Sister   . Hypertension Brother   . Hepatitis C Brother   . Colon cancer Paternal Aunt   . Breast cancer Paternal Aunt   . Lung cancer Paternal Uncle   . Breast cancer Cousin   . Liver cancer Cousin    PATIENT NOTES STRONG FAMILY HISTORY OF LUNG CANCER FOR ALL PULSE OF DIED OF LUNG CANCER IN ONE AUNT ALL 100 MOTHER SIDE SHE HAS 2 BROTHERS AND ONE SISTER ARE HEALTHY  History   Social History  . Marital Status: Single    Spouse Name: N/A  . Number of Children: 2  . Years of Education: N/A   Occupational History  . [employer] from home    Social History Main Topics  . Smoking status: Former Smoker -- 2.50 packs/day for 40 years    Types: Cigarettes    Quit date: 07/07/2005  . Smokeless tobacco: Never Used     Comment: quit smoking 2008  . Alcohol Use: No  . Drug Use: No     Comment: previous hx. of  . Sexual Activity: Yes    Birth Control/  Protection: Surgical    Social History Narrative   Divorced 1982   2 children (16 months apart)    History  Smoking status  . Former Smoker -- 2.50 packs/day for 40 years  . Types: Cigarettes  . Quit date: 07/07/2005  Smokeless tobacco  . Never Used    Comment: quit smoking 2008    History  Alcohol Use No     Allergies  Allergen Reactions  . Morphine Sulfate Swelling  . Penicillins Swelling    Current Outpatient Prescriptions  Medication Sig Dispense Refill  . cyclobenzaprine (FLEXERIL) 10 MG tablet Take 0.5-1 tablets (5-10 mg total) by mouth 3 (three) times daily as needed for muscle spasms (sedation caution). 30 tablet 1  . verapamil (CALAN-SR) 240 MG CR tablet Take 1 tablet (240 mg total) by mouth daily. 90 tablet 3   No current facility-administered medications for this visit.      Review of Systems:     Cardiac Review of Systems: Y or N  Chest Pain [  N  ]  Resting SOB [ N  ] Exertional SOB  [ Y ]  Orthopnea [N]   Pedal Edema Aqua.Slicker   ]    Palpitations [ N ] Syncope  [ N ]   Presyncope [  N ]  General Review of Systems: [Y] = yes [  ]=no Constitional: recent weight change Aqua.Slicker  ];  Wt loss over the last 3 months [   ] anorexia [ N ]; fatigue [  N]; nausea Aqua.Slicker  ]; night sweats Aqua.Slicker ]; fever [ N ]; or chills [  ];          Dental: poor dentition[  ]; Last Dentist visit:   Eye : blurred vision [  ]; diplopia [   ]; vision changes [  ];  Amaurosis fugax[  ]; Resp: cough Aqua.Slicker  ];  wheezing[ N ];  hemoptysis[N  ]; shortness of breath[ N ]; paroxysmal nocturnal dyspnea[  ]; dyspnea on exertion[ MILD ]; or orthopnea[  ];  GI:  gallstones[  ], vomiting[  ];  dysphagia[  ]; melena[  ];  hematochezia [  ]; heartburn[  ];   Hx of  Colonoscopy[RECENTLY ]; HX OF hEP c GU: kidney stones [  ]; hematuria[N  ];   dysuria [  ];  nocturia[  ];  history of     obstruction [  ]; urinary frequency [  N]             Skin: rash, swelling[  ];, hair loss[  ];  peripheral edema[  ];  or itching[   ]; Musculosketetal: myalgias[  ];  joint swelling[  ];  joint erythema[  ];  joint pain[  ];  back pain[  ];  Heme/Lymph: bruising[  ];  bleeding[  ];  anemia[  ];  Neuro: TIA[  ];  headaches[  ];  stroke[ N ];  vertigo[  ];  seizures[ N ];   paresthesias[  ];  difficulty walking[ N ];  Psych:depression[ N ]; anxiety[N  ];  Endocrine: diabetes[ N ];  thyroid dysfunction[  ];  Immunizations: Flu up to date [ Y ]; Pneumococcal up to date [ Y ];  Other:  Physical Exam: BP 158/91 mmHg  Pulse 89  Resp 16  Ht 5\' 5"  (1.651 m)  Wt 189 lb (85.73 kg)  BMI 31.45 kg/m2  SpO2 98%  PHYSICAL EXAMINATION: General appearance: alert, cooperative, appears stated age and no distress Head: Normocephalic, without obvious abnormality, atraumatic Neck: no adenopathy, no carotid bruit, no JVD, supple, symmetrical, trachea midline and thyroid not enlarged, symmetric, no tenderness/mass/nodules Lymph nodes: Cervical, supraclavicular, and axillary nodes normal. Resp: clear to auscultation bilaterally Back: symmetric, no curvature. ROM normal. No CVA tenderness. Cardio: regular rate and rhythm, S1, S2 normal, no murmur, click, rub or gallop, no click and no rub GI: soft, non-tender; bowel sounds normal; no masses,  no organomegaly Extremities: extremities normal, atraumatic, no cyanosis or edema and Homans sign is negative, no sign of DVT No carotid bruits, full DP and PT pulses bilaterally  Diagnostic Studies & Laboratory data:     Recent Radiology Findings:  Ct Super D Chest Wo Contrast  06/15/2015  CLINICAL DATA:  64 year old female with history of left lower lobe lung nodule diagnosed 2015. EXAM: CT CHEST WITHOUT CONTRAST TECHNIQUE: Multidetector CT imaging of the chest was performed using thin slice collimation for electromagnetic bronchoscopy planning purposes, without intravenous contrast. COMPARISON:  None. FINDINGS: Mediastinum/Lymph Nodes: Heart size is normal. There is no significant pericardial  fluid, thickening or pericardial calcification. There is atherosclerosis of the thoracic aorta, the great vessels of the mediastinum and the coronary arteries, including calcified atherosclerotic plaque in the left main, left anterior descending, left circumflex and right coronary arteries. No pathologically enlarged mediastinal or hilar lymph nodes. Please note that accurate exclusion of hilar adenopathy is limited on noncontrast CT scans. Esophagus is unremarkable in appearance. No axillary lymphadenopathy. Lungs/Pleura: Previously noted ground-glass attenuation nodule in the left lower lobe is far less conspicuous on today's examination now a barely perceptible 5 mm ground glass attenuation nodule (image 43 of series 4). No other suspicious appearing pulmonary nodules or masses are noted. No acute consolidative airspace disease. No pleural effusions. Upper abdomen: Status post cholecystectomy. Musculoskeletal: There are no aggressive appearing lytic or blastic lesions noted in the visualized portions of the skeleton. IMPRESSION: 1. Interval decrease in size of very subtle 5 mm ground-glass attenuation nodule in the left lower lobe. This lesion has waxed and waned in  size compared to prior studies dating back to 03/26/2013, and is favored to be a benign area of recurrent inflammation. One additional noncontrast chest CT is recommended in 12 months to ensure greater than 3 years of overall stability. 2. Atherosclerosis, including left main and 3 vessel coronary artery disease. Please note that although the presence of coronary artery calcium documents the presence of coronary artery disease, the severity of this disease and any potential stenosis cannot be assessed on this non-gated CT examination. Assessment for potential risk factor modification, dietary therapy or pharmacologic therapy may be warranted, if clinically indicated. Electronically Signed   By: Vinnie Langton M.D.   On: 06/15/2015 12:37    Ct  Chest Wo Contrast  11/10/2014   CLINICAL DATA:  Pulmonary nodule follow-up  EXAM: CT CHEST WITHOUT CONTRAST  TECHNIQUE: Multidetector CT imaging of the chest was performed following the standard protocol without IV contrast.  COMPARISON:  10/04/2013  FINDINGS: The central airways are patent. There is a 10 mm irregular ground-glass nodule in the left lower lobe image 45/series 3 which previously measured 7 mm. There is no pulmonary mass. There is no focal consolidation. There is no pleural effusion or pneumothorax.  There are no pathologically enlarged axillary, hilar or mediastinal lymph nodes.  The heart size is normal. There is no pericardial effusion. The thoracic aorta is normal in caliber.  Review of bone windows demonstrates no focal lytic or sclerotic lesions.  Limited non-contrast images of the upper abdomen were obtained. The adrenal glands appear normal. The remainder of the visualized abdominal organs are unremarkable.  IMPRESSION: 1. Irregular 10 mm ground-glass nodule in the left lower lobe which is more conspicuous compared with the prior examination. The size of is slightly increased on the axial images, but on the coronal images appears stable. Adenocarcinoma cannot be excluded. Thoracic surgery consultation is recommended. Follow up by CT is recommended in 12 months, with continued annual surveillance for a minimum of 3 years. These recommendations are taken from: Recommendations for the Management of Subsolid Pulmonary Nodules Detected at CT: A Statement from the [facility] 2013; 266:1, [phone].   Electronically Signed   By: Kathreen Devoid   On: 11/10/2014 16:03   I have independently reviewed the above radiology studies  and reviewed the findings with the patient.    Mm Digital Screening Bilateral  11/11/2014   CLINICAL DATA:  Screening. Prior bilateral reduction mammoplasties in 2001.  EXAM: DIGITAL SCREENING BILATERAL MAMMOGRAM WITH CAD  COMPARISON:  Previous exam(s).  ACR  Breast Density Category b: There are scattered areas of fibroglandular density.  FINDINGS: There are no findings suspicious for malignancy. Expected post surgical changes in both breasts related to the prior reduction surgery. Images were processed with CAD.  IMPRESSION: No mammographic evidence of malignancy. A result letter of this screening mammogram will be mailed directly to the patient.  RECOMMENDATION: Screening mammogram in one year. (Code:SM-B-01Y)  BI-RADS CATEGORY  2: Benign.   Electronically Signed   By: Evangeline Dakin M.D.   On: 11/11/2014 16:52     I have independently reviewed the above radiologic studies.  Recent Lab Findings: Lab Results  Component Value Date   WBC 7.3 11/18/2013   HGB 13.1 11/18/2013   HCT 39.7 11/18/2013   PLT 202.0 11/18/2013   GLUCOSE 96 04/03/2015   CHOL 152 03/10/2013   TRIG 108.0 03/10/2013   HDL 42.20 03/10/2013   LDLCALC 88 03/10/2013   ALT 18 04/03/2015   AST 18 04/03/2015  NA 139 04/03/2015   K 4.2 04/03/2015   CL 102 04/03/2015   CREATININE 0.64 04/03/2015   BUN 9 04/03/2015   CO2 31 04/03/2015   TSH 0.91 04/21/2008   INR 0.98 12/26/2011      Assessment / Plan:   Patient with small left lung nodule present on CT scan since 2014, followed with CT with very slight change . With the patient's increased risk for lung cancer I recommended to her that we perform a repeat CT scan in 12 months   . The scans have been reviewed with her and she is agreeable with this approach. She is aware of the noted calcification of the coronary arteries on her CT scan and notes that she has a cardiologist that she sees currently.  Grace Isaac MD      [address] [place] [address] Office [phone]   Beeper [phone]  06/15/2015 2:54 PM

## 2015-06-20 ENCOUNTER — Telehealth: Payer: Self-pay | Admitting: Gastroenterology

## 2015-06-20 DIAGNOSIS — R109 Unspecified abdominal pain: Secondary | ICD-10-CM

## 2015-06-20 NOTE — Telephone Encounter (Signed)
Scheduled CT abdomen/pelvis on 06/26/15 at 7:30 AM at [facility]. NPO 4 hours prior. Contrast at 5:30 and 6:30 AM. Patient has contrast. She will need BUN and Creatinine also. Patient aware and will come for labs.

## 2015-06-22 ENCOUNTER — Other Ambulatory Visit (INDEPENDENT_AMBULATORY_CARE_PROVIDER_SITE_OTHER): Payer: Medicare Other

## 2015-06-22 DIAGNOSIS — R109 Unspecified abdominal pain: Secondary | ICD-10-CM

## 2015-06-22 LAB — CREATININE, SERUM: Creatinine, Ser: 0.68 mg/dL (ref 0.40–1.20)

## 2015-06-22 LAB — BUN: BUN: 6 mg/dL (ref 6–23)

## 2015-06-26 ENCOUNTER — Ambulatory Visit (HOSPITAL_COMMUNITY)
Admission: RE | Admit: 2015-06-26 | Discharge: 2015-06-26 | Disposition: A | Discharge status: Home / Self Care | Payer: Medicare Other | Source: Ambulatory Visit | Attending: Gastroenterology | Admitting: Gastroenterology

## 2015-06-26 ENCOUNTER — Encounter (HOSPITAL_COMMUNITY): Payer: Self-pay

## 2015-06-26 DIAGNOSIS — D259 Leiomyoma of uterus, unspecified: Secondary | ICD-10-CM | POA: Insufficient documentation

## 2015-06-26 DIAGNOSIS — K449 Diaphragmatic hernia without obstruction or gangrene: Secondary | ICD-10-CM | POA: Insufficient documentation

## 2015-06-26 DIAGNOSIS — I2510 Atherosclerotic heart disease of native coronary artery without angina pectoris: Secondary | ICD-10-CM | POA: Diagnosis not present

## 2015-06-26 DIAGNOSIS — K5790 Diverticulosis of intestine, part unspecified, without perforation or abscess without bleeding: Secondary | ICD-10-CM | POA: Insufficient documentation

## 2015-06-26 DIAGNOSIS — Z9049 Acquired absence of other specified parts of digestive tract: Secondary | ICD-10-CM | POA: Insufficient documentation

## 2015-06-26 DIAGNOSIS — R109 Unspecified abdominal pain: Secondary | ICD-10-CM | POA: Diagnosis not present

## 2015-06-26 MED ORDER — IOHEXOL 300 MG/ML  SOLN
100.0000 mL | Freq: Once | INTRAMUSCULAR | Status: AC | PRN
  Administered 2015-06-26: 100 mL via INTRAVENOUS

## 2015-08-20 ENCOUNTER — Other Ambulatory Visit: Payer: Self-pay | Admitting: Family Medicine

## 2015-09-08 ENCOUNTER — Ambulatory Visit: Payer: Medicare Other | Admitting: Family Medicine

## 2015-09-11 ENCOUNTER — Ambulatory Visit: Payer: Medicare Other | Admitting: Family Medicine

## 2015-09-18 ENCOUNTER — Ambulatory Visit: Payer: Medicare Other | Admitting: Family Medicine

## 2015-09-19 ENCOUNTER — Encounter: Payer: Self-pay | Admitting: Family Medicine

## 2015-09-19 ENCOUNTER — Ambulatory Visit (INDEPENDENT_AMBULATORY_CARE_PROVIDER_SITE_OTHER): Payer: Medicare Other | Admitting: Family Medicine

## 2015-09-19 VITALS — BP 130/88 | HR 70 | Temp 98.6°F | Wt 187.5 lb

## 2015-09-19 DIAGNOSIS — H6981 Other specified disorders of Eustachian tube, right ear: Secondary | ICD-10-CM | POA: Diagnosis not present

## 2015-09-19 DIAGNOSIS — R1011 Right upper quadrant pain: Secondary | ICD-10-CM | POA: Diagnosis not present

## 2015-09-19 MED ORDER — FLUTICASONE PROPIONATE 50 MCG/ACT NA SUSP: 2.0000 | Freq: Every day | NASAL | Status: DC

## 2015-09-19 MED ORDER — CYCLOBENZAPRINE HCL 10 MG PO TABS: 5.0000 mg | ORAL_TABLET | Freq: Three times a day (TID) | ORAL | Status: DC | PRN

## 2015-09-19 NOTE — Patient Instructions (Signed)
Use flonase and see if that helps the ear troubles.  It should gradually get better.  Rosaria Ferries will call about your referral for the ultrasound. We'll go from there.  Take care.  Glad to see you.

## 2015-09-19 NOTE — Progress Notes (Signed)
Pre visit review using our clinic review tool, if applicable. No additional management support is needed unless otherwise documented below in the visit note.  Her cousin recently died, I offered my condolences.  She was caring for her and strained her R back.  Flexeril helped prev.  Needed refill.  She has been stretching.    Ear sx.  R ear sx.  Vibration and noise/echo noted.  Going on for about 1 month.  No ear pain.  No L sided sx.  Balance is off intermittently, to the left, but she isn't dizzy, the room doesn't spin.  No sx sitting.  No FCNAVD.    She feels well o/w except for a recurrent RUQ pain over the last month, noted again this AM.  No jaundice, no yellowing of the eyes.  Has seen GI prev, with neg CT abd.    She has found that some foods, ie peanut butter, cause the prev "shaking" in the abdomen, but unclear what other foods cause it.  She does better if fasting.  That seems to be a different issue/sx from the RUQ pain above.   Meds, vitals, and allergies reviewed.   ROS: See HPI.  Otherwise, noncontributory.  GEN: nad, alert and oriented HEENT: mucous membranes moist, TM wnl but slow TM movement on valsalva in R ear.  NECK: supple w/o LA CV: rrr PULM: ctab, no inc wob ABD: soft, +bs, not ttp, no rebound EXT: no edema SKIN: no acute rash

## 2015-09-20 ENCOUNTER — Ambulatory Visit
Admission: RE | Admit: 2015-09-20 | Discharge: 2015-09-20 | Disposition: A | Discharge status: Home / Self Care | Payer: Medicare Other | Source: Ambulatory Visit | Attending: Family Medicine | Admitting: Family Medicine

## 2015-09-20 DIAGNOSIS — R1011 Right upper quadrant pain: Secondary | ICD-10-CM

## 2015-09-21 DIAGNOSIS — R1011 Right upper quadrant pain: Secondary | ICD-10-CM | POA: Insufficient documentation

## 2015-09-21 DIAGNOSIS — H6980 Other specified disorders of Eustachian tube, unspecified ear: Secondary | ICD-10-CM | POA: Insufficient documentation

## 2015-09-21 NOTE — Assessment & Plan Note (Signed)
Likely causing her sx.  Add on flonase and update me as needed.  See avs.  >25 minutes spent in face to face time with patient, >50% spent in counselling or coordination of care.

## 2015-09-21 NOTE — Assessment & Plan Note (Signed)
Prev neg CT and LFTs.  D/w pt.  Recheck u/s and see notes on imaging.  Okay for outpatient f/u.  I question if she could have a MSK source, ie possible abd wall pain.  Either way, okay for outpatient f/u.  She agrees.

## 2016-02-15 ENCOUNTER — Other Ambulatory Visit: Payer: Self-pay | Admitting: Family Medicine

## 2016-02-15 DIAGNOSIS — Z1231 Encounter for screening mammogram for malignant neoplasm of breast: Secondary | ICD-10-CM

## 2016-02-21 ENCOUNTER — Ambulatory Visit
Admission: RE | Admit: 2016-02-21 | Discharge: 2016-02-21 | Disposition: A | Discharge status: Home / Self Care | Payer: Medicare Other | Source: Ambulatory Visit | Attending: Family Medicine | Admitting: Family Medicine

## 2016-02-21 DIAGNOSIS — Z1231 Encounter for screening mammogram for malignant neoplasm of breast: Secondary | ICD-10-CM

## 2016-03-04 ENCOUNTER — Encounter: Payer: Self-pay | Admitting: Family Medicine

## 2016-03-04 ENCOUNTER — Ambulatory Visit (INDEPENDENT_AMBULATORY_CARE_PROVIDER_SITE_OTHER): Payer: Medicare Other | Admitting: Family Medicine

## 2016-03-04 ENCOUNTER — Ambulatory Visit (INDEPENDENT_AMBULATORY_CARE_PROVIDER_SITE_OTHER)
Admission: RE | Admit: 2016-03-04 | Discharge: 2016-03-04 | Disposition: A | Discharge status: Home / Self Care | Payer: Medicare Other | Source: Ambulatory Visit | Attending: Family Medicine | Admitting: Family Medicine

## 2016-03-04 VITALS — BP 140/84 | HR 78 | Temp 98.5°F | Wt 187.5 lb

## 2016-03-04 DIAGNOSIS — S99921A Unspecified injury of right foot, initial encounter: Secondary | ICD-10-CM | POA: Diagnosis not present

## 2016-03-04 DIAGNOSIS — M79671 Pain in right foot: Secondary | ICD-10-CM

## 2016-03-04 DIAGNOSIS — S99929A Unspecified injury of unspecified foot, initial encounter: Secondary | ICD-10-CM | POA: Insufficient documentation

## 2016-03-04 MED ORDER — VERAPAMIL HCL ER 240 MG PO TBCR: 240.0000 mg | EXTENDED_RELEASE_TABLET | Freq: Every day | ORAL | 3 refills | Status: DC

## 2016-03-04 NOTE — Progress Notes (Signed)
She'll need refill on verapamil soon, done at [facility].  D/w pt. No CP, no SOB (can walk on treadmill w/o troubles).  Some occ palpitations.  She didn't want to go through with further monitoring.  She'll monitor on her own and update me/cardiology.  Encouraged cardiology f/u, due at this point.    Due for flu shot. Done at [facility].   Straight pin in the carpet.  Stuck in R foot, she pulled it out.  This was about 1 month ago.  Still tender.    She realized at [facility] that she had a metal staple in her R flip flop that was irritating the same area as the pin injury, distal R 1st MT area.  Staple removed from flip flop at [facility].   Meds, vitals, and allergies reviewed.   ROS: Per HPI unless specifically indicated in ROS section   nad R foot with no FB noted but with callus at the distal R 1st MT area.  Normal DP pulse o/w, normal inspection o/w.

## 2016-03-04 NOTE — Assessment & Plan Note (Signed)
I didn't see FB on the xray.  Doesn't appear infected.  Likely with chronic mild callus formation from the abrasion from the metal that was in the flip flop, incidentally noted today.  D/w pt.  Should resolve.  F/u prn.  Await overread on xray.  She agrees.

## 2016-03-04 NOTE — Patient Instructions (Signed)
Take care.  Glad to see you.  We'll contact you with your xray report.

## 2016-03-04 NOTE — Progress Notes (Signed)
Pre visit review using our clinic review tool, if applicable. No additional management support is needed unless otherwise documented below in the visit note. 

## 2016-04-06 NOTE — Progress Notes (Signed)
HPI The patient presents for followup of mild ventricular hypertrophy. She's also had palpitations. An echo in 2010 suggested moderate mitral regurgitation directed posteriorly. There was LVH. This was severe. She is also had some aortic insufficiency. However followup echocardiography in 2013 suggested only mild mitral regurgitation and no significant ventricular hypertrophy or aortic insufficiency.  She presents for follow up.  Since I last saw her she has done well.  The patient denies any new symptoms such as chest discomfort, neck or arm discomfort. There has been no new shortness of breath, PND or orthopnea. There have been no reported palpitations, presyncope or syncope.    She is exercising routinely.   Allergies  Allergen Reactions  . Morphine Sulfate Swelling  . Penicillins Swelling  . Acyclovir And Related     Current Outpatient Prescriptions  Medication Sig Dispense Refill  . verapamil (CALAN-SR) 240 MG CR tablet Take 1 tablet (240 mg total) by mouth daily. 90 tablet 3   No current facility-administered medications for this visit.     Past Medical History:  Diagnosis Date  . GERD (gastroesophageal reflux disease)    occasional; OTC as needed  . 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  . Hypertrophic cardiomyopathy ([place])   . Palpitations   . Trigger finger of left hand 09/2012   left long finger    Past Surgical History:  Procedure Laterality Date  . BREAST REDUCTION SURGERY Bilateral 2001  . CHOLECYSTECTOMY  07/17/2011   Procedure: LAPAROSCOPIC CHOLECYSTECTOMY WITH INTRAOPERATIVE CHOLANGIOGRAM;  Surgeon: Haywood Lasso, MD;  Location: [facility];  Service: General;  Laterality: N/A;  . FOOT GANGLION EXCISION Right   . [place]  . LACERATION REPAIR Right 1996   thumb  . MYOMECTOMY  10/09/99   x 2  . OOPHORECTOMY Right 1980  . OOPHORECTOMY Left 2000  . OVARIAN CYST DRAINAGE  1980  . PARTIAL HYSTERECTOMY  2002   with no remaining ovary  .  TRIGGER FINGER RELEASE Left 09/28/2012   Procedure: RELEASE TRIGGER FINGER/A-1 PULLEY;  Surgeon: Jolyn Nap, MD;  Location: [facility];  Service: Orthopedics;  Laterality: Left;    ROS:    As stated in the HPI and negative for all other systems.  PHYSICAL EXAM BP 130/88   Pulse 68   Ht 5\' 5"  (1.651 m)   Wt 189 lb (85.7 kg)   BMI 31.45 kg/m  GENERAL:  Well appearing NECK:  No jugular venous distention, waveform within normal limits, carotid upstroke brisk and symmetric, no bruits, no thyromegaly LYMPHATICS:  No cervical, inguinal adenopathy LUNGS:  Clear to auscultation bilaterally BACK:  No CVA tenderness CHEST:  Unremarkable HEART:  PMI not displaced or sustained,S1 and S2 within normal limits, no S3, no S4, no clicks, no rubs, no murmurs ABD:  Flat, positive bowel sounds normal in frequency in pitch, no bruits, no rebound, no guarding, no midline pulsatile mass, no hepatomegaly, no splenomegaly EXT:  2 plus pulses throughout, no edema, no cyanosis no clubbing  EKG:  Sinus rhythm, rate 68, axis within normal limits, intervals within normal limits, mild lateral T-wave inversions progress previous. 04/08/2016  .lipids  ASSESSMENT AND PLAN  LVH:  This was mild on the last echo. I will repeat another echo.  HTN:  The blood pressure is at target. No change in medications is indicated. We will continue with therapeutic lifestyle changes (TLC).  PALPITATIONS:  These are less severe and less frequent.  No  change in therapy is indicated.

## 2016-04-08 ENCOUNTER — Encounter: Payer: Self-pay | Admitting: Cardiology

## 2016-04-08 ENCOUNTER — Ambulatory Visit (INDEPENDENT_AMBULATORY_CARE_PROVIDER_SITE_OTHER): Payer: Medicare Other | Admitting: Cardiology

## 2016-04-08 VITALS — BP 130/88 | HR 68 | Ht 65.0 in | Wt 189.0 lb

## 2016-04-08 DIAGNOSIS — I517 Cardiomegaly: Secondary | ICD-10-CM | POA: Diagnosis not present

## 2016-04-08 NOTE — Patient Instructions (Signed)
Medication Instructions:  Continue current medications  Labwork: None Ordered  Testing/Procedures: Your physician has requested that you have an echocardiogram. Echocardiography is a painless test that uses sound waves to create images of your heart. It provides your doctor with information about the size and shape of your heart and how well your heart's chambers and valves are working. This procedure takes approximately one hour. There are no restrictions for this procedure.  Follow-Up: Your physician wants you to follow-up in: 18 Months. You will receive a reminder letter in the mail two months in advance. If you don't receive a letter, please call our office to schedule the follow-up appointment.   Any Other Special Instructions Will Be Listed Below (If Applicable).   If you need a refill on your cardiac medications before your next appointment, please call your pharmacy.

## 2016-04-24 ENCOUNTER — Other Ambulatory Visit: Payer: Self-pay

## 2016-04-24 ENCOUNTER — Ambulatory Visit (HOSPITAL_COMMUNITY): Payer: Medicare Other | Attending: Cardiology

## 2016-04-24 DIAGNOSIS — Z87891 Personal history of nicotine dependence: Secondary | ICD-10-CM | POA: Diagnosis not present

## 2016-04-24 DIAGNOSIS — I517 Cardiomegaly: Secondary | ICD-10-CM | POA: Diagnosis present

## 2016-04-24 DIAGNOSIS — E785 Hyperlipidemia, unspecified: Secondary | ICD-10-CM | POA: Diagnosis not present

## 2016-04-24 DIAGNOSIS — I059 Rheumatic mitral valve disease, unspecified: Secondary | ICD-10-CM | POA: Diagnosis not present

## 2016-04-24 DIAGNOSIS — I119 Hypertensive heart disease without heart failure: Secondary | ICD-10-CM | POA: Insufficient documentation

## 2016-04-24 LAB — ECHOCARDIOGRAM COMPLETE
Ao-asc: 31 cm
E/e' ratio: 7.94
EWDT: 363 ms
FS: 51 % — AB (ref 28–44)
IV/PV OW: 1.04
LA diam end sys: 34 mm
LA diam index: 1.76 cm/m2
LA vol A4C: 36.8 ml
LA vol index: 18.8 mL/m2
LA vol: 36.3 mL
LASIZE: 34 mm
LV TDI E'LATERAL: 8.38
LV TDI E'MEDIAL: 7.18
LVEEAVG: 7.94
LVEEMED: 7.94
LVELAT: 8.38 cm/s
LVOT VTI: 23.9 cm
LVOT area: 4.15 cm2
LVOT diameter: 23 mm
LVOT peak grad rest: 5 mmHg
LVOTPV: 112 cm/s
LVOTSV: 99 mL
MV Dec: 363
MV pk A vel: 61.6 m/s
MVPKEVEL: 66.5 m/s
PW: 10.1 mm — AB (ref 0.6–1.1)

## 2016-05-17 ENCOUNTER — Other Ambulatory Visit: Payer: Self-pay | Admitting: *Deleted

## 2016-05-17 DIAGNOSIS — R918 Other nonspecific abnormal finding of lung field: Secondary | ICD-10-CM

## 2016-06-01 ENCOUNTER — Other Ambulatory Visit: Payer: Self-pay | Admitting: Family Medicine

## 2016-06-02 ENCOUNTER — Other Ambulatory Visit: Payer: Self-pay | Admitting: Family Medicine

## 2016-06-02 DIAGNOSIS — I10 Essential (primary) hypertension: Secondary | ICD-10-CM

## 2016-06-06 ENCOUNTER — Other Ambulatory Visit (INDEPENDENT_AMBULATORY_CARE_PROVIDER_SITE_OTHER): Payer: Medicare Other

## 2016-06-06 DIAGNOSIS — I10 Essential (primary) hypertension: Secondary | ICD-10-CM | POA: Diagnosis not present

## 2016-06-06 LAB — COMPREHENSIVE METABOLIC PANEL
ALK PHOS: 58 U/L (ref 39–117)
ALT: 19 U/L (ref 0–35)
AST: 18 U/L (ref 0–37)
Albumin: 4.3 g/dL (ref 3.5–5.2)
BILIRUBIN TOTAL: 0.6 mg/dL (ref 0.2–1.2)
BUN: 9 mg/dL (ref 6–23)
CO2: 30 meq/L (ref 19–32)
CREATININE: 0.69 mg/dL (ref 0.40–1.20)
Calcium: 9.7 mg/dL (ref 8.4–10.5)
Chloride: 104 mEq/L (ref 96–112)
GFR: 109.06 mL/min (ref >60.00)
GLUCOSE: 107 mg/dL — AB (ref 70–99)
Potassium: 4.2 mEq/L (ref 3.5–5.1)
SODIUM: 140 meq/L (ref 135–145)
TOTAL PROTEIN: 7.7 g/dL (ref 6.0–8.3)

## 2016-06-06 LAB — LIPID PANEL
CHOL/HDL RATIO: 5
Cholesterol: 211 mg/dL — ABNORMAL HIGH (ref 0–200)
HDL: 39.6 mg/dL (ref >39.00)
LDL Cholesterol: 141 mg/dL — ABNORMAL HIGH (ref 0–99)
NONHDL: 171.55
Triglycerides: 155 mg/dL — ABNORMAL HIGH (ref 0.0–149.0)
VLDL: 31 mg/dL (ref 0.0–40.0)

## 2016-06-13 ENCOUNTER — Ambulatory Visit (INDEPENDENT_AMBULATORY_CARE_PROVIDER_SITE_OTHER): Payer: Medicare Other | Admitting: Family Medicine

## 2016-06-13 ENCOUNTER — Encounter: Payer: Self-pay | Admitting: Family Medicine

## 2016-06-13 VITALS — BP 104/78 | HR 74 | Temp 98.5°F | Ht 65.0 in | Wt 191.5 lb

## 2016-06-13 DIAGNOSIS — Z0000 Encounter for general adult medical examination without abnormal findings: Secondary | ICD-10-CM

## 2016-06-13 DIAGNOSIS — K644 Residual hemorrhoidal skin tags: Secondary | ICD-10-CM | POA: Diagnosis not present

## 2016-06-13 DIAGNOSIS — E7800 Pure hypercholesterolemia, unspecified: Secondary | ICD-10-CM | POA: Diagnosis not present

## 2016-06-13 DIAGNOSIS — R05 Cough: Secondary | ICD-10-CM

## 2016-06-13 DIAGNOSIS — R059 Cough, unspecified: Secondary | ICD-10-CM

## 2016-06-13 DIAGNOSIS — I517 Cardiomegaly: Secondary | ICD-10-CM

## 2016-06-13 MED ORDER — HYDROCORTISONE ACETATE 25 MG RE SUPP: 25.0000 mg | Freq: Two times a day (BID) | RECTAL | 1 refills | Status: DC | PRN

## 2016-06-13 MED ORDER — VERAPAMIL HCL ER 240 MG PO TBCR: 240.0000 mg | EXTENDED_RELEASE_TABLET | Freq: Every day | ORAL | 3 refills | Status: DC

## 2016-06-13 NOTE — Progress Notes (Signed)
Pre visit review using our clinic review tool, if applicable. No additional management support is needed unless otherwise documented below in the visit note. 

## 2016-06-13 NOTE — Progress Notes (Signed)
I have personally reviewed the Medicare Annual Wellness questionnaire and have noted 1. The patient's medical and social history 2. Their use of alcohol, tobacco or illicit drugs 3. Their current medications and supplements 4. The patient's functional ability including ADL's, fall risks, home safety risks and hearing or visual             impairment. 5. Diet and physical activities 6. Evidence for depression or mood disorders  The patients weight, height, BMI have been recorded in the chart and visual acuity is per eye clinic.  I have made referrals, counseling and provided education to the patient based review of the above and I have provided the pt with a written personalized care plan for preventive services.  Provider list updated- see scanned forms.  Routine anticipatory guidance given to patient.  See health maintenance.  Flu 2017 Shingles 2014 PNA 2016 Tetanus 09/2014, done out of clinic.  Colonoscopy 2016 Breast cancer screening 2016 DXA declined, d/w pt.  Advance directive- son Orvan July if patient were incapacitated.   Cognitive function addressed- see scanned forms- and if abnormal then additional documentation follows.   I'll defer to CVTS- patient has f/u imaging and appointment pending.    Mild inc in sugar and lipids.  Weight is up slightly, with diet nonadherence. D/w pt.  Needs work on diet and gradual exercise.    Needed med refill for hemorrhoids.   Hypertrophic cardiomyopathy per cards, compliant with med w/o ADE.  Feeling well. Recent echo d/w pt.   Prev with cold sx about 1 month ago, still with cough.  All sx resolved except for the cough.  Cough is intermittent, slowly getting better.    PMH and SH reviewed  Meds, vitals, and allergies reviewed.   ROS: Per HPI.  Unless specifically indicated otherwise in HPI, the patient denies:  General: fever. Eyes: acute vision changes ENT: sore throat Cardiovascular: chest pain Respiratory: SOB GI:  vomiting GU: dysuria Musculoskeletal: acute back pain Derm: acute rash Neuro: acute motor dysfunction Psych: worsening mood Endocrine: polydipsia Heme: bleeding Allergy: hayfever  GEN: nad, alert and oriented HEENT: mucous membranes moist, nasal and OP exam within normal limits NECK: supple w/o LA CV: rrr. PULM: ctab, no inc wob ABD: soft, +bs EXT: no edema SKIN: no acute rash

## 2016-06-13 NOTE — Patient Instructions (Signed)
Gradual weight loss with diet and exercise.  Take care.  Glad to see you.  Update me as needed.  The cough should gradually get better.

## 2016-06-14 NOTE — Assessment & Plan Note (Signed)
Exam deferred, can use suppositories as needed. Prescription sent.

## 2016-06-14 NOTE — Assessment & Plan Note (Signed)
Likely postinfectious. Benign exam. Should resolve. Diagnosis discussed with patient.

## 2016-06-14 NOTE — Assessment & Plan Note (Signed)
No change in medication. Discussed with patient about gradual return to exercise. Encouraged diet.

## 2016-06-14 NOTE — Assessment & Plan Note (Signed)
Flu 2017 Shingles 2014 PNA 2016 Tetanus 09/2014, done out of clinic.  Colonoscopy 2016 Breast cancer screening 2016 DXA declined, d/w pt.  Advance directive- son Orvan July if patient were incapacitated.   Cognitive function addressed- see scanned forms- and if abnormal then additional documentation follows.

## 2016-06-14 NOTE — Assessment & Plan Note (Signed)
Discussed with patient about gradual return to exercise and diet.

## 2016-06-19 ENCOUNTER — Encounter: Payer: Self-pay | Admitting: Family Medicine

## 2016-06-20 ENCOUNTER — Ambulatory Visit (INDEPENDENT_AMBULATORY_CARE_PROVIDER_SITE_OTHER): Payer: Medicare Other | Admitting: Cardiothoracic Surgery

## 2016-06-20 ENCOUNTER — Other Ambulatory Visit: Payer: Medicare Other

## 2016-06-20 ENCOUNTER — Encounter: Payer: Self-pay | Admitting: Cardiothoracic Surgery

## 2016-06-20 ENCOUNTER — Ambulatory Visit
Admission: RE | Admit: 2016-06-20 | Discharge: 2016-06-20 | Disposition: A | Discharge status: Home / Self Care | Payer: Medicare Other | Source: Ambulatory Visit | Attending: Cardiothoracic Surgery | Admitting: Cardiothoracic Surgery

## 2016-06-20 VITALS — BP 127/82 | Resp 16 | Ht 65.0 in | Wt 189.0 lb

## 2016-06-20 DIAGNOSIS — D381 Neoplasm of uncertain behavior of trachea, bronchus and lung: Secondary | ICD-10-CM | POA: Diagnosis not present

## 2016-06-20 DIAGNOSIS — R918 Other nonspecific abnormal finding of lung field: Secondary | ICD-10-CM

## 2016-06-20 NOTE — Progress Notes (Signed)
[address]       [place] [address]             [phone]                    Melis J Erck [facility] Record [id number] Date of Birth: 06-25-1951  Referring: Tonia Ghent, MD Primary Care: Elsie Stain, MD  Chief Complaint:    Chief Complaint  Patient presents with  . Lung Lesion    1 year f/u with Chest CT    History of Present Illness:    [name] 65 y.o. female is seen in the office  today for evaluation of a small, less than 1 cm left lung nodule. The patient originally because of abdominal complaints had a CT of the abdomen May 2014. This suggested a left lower lobe lung nodule. Follow-up CT scans showed this original nodule was not present .  An  additional area in the left lower lobe has been followed on scan September in 2014 and 2016. The patient has been a former smoker, quit 10 years ago but prior to that smoked for close to 40 years, at least a 40 pack year history. She also has a family history of lung cancer.    Current Activity/ Functional Status:  Patient is independent with mobility/ambulation, transfers, ADL's, IADL's.   Zubrod Score: At the time of surgery this patient's most appropriate activity status/level should be described as: [x]     0    Normal activity, no symptoms []     1    Restricted in physical strenuous activity but ambulatory, able to do out light work []     2    Ambulatory and capable of self care, unable to do work activities, up and about               >50 % of waking hours                              []     3    Only limited self care, in bed greater than 50% of waking hours []     4    Completely disabled, no self care, confined to bed or chair []     5    Moribund   Past Medical History:  Diagnosis Date  . GERD (gastroesophageal reflux disease)    occasional; OTC as needed  . 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  . Hypertrophic cardiomyopathy ([place])   . Palpitations   .  Trigger finger of left hand 09/2012   left long finger   History of hepatitis C, has just completed 3 months of [place]  Past Surgical History:  Procedure Laterality Date  . BREAST REDUCTION SURGERY Bilateral 2001  . CHOLECYSTECTOMY  07/17/2011   Procedure: LAPAROSCOPIC CHOLECYSTECTOMY WITH INTRAOPERATIVE CHOLANGIOGRAM;  Surgeon: Haywood Lasso, MD;  Location: [facility];  Service: General;  Laterality: N/A;  . FOOT GANGLION EXCISION Right   . [place]  . LACERATION REPAIR Right 1996   thumb  . MYOMECTOMY  10/09/99   x 2  . OOPHORECTOMY Right 1980  . OOPHORECTOMY Left 2000  . OVARIAN CYST DRAINAGE  1980  . PARTIAL HYSTERECTOMY  2002   with no remaining ovary  . TRIGGER FINGER RELEASE Left 09/28/2012   Procedure: RELEASE TRIGGER FINGER/A-1 PULLEY;  Surgeon: Shanon Brow  Danella Maiers, MD;  Location: [facility];  Service: Orthopedics;  Laterality: Left;    Family History  Problem Relation Age of Onset  . Colon cancer Father   . Hepatitis C Brother   . Diabetes Sister   . Hypertension Brother   . Hepatitis C Brother   . Colon cancer Paternal Aunt   . Breast cancer Paternal Aunt   . Lung cancer Paternal Uncle   . Breast cancer Cousin   . Liver cancer Cousin   . Colon cancer      Aunt   PATIENT NOTES STRONG FAMILY HISTORY OF LUNG CANCER FOR ALL PULSE OF DIED OF LUNG CANCER IN ONE AUNT ALL 100 MOTHER SIDE SHE HAS 2 BROTHERS AND ONE SISTER ARE HEALTHY  History   Social History  . Marital Status: Single    Spouse Name: N/A  . Number of Children: 2  . Years of Education: N/A   Occupational History  . [employer] from home    Social History Main Topics  . Smoking status: Former Smoker -- 2.50 packs/day for 40 years    Types: Cigarettes    Quit date: 07/07/2005  . Smokeless tobacco: Never Used     Comment: quit smoking 2008  . Alcohol Use: No  . Drug Use: No     Comment: previous hx. of  . Sexual Activity: Yes    Birth Control/ Protection: Surgical     Social History Narrative   Divorced 1982   2 children (16 months apart)    History  Smoking Status  . Former Smoker  . Packs/day: 2.50  . Years: 40.00  . Types: Cigarettes  . Quit date: 07/07/2005  Smokeless Tobacco  . Never Used    Comment: quit smoking 2008    History  Alcohol Use  . 0.0 oz/week    Comment: a few glasses a wine a week     Allergies  Allergen Reactions  . Morphine Sulfate Swelling  . Penicillins Swelling  . Acyclovir And Related     Current Outpatient Prescriptions  Medication Sig Dispense Refill  . hydrocortisone (ANUSOL-HC) 25 MG suppository Place 1 suppository (25 mg total) rectally 2 (two) times daily as needed for hemorrhoids or itching. For hemorrhoids. 12 suppository 1  . verapamil (CALAN-SR) 240 MG CR tablet Take 1 tablet (240 mg total) by mouth daily. 90 tablet 3   No current facility-administered medications for this visit.       Review of Systems:     Cardiac Review of Systems: Y or N  Chest Pain [  N  ]  Resting SOB [ N  ] Exertional SOB  [ Y ]  Orthopnea [N]   Pedal Edema Aqua.Slicker   ]    Palpitations [ N ] Syncope  [ N ]   Presyncope [  N ]  General Review of Systems: [Y] = yes [  ]=no Constitional: recent weight change Aqua.Slicker  ];  Wt loss over the last 3 months [   ] anorexia [ N ]; fatigue [  N]; nausea Aqua.Slicker  ]; night sweats Aqua.Slicker ]; fever [ N ]; or chills [  ];          Dental: poor dentition[  ]; Last Dentist visit:   Eye : blurred vision [  ]; diplopia [   ]; vision changes [  ];  Amaurosis fugax[  ]; Resp: cough Aqua.Slicker  ];  wheezing[ N ];  hemoptysis[N  ]; shortness of breath[  N ]; paroxysmal nocturnal dyspnea[  ]; dyspnea on exertion[ MILD ]; or orthopnea[  ];  GI:  gallstones[  ], vomiting[  ];  dysphagia[  ]; melena[  ];  hematochezia [  ]; heartburn[  ];   Hx of  Colonoscopy[RECENTLY ]; HX OF hEP c GU: kidney stones [  ]; hematuria[N  ];   dysuria [  ];  nocturia[  ];  history of     obstruction [  ]; urinary frequency [  N]              Skin: rash, swelling[  ];, hair loss[  ];  peripheral edema[  ];  or itching[  ]; Musculosketetal: myalgias[  ];  joint swelling[  ];  joint erythema[  ];  joint pain[  ];  back pain[  ];  Heme/Lymph: bruising[  ];  bleeding[  ];  anemia[  ];  Neuro: TIA[  ];  headaches[  ];  stroke[ N ];  vertigo[  ];  seizures[ N ];   paresthesias[  ];  difficulty walking[ N ];  Psych:depression[ N ]; anxiety[N  ];  Endocrine: diabetes[ N ];  thyroid dysfunction[  ];  Immunizations: Flu up to date [ Y ]; Pneumococcal up to date [ Y ];  Other:  Physical Exam: BP 127/82 (BP Location: Right Arm, Patient Position: Sitting)   Resp 16   Ht 5\' 5"  (1.651 m)   Wt 189 lb (85.7 kg)   SpO2 96% Comment: ON RA  BMI 31.45 kg/m   PHYSICAL EXAMINATION: General appearance: alert, cooperative, appears stated age and no distress Head: Normocephalic, without obvious abnormality, atraumatic Neck: no adenopathy, no carotid bruit, no JVD, supple, symmetrical, trachea midline and thyroid not enlarged, symmetric, no tenderness/mass/nodules Lymph nodes: Cervical, supraclavicular, and axillary nodes normal. Resp: clear to auscultation bilaterally Back: symmetric, no curvature. ROM normal. No CVA tenderness. Cardio: regular rate and rhythm, S1, S2 normal, no murmur, click, rub or gallop, no click and no rub GI: soft, non-tender; bowel sounds normal; no masses,  no organomegaly Extremities: extremities normal, atraumatic, no cyanosis or edema and Homans sign is negative, no sign of DVT No carotid bruits, full DP and PT pulses bilaterally  Diagnostic Studies & Laboratory data:     Recent Radiology Findings:  Ct Chest Wo Contrast  Result Date: 06/20/2016 CLINICAL DATA:  Follow-up lung nodules. EXAM: CT CHEST WITHOUT CONTRAST TECHNIQUE: Multidetector CT imaging of the chest was performed following the standard protocol without IV contrast. COMPARISON:  Chest CT 06/15/2015 FINDINGS: Cardiovascular: Coronary artery calcification  and aortic atherosclerotic calcification. Mediastinum/Nodes: No axillary or supraclavicular adenopathy. No mediastinal hilar adenopathy. Lungs/Pleura: Small focus of linear thickening and ground-glass opacity in the LEFT lower lobe measures 6 mm (image 88, series 4) compared to 5 mm on most recent CT exam. Lesion is essentially stable from remote scan of 03/26/2013. No new pulmonary nodules. Upper Abdomen: Limited view of the liver, kidneys, pancreas are unremarkable. Normal adrenal glands. Musculoskeletal: No aggressive osseous lesion. IMPRESSION: 1. Stable ground-glass opacity in the LEFT lower lobe over three year interval is most consistent with benign etiology. No specific further follow-up recommended. 2.  Atherosclerotic calcification of the aorta. Electronically Signed   By: Suzy Bouchard M.D.   On: 06/20/2016 09:44   I have independently reviewed the above radiology studies  and reviewed the findings with the patient.    Ct Chest Wo Contrast  11/10/2014   CLINICAL DATA:  Pulmonary nodule follow-up  EXAM: CT CHEST  WITHOUT CONTRAST  TECHNIQUE: Multidetector CT imaging of the chest was performed following the standard protocol without IV contrast.  COMPARISON:  10/04/2013  FINDINGS: The central airways are patent. There is a 10 mm irregular ground-glass nodule in the left lower lobe image 45/series 3 which previously measured 7 mm. There is no pulmonary mass. There is no focal consolidation. There is no pleural effusion or pneumothorax.  There are no pathologically enlarged axillary, hilar or mediastinal lymph nodes.  The heart size is normal. There is no pericardial effusion. The thoracic aorta is normal in caliber.  Review of bone windows demonstrates no focal lytic or sclerotic lesions.  Limited non-contrast images of the upper abdomen were obtained. The adrenal glands appear normal. The remainder of the visualized abdominal organs are unremarkable.  IMPRESSION: 1. Irregular 10 mm ground-glass  nodule in the left lower lobe which is more conspicuous compared with the prior examination. The size of is slightly increased on the axial images, but on the coronal images appears stable. Adenocarcinoma cannot be excluded. Thoracic surgery consultation is recommended. Follow up by CT is recommended in 12 months, with continued annual surveillance for a minimum of 3 years. These recommendations are taken from: Recommendations for the Management of Subsolid Pulmonary Nodules Detected at CT: A Statement from the [facility] 2013; 266:1, [phone].   Electronically Signed   By: Kathreen Devoid   On: 11/10/2014 16:03   I have independently reviewed the above radiology studies  and reviewed the findings with the patient.    Mm Digital Screening Bilateral  11/11/2014   CLINICAL DATA:  Screening. Prior bilateral reduction mammoplasties in 2001.  EXAM: DIGITAL SCREENING BILATERAL MAMMOGRAM WITH CAD  COMPARISON:  Previous exam(s).  ACR Breast Density Category b: There are scattered areas of fibroglandular density.  FINDINGS: There are no findings suspicious for malignancy. Expected post surgical changes in both breasts related to the prior reduction surgery. Images were processed with CAD.  IMPRESSION: No mammographic evidence of malignancy. A result letter of this screening mammogram will be mailed directly to the patient.  RECOMMENDATION: Screening mammogram in one year. (Code:SM-B-01Y)  BI-RADS CATEGORY  2: Benign.   Electronically Signed   By: Evangeline Dakin M.D.   On: 11/11/2014 16:52     I have independently reviewed the above radiologic studies.  Recent Lab Findings: Lab Results  Component Value Date   WBC 7.3 11/18/2013   HGB 13.1 11/18/2013   HCT 39.7 11/18/2013   PLT 202.0 11/18/2013   GLUCOSE 107 (H) 06/06/2016   CHOL 211 (H) 06/06/2016   TRIG 155.0 (H) 06/06/2016   HDL 39.60 06/06/2016   LDLCALC 141 (H) 06/06/2016   ALT 19 06/06/2016   AST 18 06/06/2016   NA 140 06/06/2016    K 4.2 06/06/2016   CL 104 06/06/2016   CREATININE 0.69 06/06/2016   BUN 9 06/06/2016   CO2 30 06/06/2016   TSH 0.91 04/21/2008   INR 0.98 12/26/2011      Assessment / Plan:   Patient with small left lung nodule present on CT scan since 2014, followed with CT with very slight change , likely benign.  With the patient's increased risk for lung cancer I have referred her to [facility] for follow up scans  . The scans have been reviewed with her and she is agreeable with this approach. She is aware of the noted calcification of the coronary arteries on her CT scan and notes that she see Dr Percival Spanish ,  denies any angina  Grace Isaac MD      [address] [place] [address] Office [phone]   Beeper [phone]  06/20/2016 9:55 AM

## 2016-06-21 ENCOUNTER — Other Ambulatory Visit: Payer: Self-pay | Admitting: Family Medicine

## 2016-06-21 MED ORDER — HYDROCORTISONE ACETATE 25 MG RE SUPP: 25.0000 mg | Freq: Two times a day (BID) | RECTAL | 1 refills | Status: DC | PRN

## 2016-06-30 ENCOUNTER — Encounter (HOSPITAL_COMMUNITY): Payer: Self-pay

## 2016-06-30 ENCOUNTER — Emergency Department (HOSPITAL_COMMUNITY)
Admission: EM | Admit: 2016-06-30 | Discharge: 2016-06-30 | Disposition: A | Discharge status: Home / Self Care | Payer: Medicare Other | Attending: Emergency Medicine | Admitting: Emergency Medicine

## 2016-06-30 DIAGNOSIS — Y929 Unspecified place or not applicable: Secondary | ICD-10-CM | POA: Diagnosis not present

## 2016-06-30 DIAGNOSIS — Y939 Activity, unspecified: Secondary | ICD-10-CM | POA: Diagnosis not present

## 2016-06-30 DIAGNOSIS — M62830 Muscle spasm of back: Secondary | ICD-10-CM | POA: Insufficient documentation

## 2016-06-30 DIAGNOSIS — I10 Essential (primary) hypertension: Secondary | ICD-10-CM | POA: Insufficient documentation

## 2016-06-30 DIAGNOSIS — Z79899 Other long term (current) drug therapy: Secondary | ICD-10-CM | POA: Diagnosis not present

## 2016-06-30 DIAGNOSIS — Y999 Unspecified external cause status: Secondary | ICD-10-CM | POA: Insufficient documentation

## 2016-06-30 DIAGNOSIS — M62838 Other muscle spasm: Secondary | ICD-10-CM

## 2016-06-30 DIAGNOSIS — X500XXA Overexertion from strenuous movement or load, initial encounter: Secondary | ICD-10-CM | POA: Diagnosis not present

## 2016-06-30 DIAGNOSIS — Z87891 Personal history of nicotine dependence: Secondary | ICD-10-CM | POA: Insufficient documentation

## 2016-06-30 DIAGNOSIS — M545 Low back pain: Secondary | ICD-10-CM | POA: Diagnosis present

## 2016-06-30 MED ORDER — KETOROLAC TROMETHAMINE 60 MG/2ML IM SOLN
60.0000 mg | Freq: Once | INTRAMUSCULAR | Status: AC
  Administered 2016-06-30: 60 mg via INTRAMUSCULAR
  Filled 2016-06-30: qty 2

## 2016-06-30 MED ORDER — NAPROXEN 375 MG PO TABS: 375.0000 mg | ORAL_TABLET | Freq: Two times a day (BID) | ORAL | 0 refills | Status: DC

## 2016-06-30 MED ORDER — METHOCARBAMOL 500 MG PO TABS: 500.0000 mg | ORAL_TABLET | Freq: Three times a day (TID) | ORAL | 0 refills | Status: DC | PRN

## 2016-06-30 NOTE — Discharge Instructions (Signed)
Medications: Naprosyn, Robaxin  Treatment: Take Naprosyn twice daily as prescribed. Take Robaxin every 8 hours as needed for muscle pain and spasm. Do not drive or operate machinery when taking this medication. Use a heating pad on the area 3-4 times daily alternating 20 min, 20 min off. Attempt the exercises and stretches below 1-2 times daily as tolerated.  Follow-up: Please follow up with your doctor if your symptoms are not improving over the next week. Please return to the emergency department if you develop any new or worsening symptoms.

## 2016-06-30 NOTE — ED Triage Notes (Signed)
Pt c/o L lower back pain and spasms x 2 days.  Pain score 5/10 increasing w/ movement.  Pt reports that she pulled a muscle while lifting heavy boxes.  Pt reports taking cyclobenzaprine w/o relief.

## 2016-06-30 NOTE — ED Provider Notes (Signed)
[facility] DEPT Provider Note   CSN: [id number] Arrival date & time: 06/30/16  1155  By signing my name below, I, Reola Mosher, attest that this documentation has been prepared under the direction and in the presence of Eliezer Mccoy, PA-C.  Electronically Signed: Reola Mosher, ED Scribe. 06/30/16. 12:22 PM.  History   Chief Complaint Chief Complaint  Patient presents with  . Back Pain  . Spasms   The history is provided by the patient. No language interpreter was used.    HPI Comments: [name] is a 65 y.o. female who presents to the Emergency Department complaining of persistent left-sided, lower back pain onset ~2 days ago. She describes her pain as spasmodic and tight in nature. Pt reports that two days ago she was lifting a set of boxes, and her pain has been present since. Pain is worse with movement and certain movements cause spasm.No trauma to the back otherwise. Pt has been taking Flexeril (last dose 6 hours ago) with minimal relief of her pain. Bending laterally or backwards towards the left-side will exacerbated her pain. No h/o cancer or IVDA. No recent or prior surgeries involving the back. She denies saddle anaesthesia/paraesthesias, focal weakness/numbness, bowel/bladder incontinence, neck pain, chills, fever, significant weight loss, chest pain, shortness of breath, abdominal pain, nausea, vomiting, urgency, frequency, hematuria, dysuria, difficulty urinating, or any other associated symptoms.   Past Medical History:  Diagnosis Date  . GERD (gastroesophageal reflux disease)    occasional; OTC as needed  . 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  . Hypertrophic cardiomyopathy ([place])   . Palpitations   . Trigger finger of left hand 09/2012   left long finger   Patient Active Problem List   Diagnosis Date Noted  . Foot injury 03/04/2016  . RUQ pain 09/21/2015  . Advance care planning 06/07/2015  . Achilles tendonitis 09/15/2013    . Balance disorder 03/19/2013  . Pulmonary nodule 03/19/2013  . Cough 08/25/2012  . Trigger finger, acquired 08/25/2012  . Skin lesion 07/10/2012  . Trochanteric bursitis 03/02/2012  . Foot pain 02/04/2012  . History of hepatitis C 05/29/2011  . Abdominal pain, other specified site 05/27/2011  . Mitral and aortic regurgitation 01/24/2011  . Medicare annual wellness visit, subsequent 01/12/2011  . LVH (left ventricular hypertrophy) 05/03/2009  . DIVERTICULITIS OF COLON 03/02/2009  . External hemorrhoids 10/24/2008  . HYPERCHOLESTEROLEMIA 10/07/2006  . Essential hypertension 12/06/2005  . GLUCOSE INTOLERANCE, HX OF 12/06/2005   Past Surgical History:  Procedure Laterality Date  . BREAST REDUCTION SURGERY Bilateral 2001  . CHOLECYSTECTOMY  07/17/2011   Procedure: LAPAROSCOPIC CHOLECYSTECTOMY WITH INTRAOPERATIVE CHOLANGIOGRAM;  Surgeon: Haywood Lasso, MD;  Location: [facility];  Service: General;  Laterality: N/A;  . FOOT GANGLION EXCISION Right   . [place]  . LACERATION REPAIR Right 1996   thumb  . MYOMECTOMY  10/09/99   x 2  . OOPHORECTOMY Right 1980  . OOPHORECTOMY Left 2000  . OVARIAN CYST DRAINAGE  1980  . PARTIAL HYSTERECTOMY  2002   with no remaining ovary  . TRIGGER FINGER RELEASE Left 09/28/2012   Procedure: RELEASE TRIGGER FINGER/A-1 PULLEY;  Surgeon: Jolyn Nap, MD;  Location: [facility];  Service: Orthopedics;  Laterality: Left;   OB History    No data available     Home Medications    Prior to Admission medications   Medication Sig Start Date End Date Taking? Authorizing Provider  hydrocortisone (  ANUSOL-HC) 25 MG suppository Place 1 suppository (25 mg total) rectally 2 (two) times daily as needed for hemorrhoids or itching. For hemorrhoids. 06/21/16   Tonia Ghent, MD  methocarbamol (ROBAXIN) 500 MG tablet Take 1 tablet (500 mg total) by mouth every 8 (eight) hours as needed for muscle spasms. 06/30/16   Frederica Kuster,  PA-C  naproxen (NAPROSYN) 375 MG tablet Take 1 tablet (375 mg total) by mouth 2 (two) times daily. 06/30/16   Frederica Kuster, PA-C  verapamil (CALAN-SR) 240 MG CR tablet Take 1 tablet (240 mg total) by mouth daily. 06/13/16   Tonia Ghent, MD   Family History Family History  Problem Relation Age of Onset  . Colon cancer Father   . Hepatitis C Brother   . Diabetes Sister   . Hypertension Brother   . Hepatitis C Brother   . Colon cancer Paternal Aunt   . Breast cancer Paternal Aunt   . Lung cancer Paternal Uncle   . Breast cancer Cousin   . Liver cancer Cousin   . Colon cancer      Aunt   Social History Social History  Substance Use Topics  . Smoking status: Former Smoker    Packs/day: 2.50    Years: 40.00    Types: Cigarettes    Quit date: 07/07/2005  . Smokeless tobacco: Never Used     Comment: quit smoking 2008  . Alcohol use 0.0 oz/week     Comment: a few glasses a wine a week   Allergies   Morphine sulfate; Penicillins; and Acyclovir and related  Review of Systems Review of Systems  Constitutional: Negative for chills, fever and unexpected weight change.  HENT: Negative for facial swelling.   Respiratory: Negative for shortness of breath.   Cardiovascular: Negative for chest pain.  Gastrointestinal: Negative for abdominal pain, nausea and vomiting.  Genitourinary: Negative for difficulty urinating, dysuria, frequency, hematuria and urgency.  Musculoskeletal: Positive for back pain (left-sided, lower) and myalgias.  Neurological: Negative for weakness and numbness.       Negative for saddle anaesthesia/paraesthesias. Negative for bowel/bladder incontinence.   Psychiatric/Behavioral: The patient is not nervous/anxious.    Physical Exam Updated Vital Signs BP 132/81 (BP Location: Left Arm)   Pulse 71   Temp 97.9 F (36.6 C) (Oral)   Resp 14   Ht 5\' 5"  (1.651 m)   Wt 85.3 kg   SpO2 96%   BMI 31.28 kg/m   Physical Exam  Constitutional: She appears  well-developed and well-nourished. No distress.  HENT:  Head: Normocephalic and atraumatic.  Mouth/Throat: Oropharynx is clear and moist. No oropharyngeal exudate.  Eyes: Conjunctivae are normal. Pupils are equal, round, and reactive to light. Right eye exhibits no discharge. Left eye exhibits no discharge. No scleral icterus.  Neck: Normal range of motion. Neck supple. No thyromegaly present.  Cardiovascular: Normal rate, regular rhythm, normal heart sounds and intact distal pulses.  Exam reveals no gallop and no friction rub.   No murmur heard. Pulmonary/Chest: Effort normal and breath sounds normal. No stridor. No respiratory distress. She has no wheezes. She has no rales.  Abdominal: Soft. Bowel sounds are normal. She exhibits no distension. There is no tenderness. There is no rebound and no guarding.  Musculoskeletal: She exhibits no edema.  No tenderness over the cervical, thoracic, lumbar midline spine.  No significant left paraspinal tenderness, but possible spasm to left thoracic paraspinal  Lymphadenopathy:    She has no cervical adenopathy.  Neurological: She  is alert. Coordination normal.  Normal sensation throughout; 5/5 strength to lower extremities.   Skin: Skin is warm and dry. No rash noted. She is not diaphoretic. No pallor.  Psychiatric: She has a normal mood and affect.  Nursing note and vitals reviewed.  ED Treatments / Results  DIAGNOSTIC STUDIES: Oxygen Saturation is 97% on RA, normal by my interpretation.   COORDINATION OF CARE: 12:19 PM-Discussed next steps with pt. Pt verbalized understanding and is agreeable with the plan.   Radiology No results found.  Procedures Procedures   Medications Ordered in ED Medications  ketorolac (TORADOL) injection 60 mg (60 mg Intramuscular Given 06/30/16 1252)    Initial Impression / Assessment and Plan / ED Course  I have reviewed the triage vital signs and the nursing notes.  Pertinent labs & imaging results that  were available during my care of the patient were reviewed by me and considered in my medical decision making (see chart for details).  Clinical Course     Patient is a 71yoF who presents to the ED with lumbar back pain. No neurological deficits appreciated. Patient is ambulatory. No warning symptoms of back pain including: fecal incontinence, urinary retention or overflow incontinence, night sweats, waking from sleep with back pain, unexplained fevers or weight loss, h/o cancer, IVDU, recent trauma. No concern for cauda equina, epidural abscess, or other serious cause of back pain. Will treat with injection of Toradol while in the ED. Conservative measures such as rest, ice/heat and NSAIDs, muscle relaxer indicated with PCP follow-up if no improvement with conservative management. Pt is comfortable with above plan and is stable for discharge at this time. All questions were answered prior to disposition. Strict return precautions for return into the ED were discussed.   Final Clinical Impressions(s) / ED Diagnoses   Final diagnoses:  Muscle spasm   New Prescriptions Discharge Medication List as of 06/30/2016 12:40 PM    START taking these medications   Details  methocarbamol (ROBAXIN) 500 MG tablet Take 1 tablet (500 mg total) by mouth every 8 (eight) hours as needed for muscle spasms., Starting Sun 06/30/2016, Print    naproxen (NAPROSYN) 375 MG tablet Take 1 tablet (375 mg total) by mouth 2 (two) times daily., Starting Sun 06/30/2016, Print       I personally performed the services described in this documentation, which was scribed in my presence. The recorded information has been reviewed and is accurate.     Frederica Kuster, PA-C 06/30/16 1912    Gareth [place], MD 07/01/16 [phone]

## 2016-09-15 ENCOUNTER — Other Ambulatory Visit: Payer: Self-pay | Admitting: Family Medicine

## 2016-09-18 ENCOUNTER — Telehealth: Payer: Self-pay

## 2016-09-18 ENCOUNTER — Other Ambulatory Visit: Payer: Self-pay | Admitting: Family Medicine

## 2016-09-18 NOTE — Telephone Encounter (Signed)
Pt said needs refill verapamil to [facility]. Pt should have refills available at [facility]. I spoke with Lanny Hurst at [facility] and pt has verapamil # 90 x 3 refills on hold. [facility] call [facility] and have refills transferred. Pt voiced understanding.

## 2016-10-14 ENCOUNTER — Other Ambulatory Visit: Payer: Self-pay | Admitting: *Deleted

## 2016-10-14 MED ORDER — VERAPAMIL HCL ER 240 MG PO TBCR
240.0000 mg | EXTENDED_RELEASE_TABLET | Freq: Every day | ORAL | 2 refills | Status: DC
Start: 2016-10-14 — End: 2017-07-02

## 2016-10-14 NOTE — Telephone Encounter (Signed)
Received faxed refill request from [facility] for Verapamil. Spoke to patient by telephone and was advised that she is switching to this mail order pharmacy because she can get her medication free.  Refill sent to pharmacy electronically per patient's request.

## 2016-11-14 ENCOUNTER — Ambulatory Visit: Payer: Medicare Other | Admitting: Family Medicine

## 2016-11-18 ENCOUNTER — Ambulatory Visit: Payer: Medicare HMO | Admitting: Family Medicine

## 2016-12-11 ENCOUNTER — Ambulatory Visit (INDEPENDENT_AMBULATORY_CARE_PROVIDER_SITE_OTHER): Payer: Medicare HMO | Admitting: Family Medicine

## 2016-12-11 ENCOUNTER — Encounter: Payer: Self-pay | Admitting: Family Medicine

## 2016-12-11 DIAGNOSIS — W57XXXA Bitten or stung by nonvenomous insect and other nonvenomous arthropods, initial encounter: Secondary | ICD-10-CM | POA: Diagnosis not present

## 2016-12-11 DIAGNOSIS — R109 Unspecified abdominal pain: Secondary | ICD-10-CM | POA: Diagnosis not present

## 2016-12-11 MED ORDER — TRIAMCINOLONE ACETONIDE 0.1 % EX CREA: 1.0000 "application " | TOPICAL_CREAM | Freq: Three times a day (TID) | CUTANEOUS | 1 refills | Status: DC | PRN

## 2016-12-11 MED ORDER — DOXYCYCLINE HYCLATE 100 MG PO TABS: 100.0000 mg | ORAL_TABLET | Freq: Two times a day (BID) | ORAL | 0 refills | Status: DC

## 2016-12-11 NOTE — Patient Instructions (Signed)
TAC cream if needed for itching.  If any fever or other rash, then start doxycycline and update [place].  Take care.  Glad to see you.

## 2016-12-11 NOTE — Progress Notes (Signed)
Tick bite.  Was present for about 4 days.  Itchy locally. Tick was fully removed. No FCNAVD.  No new skin lesions. She does have an old lesion on the left lower back that she wanted evaluated.  She had some jaw pain that improved with using a night guard.    She had some R sided abd pain.  Dull ache.  Resolved in the meantime.  She had previous imaging was unremarkable. Discussed with patient. No symptoms currently.  Meds, vitals, and allergies reviewed.   ROS: Per HPI unless specifically indicated in ROS section   nad ncat rrr ctab abd soft, not ttp SK noted on the L lower back.  Benign appearing. Right popliteal area with tick bite site that appears to be locally irritated (likely partly from repeated scratching) but doesn't appear infected.  No erythema. No discharge.

## 2016-12-12 DIAGNOSIS — W57XXXA Bitten or stung by nonvenomous insect and other nonvenomous arthropods, initial encounter: Secondary | ICD-10-CM | POA: Insufficient documentation

## 2016-12-12 NOTE — Assessment & Plan Note (Addendum)
Now resolved. Update me as needed, she agrees.

## 2016-12-12 NOTE — Assessment & Plan Note (Signed)
No symptoms other than local irritation and itching. Okay to use triamcinolone cream locally. Does not appear infected. No signs of systemic tick related illness. Discussed with patient about routine cautions, hold doxycycline prescription for now. If any fevers chills or other rash update [place] and start doxycycline. Routine cautions given. She agrees.  Reassured about seborrheic keratosis lower back. This is incidental and does not need treatment.

## 2016-12-18 ENCOUNTER — Ambulatory Visit: Payer: Self-pay | Admitting: Family Medicine

## 2017-01-28 IMAGING — CT CT CHEST SUPER D W/O CM
3 of 4 series · 16 of 30 positions shown, 18 images · non-contrast
Comparison: None.

CLINICAL DATA: 64-year-old female with history of left lower lobe
lung nodule diagnosed 0004.

EXAM:
CT CHEST WITHOUT CONTRAST
TECHNIQUE: Multidetector CT imaging of the chest was performed using thin slice
collimation for electromagnetic bronchoscopy planning purposes,
without intravenous contrast.

[Series 3: chest w/o · axial · non-contrast · 0.70mm/px · z∈[-235,-40]mm · 4 of 65 slices shown]
[im 13/65  lung]
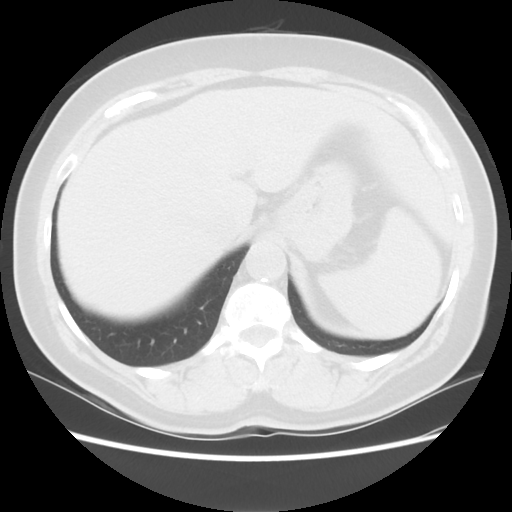
[im 26/65  lung]
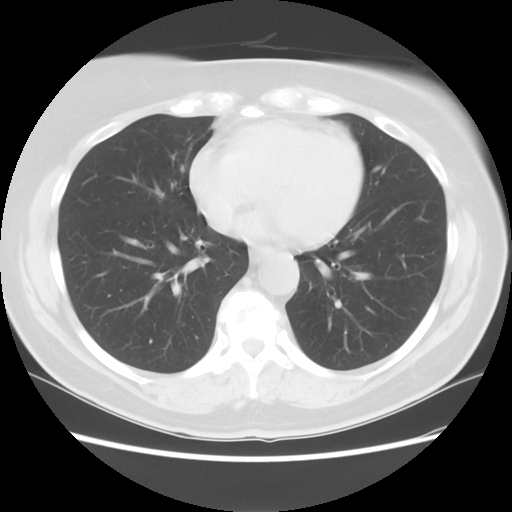
[im 39/65  lung]
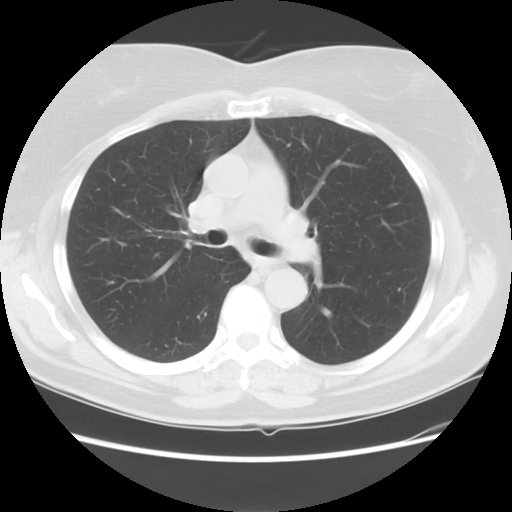
[im 52/65  lung]
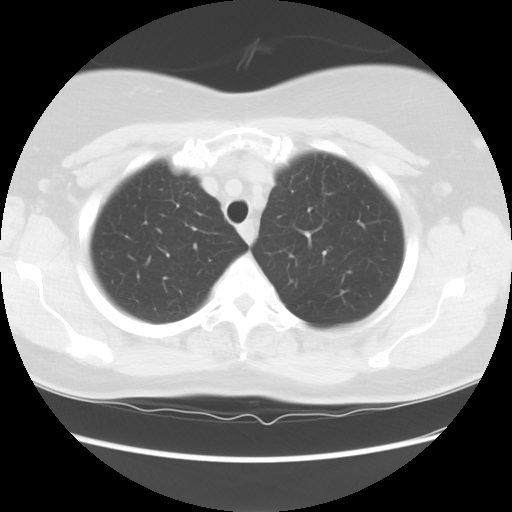

[Series 4: lung windows · axial · 0.70mm/px · z∈[-245,-30]mm · 5 of 65 slices shown, 7 images]
[im 11/65  mediastinal]
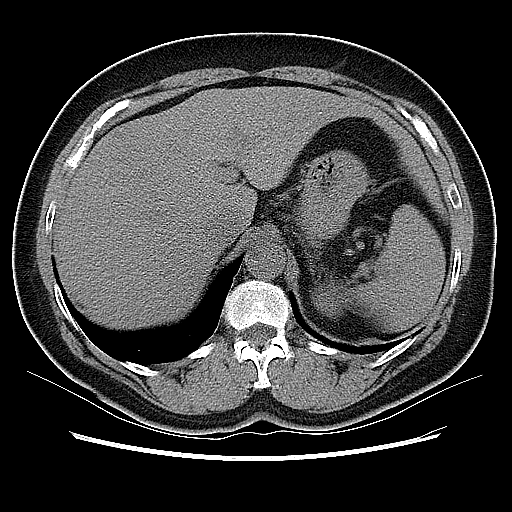
[im 11/65  lung]
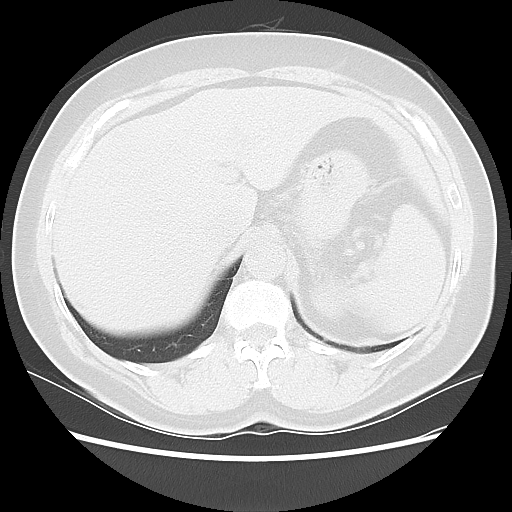
[im 22/65  lung]
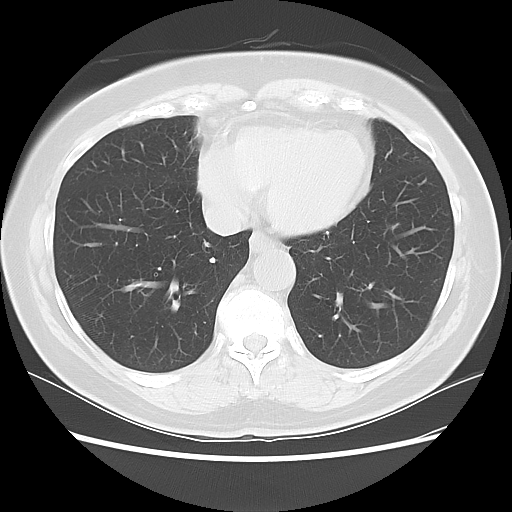
[im 33/65  lung]
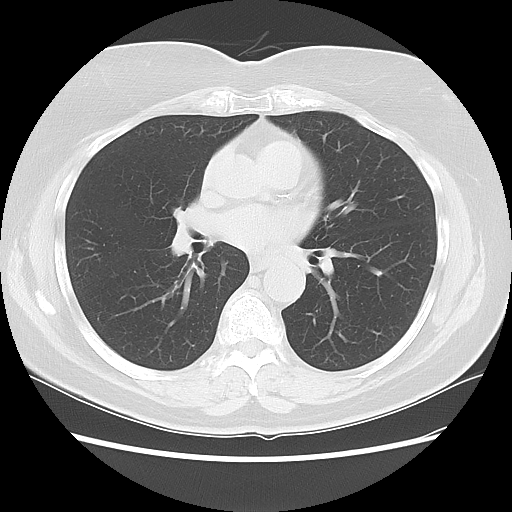
[im 43/65  lung]
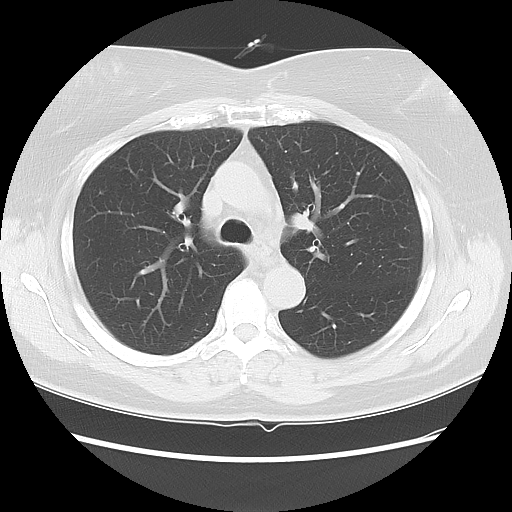
[im 54/65  mediastinal]
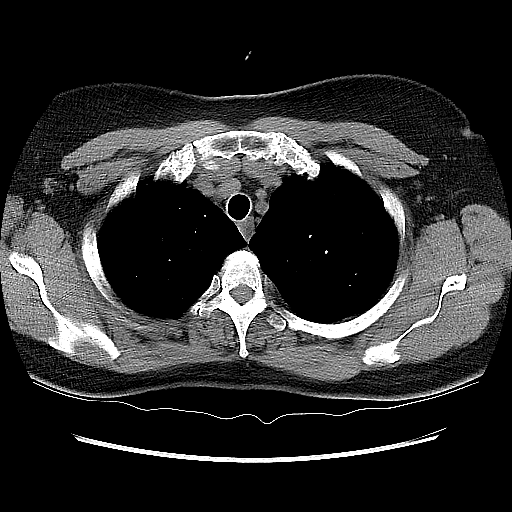
[im 54/65  lung]
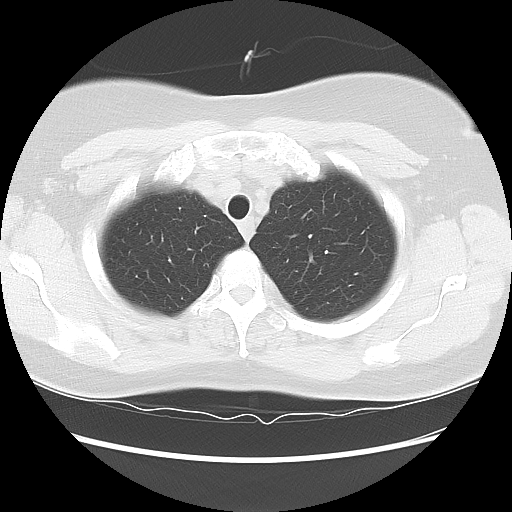

[Series 602: sagittal body · sagittal · 0.70mm/px · 7 of 145 slices shown]
[im 11/145  mediastinal]
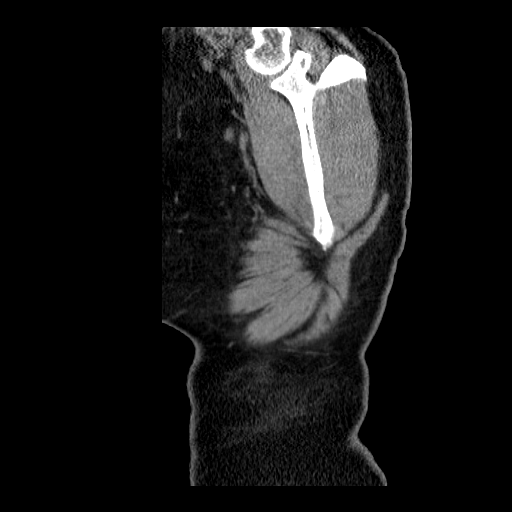
[im 31/145  mediastinal]
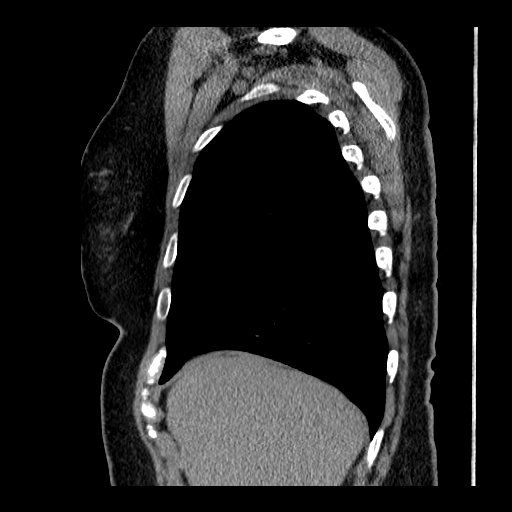
[im 52/145  mediastinal]
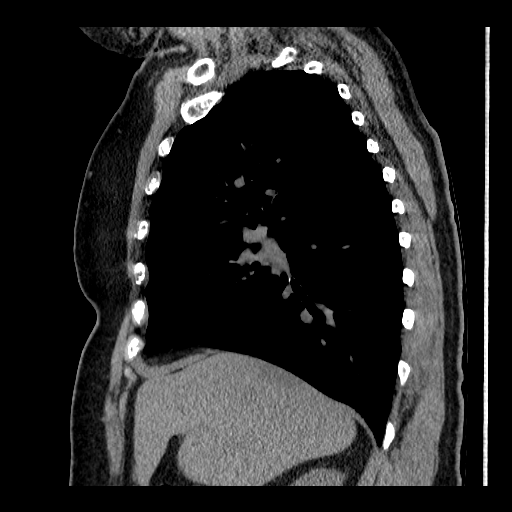
[im 62/145  mediastinal]
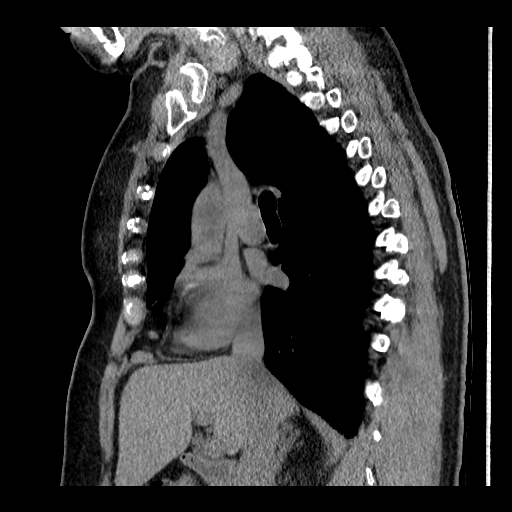
[im 83/145  mediastinal]
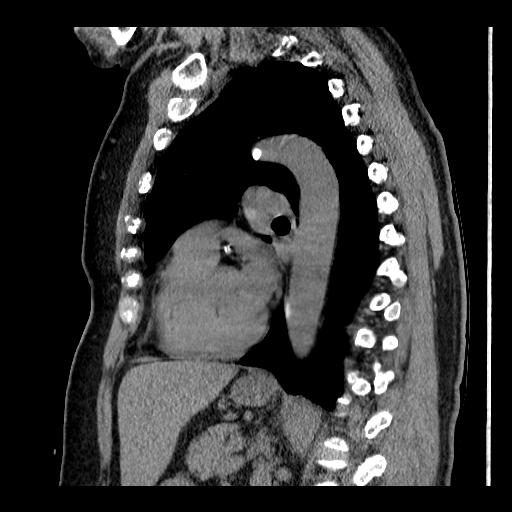
[im 93/145  mediastinal]
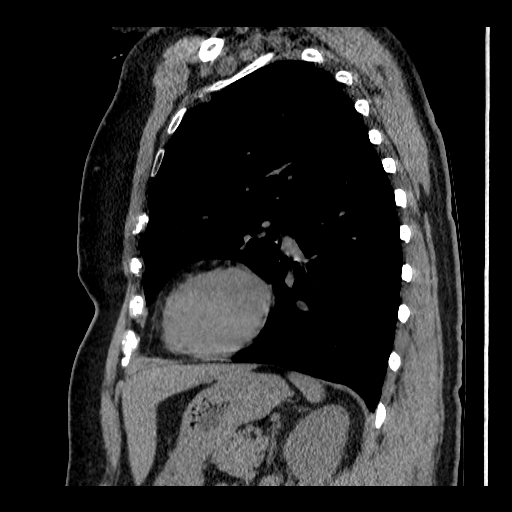
[im 114/145  mediastinal]
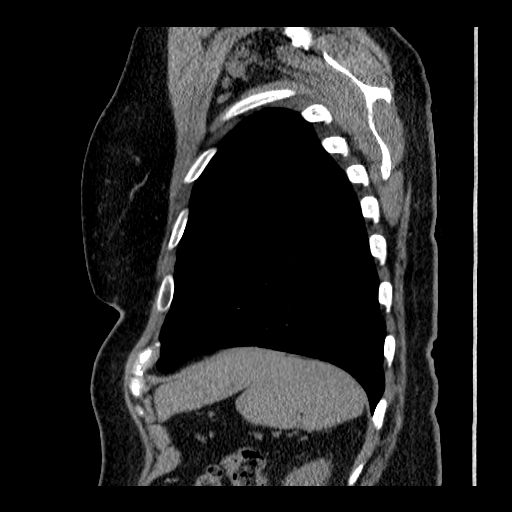

[16 of 30 positions shown; findings below may reference images not displayed]

FINDINGS: Mediastinum/Lymph Nodes: Heart size is normal. There is no
significant pericardial fluid, thickening or pericardial
calcification. There is atherosclerosis of the thoracic aorta, the
great vessels of the mediastinum and the coronary arteries,
including calcified atherosclerotic plaque in the left main, left
anterior descending, left circumflex and right coronary arteries. No
pathologically enlarged mediastinal or hilar lymph nodes. Please
note that accurate exclusion of hilar adenopathy is limited on
noncontrast CT scans. Esophagus is unremarkable in appearance. No
axillary lymphadenopathy.

Lungs/Pleura: Previously noted ground-glass attenuation nodule in
the left lower lobe is far less conspicuous on today's examination
now a barely perceptible 5 mm ground glass attenuation nodule (image
43 of series 4). No other suspicious appearing pulmonary nodules or
masses are noted. No acute consolidative airspace disease. No
pleural effusions.

Upper abdomen: Status post cholecystectomy.

Musculoskeletal: There are no aggressive appearing lytic or blastic
lesions noted in the visualized portions of the skeleton.
IMPRESSION: 1. Interval decrease in size of very subtle 5 mm ground-glass
attenuation nodule in the left lower lobe. This lesion has waxed and
waned in size compared to prior studies dating back to 03/26/2013,
and is favored to be a benign area of recurrent inflammation. One
additional noncontrast chest CT is recommended in 12 months to
ensure greater than 3 years of overall stability.
2. Atherosclerosis, including left main and 3 vessel coronary artery
disease. Please note that although the presence of coronary artery
calcium documents the presence of coronary artery disease, the
severity of this disease and any potential stenosis cannot be
assessed on this non-gated CT examination. Assessment for potential
risk factor modification, dietary therapy or pharmacologic therapy
may be warranted, if clinically indicated.

## 2017-01-29 ENCOUNTER — Other Ambulatory Visit: Payer: Self-pay | Admitting: Family Medicine

## 2017-01-29 DIAGNOSIS — Z1231 Encounter for screening mammogram for malignant neoplasm of breast: Secondary | ICD-10-CM

## 2017-01-29 DIAGNOSIS — M9901 Segmental and somatic dysfunction of cervical region: Secondary | ICD-10-CM | POA: Diagnosis not present

## 2017-01-29 DIAGNOSIS — M9902 Segmental and somatic dysfunction of thoracic region: Secondary | ICD-10-CM | POA: Diagnosis not present

## 2017-01-29 DIAGNOSIS — M5031 Other cervical disc degeneration,  high cervical region: Secondary | ICD-10-CM | POA: Diagnosis not present

## 2017-02-10 DIAGNOSIS — M9902 Segmental and somatic dysfunction of thoracic region: Secondary | ICD-10-CM | POA: Diagnosis not present

## 2017-02-10 DIAGNOSIS — M9901 Segmental and somatic dysfunction of cervical region: Secondary | ICD-10-CM | POA: Diagnosis not present

## 2017-02-10 DIAGNOSIS — M5031 Other cervical disc degeneration,  high cervical region: Secondary | ICD-10-CM | POA: Diagnosis not present

## 2017-02-13 DIAGNOSIS — M5031 Other cervical disc degeneration,  high cervical region: Secondary | ICD-10-CM | POA: Diagnosis not present

## 2017-02-13 DIAGNOSIS — M9902 Segmental and somatic dysfunction of thoracic region: Secondary | ICD-10-CM | POA: Diagnosis not present

## 2017-02-13 DIAGNOSIS — M9901 Segmental and somatic dysfunction of cervical region: Secondary | ICD-10-CM | POA: Diagnosis not present

## 2017-02-18 DIAGNOSIS — M9902 Segmental and somatic dysfunction of thoracic region: Secondary | ICD-10-CM | POA: Diagnosis not present

## 2017-02-18 DIAGNOSIS — M5031 Other cervical disc degeneration,  high cervical region: Secondary | ICD-10-CM | POA: Diagnosis not present

## 2017-02-18 DIAGNOSIS — M9901 Segmental and somatic dysfunction of cervical region: Secondary | ICD-10-CM | POA: Diagnosis not present

## 2017-02-24 ENCOUNTER — Ambulatory Visit
Admission: RE | Admit: 2017-02-24 | Discharge: 2017-02-24 | Disposition: A | Discharge status: Home / Self Care | Payer: Medicare HMO | Source: Ambulatory Visit | Attending: Family Medicine | Admitting: Family Medicine

## 2017-02-24 DIAGNOSIS — Z1231 Encounter for screening mammogram for malignant neoplasm of breast: Secondary | ICD-10-CM | POA: Diagnosis not present

## 2017-02-24 DIAGNOSIS — M9902 Segmental and somatic dysfunction of thoracic region: Secondary | ICD-10-CM | POA: Diagnosis not present

## 2017-02-24 DIAGNOSIS — M9901 Segmental and somatic dysfunction of cervical region: Secondary | ICD-10-CM | POA: Diagnosis not present

## 2017-02-24 DIAGNOSIS — M5031 Other cervical disc degeneration,  high cervical region: Secondary | ICD-10-CM | POA: Diagnosis not present

## 2017-03-24 ENCOUNTER — Encounter: Payer: Self-pay | Admitting: Family Medicine

## 2017-03-24 ENCOUNTER — Ambulatory Visit (INDEPENDENT_AMBULATORY_CARE_PROVIDER_SITE_OTHER): Payer: Medicare HMO | Admitting: Family Medicine

## 2017-03-24 DIAGNOSIS — R109 Unspecified abdominal pain: Secondary | ICD-10-CM

## 2017-03-24 DIAGNOSIS — H6980 Other specified disorders of Eustachian tube, unspecified ear: Secondary | ICD-10-CM

## 2017-03-24 MED ORDER — FLUTICASONE PROPIONATE 50 MCG/ACT NA SUSP: 2.0000 | Freq: Every day | NASAL | 6 refills | Status: DC

## 2017-03-24 NOTE — Patient Instructions (Signed)
Restart flonase.  The other pain should gradually resolve with relative rest.  Likely a benign irritation.  Take care.  Glad to see you.

## 2017-03-24 NOTE — Progress Notes (Signed)
Pain under ribs.  At the xiphoid.  She doesn't have GI sx, burping. no nausea, no vomiting.  No change with eating.  More pain prev with movement, esp twisting.  No other chest wall pain.  No bruising.  No fevers, no chills.    She has some L ear discomfort with some occ discharge.  She feels like she couldn't get her L ear to pop for about 4 days, but that is better now.    She took some spirit gum and the sx with "stomach tapping" finally stopped.   She had a happy birthday recently.    Meds, vitals, and allergies reviewed.   ROS: Per HPI unless specifically indicated in ROS section   GEN: nad, alert and oriented HEENT: mucous membranes moist, ETD noted on exam.   NECK: supple w/o LA CV: rrr. Xiphoid area not ttp PULM: ctab, no inc wob ABD: soft, +bs, no pain on twisting/movement.

## 2017-03-25 NOTE — Assessment & Plan Note (Signed)
Likely upper abd wall strain near xiphoid.  Resolved now, d/w pt.  Update me as needed.

## 2017-03-25 NOTE — Assessment & Plan Note (Signed)
Likely, d/w pt.  Restart flonase.  Update me as needed.

## 2017-05-01 ENCOUNTER — Ambulatory Visit: Payer: Medicare HMO

## 2017-06-20 ENCOUNTER — Other Ambulatory Visit: Payer: Self-pay | Admitting: Acute Care

## 2017-06-20 DIAGNOSIS — Z122 Encounter for screening for malignant neoplasm of respiratory organs: Secondary | ICD-10-CM

## 2017-06-20 DIAGNOSIS — Z87891 Personal history of nicotine dependence: Secondary | ICD-10-CM

## 2017-06-25 ENCOUNTER — Telehealth: Payer: Self-pay | Admitting: Acute Care

## 2017-06-26 NOTE — Telephone Encounter (Signed)
Spoke with pt and rescheduled SDMV to 08/01/17 2:00 CT will be rescheduled Nothing further needed

## 2017-06-26 NOTE — Telephone Encounter (Signed)
Routing to lung nodule pool for follow up.  

## 2017-07-02 ENCOUNTER — Other Ambulatory Visit: Payer: Self-pay | Admitting: Family Medicine

## 2017-07-07 ENCOUNTER — Encounter: Payer: Self-pay | Admitting: Family Medicine

## 2017-07-11 ENCOUNTER — Encounter: Payer: Medicare HMO | Admitting: Acute Care

## 2017-07-11 ENCOUNTER — Ambulatory Visit: Payer: Medicare HMO

## 2017-07-16 ENCOUNTER — Ambulatory Visit: Payer: Medicare HMO

## 2017-07-18 ENCOUNTER — Encounter: Payer: Self-pay | Admitting: Family Medicine

## 2017-07-21 ENCOUNTER — Other Ambulatory Visit: Payer: Self-pay | Admitting: Family Medicine

## 2017-07-21 DIAGNOSIS — E7800 Pure hypercholesterolemia, unspecified: Secondary | ICD-10-CM

## 2017-07-21 DIAGNOSIS — I10 Essential (primary) hypertension: Secondary | ICD-10-CM

## 2017-07-24 ENCOUNTER — Other Ambulatory Visit: Payer: Medicare HMO

## 2017-07-24 ENCOUNTER — Ambulatory Visit: Payer: Medicare HMO

## 2017-07-28 ENCOUNTER — Other Ambulatory Visit: Payer: Medicare HMO

## 2017-07-29 ENCOUNTER — Other Ambulatory Visit (INDEPENDENT_AMBULATORY_CARE_PROVIDER_SITE_OTHER): Payer: Medicare HMO

## 2017-07-29 DIAGNOSIS — I10 Essential (primary) hypertension: Secondary | ICD-10-CM | POA: Diagnosis not present

## 2017-07-29 LAB — COMPREHENSIVE METABOLIC PANEL
ALBUMIN: 4.3 g/dL (ref 3.5–5.2)
ALK PHOS: 56 U/L (ref 39–117)
ALT: 25 U/L (ref 0–35)
AST: 36 U/L (ref 0–37)
BILIRUBIN TOTAL: 0.4 mg/dL (ref 0.2–1.2)
BUN: 10 mg/dL (ref 6–23)
CALCIUM: 9.8 mg/dL (ref 8.4–10.5)
CO2: 29 mEq/L (ref 19–32)
Chloride: 103 mEq/L (ref 96–112)
Creatinine, Ser: 0.6 mg/dL (ref 0.40–1.20)
GFR: 127.71 mL/min (ref >60.00)
Glucose, Bld: 122 mg/dL — ABNORMAL HIGH (ref 70–99)
POTASSIUM: 4.2 meq/L (ref 3.5–5.1)
Sodium: 137 mEq/L (ref 135–145)
TOTAL PROTEIN: 7.7 g/dL (ref 6.0–8.3)

## 2017-07-29 LAB — LIPID PANEL
Cholesterol: 197 mg/dL (ref 0–200)
HDL: 43.3 mg/dL (ref >39.00)
LDL Cholesterol: 135 mg/dL — ABNORMAL HIGH (ref 0–99)
NONHDL: 153.53
TRIGLYCERIDES: 93 mg/dL (ref 0.0–149.0)
Total CHOL/HDL Ratio: 5
VLDL: 18.6 mg/dL (ref 0.0–40.0)

## 2017-07-31 ENCOUNTER — Ambulatory Visit (INDEPENDENT_AMBULATORY_CARE_PROVIDER_SITE_OTHER): Payer: Medicare HMO | Admitting: Family Medicine

## 2017-07-31 ENCOUNTER — Encounter: Payer: Self-pay | Admitting: Family Medicine

## 2017-07-31 VITALS — BP 102/76 | HR 70 | Temp 98.3°F | Ht 65.0 in | Wt 193.5 lb

## 2017-07-31 DIAGNOSIS — I517 Cardiomegaly: Secondary | ICD-10-CM

## 2017-07-31 DIAGNOSIS — Z0000 Encounter for general adult medical examination without abnormal findings: Secondary | ICD-10-CM | POA: Diagnosis not present

## 2017-07-31 DIAGNOSIS — Z7189 Other specified counseling: Secondary | ICD-10-CM

## 2017-07-31 DIAGNOSIS — Z8619 Personal history of other infectious and parasitic diseases: Secondary | ICD-10-CM | POA: Diagnosis not present

## 2017-07-31 DIAGNOSIS — Z23 Encounter for immunization: Secondary | ICD-10-CM | POA: Diagnosis not present

## 2017-07-31 DIAGNOSIS — R739 Hyperglycemia, unspecified: Secondary | ICD-10-CM | POA: Diagnosis not present

## 2017-07-31 NOTE — Patient Instructions (Signed)
Use the eat right diet.   Keep exercising and recheck fasting labs in about 6 months at a lab visit.  Update me as needed.  Take care.  Glad to see you.

## 2017-07-31 NOTE — Progress Notes (Signed)
I have personally reviewed the Medicare Annual Wellness questionnaire and have noted 1. The patient's medical and social history 2. Their use of alcohol, tobacco or illicit drugs 3. Their current medications and supplements 4. The patient's functional ability including ADL's, fall risks, home safety risks and hearing or visual             impairment. 5. Diet and physical activities 6. Evidence for depression or mood disorders  The patients weight, height, BMI have been recorded in the chart and visual acuity is per eye clinic.  I have made referrals, counseling and provided education to the patient based review of the above and I have provided the pt with a written personalized care plan for preventive services.  Provider list updated- see scanned forms.  Routine anticipatory guidance given to patient.  See health maintenance. The possibility exists that previously documented standard health maintenance information may have been brought forward from a previous encounter into this note.  If needed, that same information has been updated to reflect the current situation based on today's encounter.    Flu 2019 Shingles 2014 PNA 2016 Tetanus 09/2014, done out of clinic.  Colonoscopy 2016 Breast cancer screening 2016 DXA declined, d/w pt.  Advance directive- son Orvan July if patient were incapacitated.  Cognitive function addressed- see scanned forms- and if abnormal then additional documentation follows.   LVH.  No CP, BLE edema.  She is getting back to exercising.  She had one episode of SOB with stairs but that may have been from relative deconditioning.  Still on CCB at baseline with cards f/u pending.    She has some L hand pain that she though was due to arthritis or tendonitis.  No trauma.  Only noted this AM.    Hyperglycemia d/w pt.  Handout given and discussed.  Will recheck labs in about 6 months.    Some occ AM indigestion vs hunger in the early AM.  Vinegar the night  before helps with GERD sx.  No blood in stool, no vomiting.    H/o HCV.  Prev treated.  LFTs wnl.  D/w pt.    PMH and SH reviewed  Meds, vitals, and allergies reviewed.   ROS: Per HPI.  Unless specifically indicated otherwise in HPI, the patient denies:  General: fever. Eyes: acute vision changes ENT: sore throat Cardiovascular: chest pain Respiratory: SOB GI: vomiting GU: dysuria Musculoskeletal: acute back pain Derm: acute rash Neuro: acute motor dysfunction Psych: worsening mood Endocrine: polydipsia Heme: bleeding Allergy: hayfever  GEN: nad, alert and oriented HEENT: mucous membranes moist NECK: supple w/o LA CV: rrr. PULM: ctab, no inc wob ABD: soft, +bs EXT: no edema SKIN: no acute rash

## 2017-07-31 NOTE — Assessment & Plan Note (Signed)
She is getting back to exercising.  No ADE on med. Labs d/w pt.  No CP.  No BLE edema.  Continue as is.  Continue CCB.  Has cards f/u pending.

## 2017-07-31 NOTE — Assessment & Plan Note (Signed)
Use the eat right diet, d/w pt.  Handout given to patient.   Keep exercising and recheck fasting labs in about 6 months at a lab visit.  She agrees.

## 2017-07-31 NOTE — Assessment & Plan Note (Signed)
Flu 2019 Shingles 2014 PNA 2016 Tetanus 09/2014, done out of clinic.  Colonoscopy 2016 Breast cancer screening 2016 DXA declined, d/w pt.  Advance directive- son Orvan July if patient were incapacitated.  Cognitive function addressed- see scanned forms- and if abnormal then additional documentation follows.

## 2017-07-31 NOTE — Assessment & Plan Note (Signed)
Advance directive- son Maurice designated if patient were incapacitated.   

## 2017-08-01 ENCOUNTER — Inpatient Hospital Stay: Admission: RE | Admit: 2017-08-01 | Payer: Medicare HMO | Source: Ambulatory Visit

## 2017-08-01 ENCOUNTER — Encounter: Payer: Medicare HMO | Admitting: Acute Care

## 2017-08-01 NOTE — Assessment & Plan Note (Signed)
Prev treated.  LFTs wnl.  D/w pt.

## 2017-08-04 ENCOUNTER — Encounter: Payer: Self-pay | Admitting: Acute Care

## 2017-08-04 ENCOUNTER — Ambulatory Visit (INDEPENDENT_AMBULATORY_CARE_PROVIDER_SITE_OTHER)
Admission: RE | Admit: 2017-08-04 | Discharge: 2017-08-04 | Disposition: A | Discharge status: Home / Self Care | Payer: Medicare HMO | Source: Ambulatory Visit | Attending: Acute Care | Admitting: Acute Care

## 2017-08-04 ENCOUNTER — Ambulatory Visit (INDEPENDENT_AMBULATORY_CARE_PROVIDER_SITE_OTHER): Payer: Medicare HMO | Admitting: Acute Care

## 2017-08-04 DIAGNOSIS — Z87891 Personal history of nicotine dependence: Secondary | ICD-10-CM

## 2017-08-04 DIAGNOSIS — Z122 Encounter for screening for malignant neoplasm of respiratory organs: Secondary | ICD-10-CM

## 2017-08-04 NOTE — Progress Notes (Signed)
Shared Decision Making Visit Lung Cancer Screening Program [phone])   Eligibility:  Age 67 y.o.  Pack Years Smoking History Calculation 112 pack year smoking history (# packs/per year x # years smoked)  Recent History of coughing up blood  no  Unexplained weight loss? no ( >Than 15 pounds within the last 6 months )  Prior History Lung / other cancer no (Diagnosis within the last 5 years already requiring surveillance chest CT Scans).  Smoking Status Former Smoker  Former Smokers: Years since quit: 12 years  Quit Date: 07/07/2005  Visit Components:  Discussion included one or more decision making aids. yes  Discussion included risk/benefits of screening. yes  Discussion included potential follow up diagnostic testing for abnormal scans. yes  Discussion included meaning and risk of over diagnosis. yes  Discussion included meaning and risk of False Positives. yes  Discussion included meaning of total radiation exposure. yes  Counseling Included:  Importance of adherence to annual lung cancer LDCT screening. yes  Impact of comorbidities on ability to participate in the program. yes  Ability and willingness to under diagnostic treatment. yes  Smoking Cessation Counseling:  Current Smokers:   Discussed importance of smoking cessation. yes  Information about tobacco cessation classes and interventions provided to patient. yes  Patient provided with "ticket" for LDCT Scan. yes  Symptomatic Patient. no  CounselingNA  Diagnosis Code: Tobacco Use Z72.0  Asymptomatic Patient yes  Counseling (Intermediate counseling: > three minutes counseling) H0865  Former Smokers:   Discussed the importance of maintaining cigarette abstinence. yes  Diagnosis Code: Personal History of Nicotine Dependence. H84.696  Information about tobacco cessation classes and interventions provided to patient. Yes  Patient provided with "ticket" for LDCT Scan. yes  Written Order for  Lung Cancer Screening with LDCT placed in Epic. Yes (CT Chest Lung Cancer Screening Low Dose W/O CM) [id number] Z12.2-Screening of respiratory organs Z87.891-Personal history of nicotine dependence  I spent 25 minutes of face to face time with Ms. Blauvelt discussing the risks and benefits of lung cancer screening. We viewed a power point together that explained in detail the above noted topics. We took the time to pause the power point at intervals to allow for questions to be asked and answered to ensure understanding. We discussed that she had taken the single most powerful action possible to decrease her risk of developing lung cancer when she quit smoking. I counseled her  to remain smoke free, and to contact me if she ever had the desire to smoke again so that I can provide resources and tools to help support the effort to remain smoke free. We discussed the time and location of the scan, and that either  Doroteo Glassman RN or I will call with the results within  24-48 hours of receiving them. She  has my card and contact information in the event she needs to speak with me, in addition to a copy of the power point we reviewed as a resource. Ms. Matsuoka  verbalized understanding of all of the above and had no further questions upon leaving the office.     I explained to the patient that there has been a high incidence of coronary artery disease noted on these exams. I explained that this is a non-gated exam therefore degree or severity cannot be determined. This patient is not on statin therapy per the Epic MAR.. I have asked the patient to follow-up with their PCP regarding any incidental finding of coronary artery disease and management with  diet or medication as they feel is clinically indicated. The patient verbalized understanding of the above and had no further questions.     Magdalen Spatz, NP 08/04/2017

## 2017-08-08 ENCOUNTER — Other Ambulatory Visit: Payer: Self-pay | Admitting: Acute Care

## 2017-08-08 DIAGNOSIS — Z122 Encounter for screening for malignant neoplasm of respiratory organs: Secondary | ICD-10-CM

## 2017-08-08 DIAGNOSIS — Z87891 Personal history of nicotine dependence: Secondary | ICD-10-CM

## 2017-08-10 ENCOUNTER — Telehealth: Payer: Self-pay | Admitting: Family Medicine

## 2017-08-10 NOTE — Progress Notes (Signed)
See phone note

## 2017-08-10 NOTE — Telephone Encounter (Signed)
She has CAD on CT screening of her chest.  She isn't on a statin.  What is your preference on statin use in this patient?  She has f/u with you pending for 09/2017.  She is on baseline verapamil with no chest pain.  Thanks.

## 2017-08-13 NOTE — Telephone Encounter (Signed)
Hi,  Given an LDL of 135 I would start with Lipitor 40 mg.  Goal LDL less than 70.  Thanks.

## 2017-08-14 NOTE — Telephone Encounter (Signed)
Patient does not want to start any medication at this time.  Patient advised that if she changes her mind, let us know.

## 2017-08-14 NOTE — Telephone Encounter (Signed)
Noted. I'll defer.  

## 2017-08-14 NOTE — Telephone Encounter (Signed)
Notify pt.  I talked to cards. Given an LDL of 135 I would start with Lipitor 40 mg.  Goal LDL less than 70.  This is reasonable.  If she will give it a try, I'll send it in and put in the orders for f/u labs.  She would need f/u fasting lipids in about 2 months.  Thanks.  Let me know.

## 2017-08-25 DIAGNOSIS — H524 Presbyopia: Secondary | ICD-10-CM | POA: Diagnosis not present

## 2017-08-28 DIAGNOSIS — R079 Chest pain, unspecified: Secondary | ICD-10-CM | POA: Diagnosis not present

## 2017-08-28 DIAGNOSIS — N390 Urinary tract infection, site not specified: Secondary | ICD-10-CM | POA: Diagnosis not present

## 2017-08-28 DIAGNOSIS — R0602 Shortness of breath: Secondary | ICD-10-CM | POA: Diagnosis not present

## 2017-08-28 DIAGNOSIS — I422 Other hypertrophic cardiomyopathy: Secondary | ICD-10-CM | POA: Diagnosis not present

## 2017-08-28 DIAGNOSIS — Z880 Allergy status to penicillin: Secondary | ICD-10-CM | POA: Diagnosis not present

## 2017-08-28 DIAGNOSIS — Z87891 Personal history of nicotine dependence: Secondary | ICD-10-CM | POA: Diagnosis not present

## 2017-08-28 DIAGNOSIS — R0789 Other chest pain: Secondary | ICD-10-CM | POA: Diagnosis not present

## 2017-08-28 DIAGNOSIS — Z886 Allergy status to analgesic agent status: Secondary | ICD-10-CM | POA: Diagnosis not present

## 2017-09-19 ENCOUNTER — Encounter: Payer: Self-pay | Admitting: Cardiology

## 2017-09-27 NOTE — Progress Notes (Addendum)
HPI The patient presents for followup of mild ventricular hypertrophy. She's also had palpitations. An echo in 2010 suggested moderate mitral regurgitation directed posteriorly. There was LVH. This was severe. She is also had some aortic insufficiency. However followup echocardiography in 2013 suggested only mild mitral regurgitation and no significant ventricular hypertrophy or aortic insufficiency.  The last echo in 2017 demonstrated mild LVH.    Since I last saw her she has done well.   The patient denies any new symptoms such as chest discomfort, neck or arm discomfort. There has been no new shortness of breath, PND or orthopnea. There have been no reported palpitations, presyncope or syncope.  Her cholesterol was not at target recently as below and she has started a plant based diet and exercise.    Of note the patient did have some atypical chest discomfort apparently a few months ago and was seen at [facility] emergency room.  However, she related this to drinking caffeine which she does not normally use.  She was evaluated and discharged from the emergency room I had none of these records.   Allergies  Allergen Reactions  . Morphine Sulfate Swelling  . Penicillins Swelling  . Acyclovir And Related     Current Outpatient Medications  Medication Sig Dispense Refill  . fluticasone (FLONASE) 50 MCG/ACT nasal spray Place 2 sprays into both nostrils daily. 16 g 6  . hydrocortisone (ANUSOL-HC) 25 MG suppository Place 1 suppository (25 mg total) rectally 2 (two) times daily as needed. For hemorrhoids. 12 suppository 1  . methocarbamol (ROBAXIN) 500 MG tablet Take 1 tablet (500 mg total) by mouth every 8 (eight) hours as needed for muscle spasms. 20 tablet 0  . naproxen (NAPROSYN) 375 MG tablet Take 1 tablet (375 mg total) by mouth 2 (two) times daily. 20 tablet 0  . triamcinolone cream (KENALOG) 0.1 % Apply 1 application topically 3 (three) times daily as needed. 30 g 1  . verapamil  (CALAN-SR) 240 MG CR tablet TAKE 1 TABLET (240 MG TOTAL) BY MOUTH DAILY. 90 tablet 2   No current facility-administered medications for this visit.     Past Medical History:  Diagnosis Date  . GERD (gastroesophageal reflux disease)    occasional; OTC as needed  . 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  . Hypertrophic cardiomyopathy ([place])   . Palpitations   . Trigger finger of left hand 09/2012   left long finger    Past Surgical History:  Procedure Laterality Date  . BREAST REDUCTION SURGERY Bilateral 2001  . CHOLECYSTECTOMY  07/17/2011   Procedure: LAPAROSCOPIC CHOLECYSTECTOMY WITH INTRAOPERATIVE CHOLANGIOGRAM;  Surgeon: Haywood Lasso, MD;  Location: [place];  Service: General;  Laterality: N/A;  . FOOT GANGLION EXCISION Right   . [place]  . LACERATION REPAIR Right 1996   thumb  . MYOMECTOMY  10/09/99   x 2  . OOPHORECTOMY Right 1980  . OOPHORECTOMY Left 2000  . OVARIAN CYST DRAINAGE  1980  . PARTIAL HYSTERECTOMY  2002   with no remaining ovary  . TRIGGER FINGER RELEASE Left 09/28/2012   Procedure: RELEASE TRIGGER FINGER/A-1 PULLEY;  Surgeon: Jolyn Nap, MD;  Location: [place];  Service: Orthopedics;  Laterality: Left;    ROS:    As stated in the HPI and negative for all other systems.    PHYSICAL EXAM BP (!) 142/85   Pulse 69   Ht 5\' 5"  (1.651 m)   Wt  192 lb 6.4 oz (87.3 kg)   BMI 32.02 kg/m   GENERAL:  Well appearing NECK:  No jugular venous distention, waveform within normal limits, carotid upstroke brisk and symmetric, no bruits, no thyromegaly LUNGS:  Clear to auscultation bilaterally CHEST:  Unremarkable HEART:  PMI not displaced or sustained,S1 and S2 within normal limits, no S3, no S4, no clicks, no rubs, no murmurs ABD:  Flat, positive bowel sounds normal in frequency in pitch, no bruits, no rebound, no guarding, no midline pulsatile mass, no hepatomegaly, no splenomegaly EXT:  2 plus pulses throughout, no  edema, no cyanosis no clubbing   EKG:  Sinus rhythm, rate 69, axis within normal limits, intervals within normal limits, mild lateral T-wave inversions progress previous  No change from previous. . 09/30/2017   ASSESSMENT AND PLAN  LVH:   This was mild.  No change in therapy.  BP is slightly high but this is unusual.  No change in therapy.   HTN:  BP controlled as above.  No change in therapy.   PALPITATIONS:  She no longer feels these.   DYSLIPIDEMIA:  Her LDL was 135.  She will keep an eye on this and if after 6 months of diet and exercise is not improved we might consider a statin.  AORTIC ATHEROSCLEROSIS:  This was noted on Saturday that she gets to follow-up pulmonary nodules.    She will continue with risk reduction.

## 2017-09-30 ENCOUNTER — Encounter: Payer: Self-pay | Admitting: Cardiology

## 2017-09-30 ENCOUNTER — Ambulatory Visit: Payer: Medicare HMO | Admitting: Cardiology

## 2017-09-30 VITALS — BP 142/85 | HR 69 | Ht 65.0 in | Wt 192.4 lb

## 2017-09-30 DIAGNOSIS — I10 Essential (primary) hypertension: Secondary | ICD-10-CM | POA: Diagnosis not present

## 2017-09-30 DIAGNOSIS — E785 Hyperlipidemia, unspecified: Secondary | ICD-10-CM | POA: Insufficient documentation

## 2017-09-30 DIAGNOSIS — R002 Palpitations: Secondary | ICD-10-CM | POA: Diagnosis not present

## 2017-09-30 DIAGNOSIS — I700 Atherosclerosis of aorta: Secondary | ICD-10-CM | POA: Diagnosis not present

## 2017-09-30 MED ORDER — HYDROCORTISONE ACETATE 25 MG RE SUPP: 25.0000 mg | Freq: Two times a day (BID) | RECTAL | 1 refills | Status: DC | PRN

## 2017-09-30 NOTE — Patient Instructions (Signed)
Medication Instructions: Your physician recommends that you continue on your current medications as directed. Please refer to the Current Medication list given to you today.  If you need a refill on your cardiac medications before your next appointment, please call your pharmacy.    Follow-Up: Your physician wants you to follow-up in: 18 months with Dr. Percival Spanish. You will receive a reminder letter in the mail two months in advance. If you don't receive a letter, please call our office at [phone] to schedule this follow-up appointment.   Thank you for choosing [facility] at [facility]!!

## 2017-11-27 ENCOUNTER — Ambulatory Visit (INDEPENDENT_AMBULATORY_CARE_PROVIDER_SITE_OTHER): Payer: Medicare HMO | Admitting: Family Medicine

## 2017-11-27 ENCOUNTER — Encounter: Payer: Self-pay | Admitting: Family Medicine

## 2017-11-27 VITALS — BP 106/74 | HR 65 | Temp 97.9°F | Ht 66.0 in | Wt 188.2 lb

## 2017-11-27 DIAGNOSIS — R1032 Left lower quadrant pain: Secondary | ICD-10-CM | POA: Insufficient documentation

## 2017-11-27 DIAGNOSIS — M542 Cervicalgia: Secondary | ICD-10-CM | POA: Diagnosis not present

## 2017-11-27 LAB — COMPREHENSIVE METABOLIC PANEL
ALBUMIN: 4.3 g/dL (ref 3.5–5.2)
ALT: 13 U/L (ref 0–35)
AST: 15 U/L (ref 0–37)
Alkaline Phosphatase: 54 U/L (ref 39–117)
BUN: 7 mg/dL (ref 6–23)
CALCIUM: 10.2 mg/dL (ref 8.4–10.5)
CHLORIDE: 102 meq/L (ref 96–112)
CO2: 31 meq/L (ref 19–32)
Creatinine, Ser: 0.67 mg/dL (ref 0.40–1.20)
GFR: 112.33 mL/min (ref >60.00)
Glucose, Bld: 94 mg/dL (ref 70–99)
Potassium: 4 mEq/L (ref 3.5–5.1)
Sodium: 138 mEq/L (ref 135–145)
Total Bilirubin: 0.4 mg/dL (ref 0.2–1.2)
Total Protein: 8.2 g/dL (ref 6.0–8.3)

## 2017-11-27 LAB — POC URINALSYSI DIPSTICK (AUTOMATED)
BILIRUBIN UA: NEGATIVE
Blood, UA: NEGATIVE
GLUCOSE UA: NEGATIVE
KETONES UA: NEGATIVE
Leukocytes, UA: NEGATIVE
Nitrite, UA: NEGATIVE
Protein, UA: NEGATIVE
SPEC GRAV UA: 1.025 (ref 1.010–1.025)
Urobilinogen, UA: 0.2 E.U./dL
pH, UA: 6 (ref 5.0–8.0)

## 2017-11-27 LAB — CBC WITH DIFFERENTIAL/PLATELET
BASOS PCT: 1.6 % (ref 0.0–3.0)
Basophils Absolute: 0.1 10*3/uL (ref 0.0–0.1)
EOS PCT: 2.7 % (ref 0.0–5.0)
Eosinophils Absolute: 0.2 10*3/uL (ref 0.0–0.7)
HEMATOCRIT: 37.8 % (ref 36.0–46.0)
HEMOGLOBIN: 12.6 g/dL (ref 12.0–15.0)
LYMPHS PCT: 33 % (ref 12.0–46.0)
Lymphs Abs: 2.1 10*3/uL (ref 0.7–4.0)
MCHC: 33.3 g/dL (ref 30.0–36.0)
MCV: 84.6 fl (ref 78.0–100.0)
MONO ABS: 0.4 10*3/uL (ref 0.1–1.0)
MONOS PCT: 5.9 % (ref 3.0–12.0)
Neutro Abs: 3.6 10*3/uL (ref 1.4–7.7)
Neutrophils Relative %: 56.8 % (ref 43.0–77.0)
Platelets: 238 10*3/uL (ref 150.0–400.0)
RBC: 4.46 Mil/uL (ref 3.87–5.11)
RDW: 13.3 % (ref 11.5–15.5)
WBC: 6.3 10*3/uL (ref 4.0–10.5)

## 2017-11-27 MED ORDER — CIPROFLOXACIN HCL 500 MG PO TABS: 500.0000 mg | ORAL_TABLET | Freq: Two times a day (BID) | ORAL | 0 refills | Status: DC

## 2017-11-27 MED ORDER — METRONIDAZOLE 500 MG PO TABS: 500.0000 mg | ORAL_TABLET | Freq: Three times a day (TID) | ORAL | 0 refills | Status: DC

## 2017-11-27 NOTE — Assessment & Plan Note (Signed)
This appears to be benign and incidental muscle pain in the neck.  Left SCM is tender.  Not a stiff neck.  Discussed with patient about heat, ice, gentle stretching.  Update me as needed.  She agrees.

## 2017-11-27 NOTE — Patient Instructions (Signed)
Start cipro and flagyl.   Clear liquid diet for now.  Gradually advance your diet when the pain is better.  Go to the lab on the way out.  We'll contact you with your lab report. If worse then update [place] or go to the ER.   Take care.  Glad to see you.

## 2017-11-27 NOTE — Progress Notes (Signed)
Sx started about 4 days ago with fatigue.  2 days ago she woke up with L lower abd pain, present all day.  Worse as the day went on.  Was just as bad yesterday AM but better yesterday PM.  Today is almost gone, clearly better.  No fevers known.  No sweats.  No vomiting.  No diarrhea.  No blood in stool.  Last BM was this AM.  She didn't have BM the last few days, until this AM.    No dysuria.  She had some indigestion recently, used baking soda with relief.    She also had some L sided neck pain.  She has some episodic brief L sided back pain, with a deep breath.    She has h/o diverticulosis noted on CT, d/w pt.  Likely h/o diverticulitis in the distant past.  PMH and SH reviewed  ROS: Per HPI unless specifically indicated in ROS section   Meds, vitals, and allergies reviewed.     GEN: nad, alert and oriented HEENT: mucous membranes moist NECK: supple w/o LA but L SCM ttp CV: rrr.  no murmur PULM: ctab, no inc wob ABD: soft, +bs, LLQ ttp. Not ttp o/w, no rebound EXT: no edema SKIN: well perfused.

## 2017-11-27 NOTE — Assessment & Plan Note (Signed)
Differential diagnosis discussed with patient.  Reasonable to presume at this point that she has a mild case of diverticulitis.  Okay for outpatient follow-up.  Start Cipro and Flagyl.  Routine cautions given on both antibiotics.  Start low fiber diet.  Gradually advance as tolerated.  Check routine labs today.  If worse go to the emergency room.  Routine emergency room cautions given.  All questions answered.  She agrees. >25 minutes spent in face to face time with patient, >50% spent in counselling or coordination of care.

## 2017-12-16 ENCOUNTER — Ambulatory Visit: Payer: Medicare HMO | Admitting: Family Medicine

## 2017-12-24 ENCOUNTER — Encounter: Payer: Self-pay | Admitting: Family Medicine

## 2017-12-24 ENCOUNTER — Ambulatory Visit (INDEPENDENT_AMBULATORY_CARE_PROVIDER_SITE_OTHER): Payer: Medicare HMO | Admitting: Family Medicine

## 2017-12-24 VITALS — BP 128/78 | HR 72 | Temp 98.4°F | Ht 65.0 in | Wt 190.2 lb

## 2017-12-24 DIAGNOSIS — R519 Headache, unspecified: Secondary | ICD-10-CM

## 2017-12-24 DIAGNOSIS — R51 Headache: Secondary | ICD-10-CM | POA: Diagnosis not present

## 2017-12-24 NOTE — Progress Notes (Signed)
L neck pain.   Started a few weeks ago.  Got worse, then better and resolved now.  No R sided sx.  No trauma.  No rash.   HA started in the last week.  L temp, and also posterior to L ear.  She has pressure behind L eye.  Locally tender near the L eye some of the time.  No R sided sx.  No visions changes.  No fevers.  No vomiting.  No chills.  Some rhinorrhea at baseline.  Some eye tearing at baseline.  No maxillary pain.  No sore throat.  She doesn't feel sick.  She doesn't have HAs typically.  She had pain constantly initially but now sporadically.  No clear trigger.  No diet changes.  No rash.  3/10 pain when it happens, not severe.    Prev LLQ pain got better w/o abx tx.  No sx since or now.  D/w pt.   Meds, vitals, and allergies reviewed.   ROS: Per HPI unless specifically indicated in ROS section   GEN: nad, alert and oriented HEENT: mucous membranes moist, B TA area not ttp, TM wnl B, nasal exam minimally stuffy. OP wnl, sinuses not ttp NECK: supple w/o LA CV: rrr. PULM: ctab, no inc wob ABD: soft, +bs EXT: no edema SKIN: no acute rash CN 2-12 wnl B, S/S/DTR wnl x4

## 2017-12-24 NOTE — Patient Instructions (Signed)
Go to the lab on the way out.  We'll contact you with your lab report. Update me in a few days, especially if worse.   Take care.  Glad to see you.

## 2017-12-25 DIAGNOSIS — R51 Headache: Principal | ICD-10-CM

## 2017-12-25 DIAGNOSIS — R519 Headache, unspecified: Secondary | ICD-10-CM | POA: Insufficient documentation

## 2017-12-25 LAB — CBC WITH DIFFERENTIAL/PLATELET
BASOS ABS: 0.1 10*3/uL (ref 0.0–0.1)
Basophils Relative: 1.9 % (ref 0.0–3.0)
Eosinophils Absolute: 0.2 10*3/uL (ref 0.0–0.7)
Eosinophils Relative: 4.2 % (ref 0.0–5.0)
HCT: 36 % (ref 36.0–46.0)
Hemoglobin: 12.1 g/dL (ref 12.0–15.0)
LYMPHS ABS: 2.4 10*3/uL (ref 0.7–4.0)
Lymphocytes Relative: 41.2 % (ref 12.0–46.0)
MCHC: 33.7 g/dL (ref 30.0–36.0)
MCV: 84.9 fl (ref 78.0–100.0)
MONO ABS: 0.5 10*3/uL (ref 0.1–1.0)
Monocytes Relative: 8.1 % (ref 3.0–12.0)
NEUTROS PCT: 44.6 % (ref 43.0–77.0)
Neutro Abs: 2.6 10*3/uL (ref 1.4–7.7)
Platelets: 193 10*3/uL (ref 150.0–400.0)
RBC: 4.24 Mil/uL (ref 3.87–5.11)
RDW: 13.6 % (ref 11.5–15.5)
WBC: 5.8 10*3/uL (ref 4.0–10.5)

## 2017-12-25 LAB — SEDIMENTATION RATE: SED RATE: 31 mm/h — AB (ref 0–30)

## 2017-12-25 LAB — BASIC METABOLIC PANEL
BUN: 9 mg/dL (ref 6–23)
CALCIUM: 10.3 mg/dL (ref 8.4–10.5)
CO2: 29 mEq/L (ref 19–32)
Chloride: 103 mEq/L (ref 96–112)
Creatinine, Ser: 0.71 mg/dL (ref 0.40–1.20)
GFR: 105.04 mL/min (ref >60.00)
Glucose, Bld: 107 mg/dL — ABNORMAL HIGH (ref 70–99)
Potassium: 3.9 mEq/L (ref 3.5–5.1)
SODIUM: 139 meq/L (ref 135–145)

## 2017-12-25 NOTE — Assessment & Plan Note (Signed)
Differential diagnosis discussed with patient. Trigeminal neuralgia less likely since she is not hypersensitive and not having severe pain.  She had episodes of pain that would last longer than typical for trigeminal neuralgia. Temporal arteritis unlikely but reasonable to exclude with sed rate.  Discussed with patient.  See notes on labs. Tension HA possible.  Discussed with patient. Zoster less likely since she has no rash. She has relatively mild symptoms and a normal neurologic exam.  The symptoms are happening less often now.  Symptoms have changed from constant to intermittent.  Observe for now.  Routine emergency room cautions given.  Would not need imaging at this point as it is unlikely to change the plan.  Rationale for plan discussed with patient.  She agrees. >25 minutes spent in face to face time with patient, >50% spent in counselling or coordination of care.

## 2017-12-29 ENCOUNTER — Telehealth: Payer: Self-pay | Admitting: Family Medicine

## 2017-12-29 NOTE — Telephone Encounter (Signed)
Copied from [place] [id number]. Topic: Quick Communication - Rx Refill/Question >> Dec 29, 2017  1:00 PM Oliver Pila B wrote: Medication: verapamil (CALAN-SR) 240 MG CR tablet [[id number]]   Has the patient contacted their pharmacy? Yes.   (Agent: If no, request that the patient contact the pharmacy for the refill.) (Agent: If yes, when and what did the pharmacy advise?)  [facility]  Agent: Please be advised that RX refills may take up to 3 business days. We ask that you follow-up with your pharmacy.

## 2017-12-30 MED ORDER — VERAPAMIL HCL ER 240 MG PO TBCR
240.0000 mg | EXTENDED_RELEASE_TABLET | Freq: Every day | ORAL | 2 refills | Status: DC
Start: 2017-12-30 — End: 2018-07-21

## 2017-12-30 NOTE — Telephone Encounter (Signed)
LOV  06/19 Last annual wellness visit 07/31/17 Dr. Damita Dunnings Refill for verapamil 240 mg CR tab Last refill 07/03/17 for #90 with 2 refills. [phone] [place]

## 2018-03-11 ENCOUNTER — Other Ambulatory Visit: Payer: Self-pay | Admitting: Family Medicine

## 2018-03-11 DIAGNOSIS — Z1231 Encounter for screening mammogram for malignant neoplasm of breast: Secondary | ICD-10-CM

## 2018-03-12 ENCOUNTER — Ambulatory Visit (HOSPITAL_COMMUNITY)
Admission: RE | Admit: 2018-03-12 | Discharge: 2018-03-12 | Disposition: A | Discharge status: Home / Self Care | Payer: Medicare HMO | Source: Ambulatory Visit | Attending: Family Medicine | Admitting: Family Medicine

## 2018-03-12 DIAGNOSIS — Z1231 Encounter for screening mammogram for malignant neoplasm of breast: Secondary | ICD-10-CM | POA: Diagnosis not present

## 2018-05-13 ENCOUNTER — Ambulatory Visit (INDEPENDENT_AMBULATORY_CARE_PROVIDER_SITE_OTHER): Payer: Medicare HMO | Admitting: Family Medicine

## 2018-05-13 ENCOUNTER — Encounter: Payer: Self-pay | Admitting: Family Medicine

## 2018-05-13 VITALS — BP 132/80 | HR 74 | Temp 98.4°F | Ht 65.0 in | Wt 191.0 lb

## 2018-05-13 DIAGNOSIS — Z23 Encounter for immunization: Secondary | ICD-10-CM

## 2018-05-13 DIAGNOSIS — R42 Dizziness and giddiness: Secondary | ICD-10-CM

## 2018-05-13 MED ORDER — FLUTICASONE PROPIONATE 50 MCG/ACT NA SUSP: 2.0000 | Freq: Every day | NASAL | Status: DC | PRN

## 2018-05-13 NOTE — Progress Notes (Signed)
Sx started about 1 week ago.  She was getting off a stool and wasn't dizzy but her balance was off, to either side, not just R vs L.    Prior to that she had noted balance changes in the AM when getting out of bed.  That started a few days prior.    She had episodic sx with balance troubles.  She has a "fogginess" in her head.  She has some occipital headaches and some posterior neck pain.   The room doesn't spin.  She doesn't feel presyncopal.   She doesn't have trouble rolling over in the bed.   No FCNAVD.  She doesn't feel sick o/w.   No focal neuro changes o/w. No weakness or numbness.    The "fogginess" is some better now at [place] compared to earlier today and compared to last week.   The balance troubles are better compared to last week.    She has some epigastric abd pain with leaning forward, better with straightening up.   She has been working out episodically.    Meds, vitals, and allergies reviewed.   ROS: Per HPI unless specifically indicated in ROS section   GEN: nad, alert and oriented HEENT: mucous membranes moist NECK: supple w/o LA, normal range of motion CV: rrr PULM: ctab, no inc wob ABD: soft, +bs EXT: no edema SKIN: no acute rash CN 2-12 wnl B, S/S/DTR wnl x4 Dix-Hallpike maneuver negative bilaterally.  No orthostatic symptoms on standing quickly.  Normal gait.  Normal heel-to-toe testing.

## 2018-05-13 NOTE — Patient Instructions (Addendum)
I think you likely had episodic dysequilibrium.  It should get better.  Work on the bedside exercise.  Update me as needed.  Use heat on your neck.  Take care.  Glad to see you.

## 2018-05-14 NOTE — Assessment & Plan Note (Signed)
She has had episodic disequilibrium over the last week that appears to be improving in the meantime.  She is not orthostatic when she stands.  She does not have any focal neurologic changes.  She is clearly improving in the meantime.  I presume that she had a vestibular issues such as BPV that was causing her symptoms.  Discussed home bedside exercise in the meantime.  Given her improvement and lack of neurologic findings we can defer imaging at this point.  Discussed.  She will update me as needed.  She has a completely normal exam at this point.  I think that some of her occipital headaches are likely related to muscle tightness on the posterior portion of her neck.  Discussed routine stretching and she can update me if that does not gradually improve.  I think that is a separate issue.  She agrees with plan.

## 2018-06-09 ENCOUNTER — Other Ambulatory Visit: Payer: Self-pay

## 2018-06-09 ENCOUNTER — Emergency Department (HOSPITAL_COMMUNITY)
Admission: EM | Admit: 2018-06-09 | Discharge: 2018-06-09 | Disposition: A | Discharge status: Home / Self Care | Payer: Medicare HMO | Attending: Emergency Medicine | Admitting: Emergency Medicine

## 2018-06-09 ENCOUNTER — Emergency Department (HOSPITAL_COMMUNITY): Payer: Medicare HMO

## 2018-06-09 ENCOUNTER — Encounter (HOSPITAL_COMMUNITY): Payer: Self-pay | Admitting: Emergency Medicine

## 2018-06-09 ENCOUNTER — Ambulatory Visit: Payer: Medicare HMO | Admitting: Family Medicine

## 2018-06-09 DIAGNOSIS — I160 Hypertensive urgency: Secondary | ICD-10-CM | POA: Diagnosis not present

## 2018-06-09 DIAGNOSIS — R030 Elevated blood-pressure reading, without diagnosis of hypertension: Secondary | ICD-10-CM | POA: Diagnosis not present

## 2018-06-09 DIAGNOSIS — Z87891 Personal history of nicotine dependence: Secondary | ICD-10-CM | POA: Diagnosis not present

## 2018-06-09 DIAGNOSIS — Z79899 Other long term (current) drug therapy: Secondary | ICD-10-CM | POA: Diagnosis not present

## 2018-06-09 DIAGNOSIS — I10 Essential (primary) hypertension: Secondary | ICD-10-CM | POA: Diagnosis present

## 2018-06-09 LAB — CBC WITH DIFFERENTIAL/PLATELET
Abs Immature Granulocytes: 0.02 10*3/uL (ref 0.00–0.07)
BASOS ABS: 0.1 10*3/uL (ref 0.0–0.1)
BASOS PCT: 1 %
EOS ABS: 0.1 10*3/uL (ref 0.0–0.5)
Eosinophils Relative: 3 %
HEMATOCRIT: 39.4 % (ref 36.0–46.0)
Hemoglobin: 12.5 g/dL (ref 12.0–15.0)
Immature Granulocytes: 0 %
LYMPHS ABS: 1.7 10*3/uL (ref 0.7–4.0)
Lymphocytes Relative: 37 %
MCH: 27.5 pg (ref 26.0–34.0)
MCHC: 31.7 g/dL (ref 30.0–36.0)
MCV: 86.6 fL (ref 80.0–100.0)
MONOS PCT: 8 %
Monocytes Absolute: 0.4 10*3/uL (ref 0.1–1.0)
NEUTROS PCT: 51 %
NRBC: 0 % (ref 0.0–0.2)
Neutro Abs: 2.4 10*3/uL (ref 1.7–7.7)
PLATELETS: 204 10*3/uL (ref 150–400)
RBC: 4.55 MIL/uL (ref 3.87–5.11)
RDW: 14 % (ref 11.5–15.5)
WBC: 4.7 10*3/uL (ref 4.0–10.5)

## 2018-06-09 LAB — COMPREHENSIVE METABOLIC PANEL
ALT: 24 U/L (ref 0–44)
ANION GAP: 8 (ref 5–15)
AST: 25 U/L (ref 15–41)
Albumin: 4.4 g/dL (ref 3.5–5.0)
Alkaline Phosphatase: 55 U/L (ref 38–126)
BILIRUBIN TOTAL: 0.7 mg/dL (ref 0.3–1.2)
BUN: 9 mg/dL (ref 8–23)
CHLORIDE: 104 mmol/L (ref 98–111)
CO2: 25 mmol/L (ref 22–32)
Calcium: 9.4 mg/dL (ref 8.9–10.3)
Creatinine, Ser: 0.59 mg/dL (ref 0.44–1.00)
Glucose, Bld: 96 mg/dL (ref 70–99)
POTASSIUM: 4 mmol/L (ref 3.5–5.1)
Sodium: 137 mmol/L (ref 135–145)
TOTAL PROTEIN: 7.8 g/dL (ref 6.5–8.1)

## 2018-06-09 LAB — TROPONIN I

## 2018-06-09 NOTE — ED Triage Notes (Signed)
PT c/o high blood pressure readings today with home b/p cuff of 199/101 before taking her verapamil at 0915.  PT denies any chest pain or headaches upon arrival.

## 2018-06-09 NOTE — ED Provider Notes (Signed)
[facility] EMERGENCY DEPARTMENT Provider Note   CSN: [id number] Arrival date & time: 06/09/18  1143     History   Chief Complaint Chief Complaint  Patient presents with  . Hypertension    HPI [name] is a 67 y.o. female.  HPI Patient with a history of hypertrophic cardia myopathy presents with concern of feeling differently from normal, with hypertension. Patient notes that she was in her usual state of health until this morning about 5 hours ago, she felt mild tingling in her right heel, general sensation of not feeling quite right, and after checking her blood pressure, found to be substantially elevated. Subsequently she took her home medication, verapamil, and she has felt closer to normal, and her blood pressure numbers have improved. Currently she states that she feels fine, denies vision changes, confusion, dissertation, pain. She does have ongoing mild headache, in the bitemporal region, without vision changes. Past Medical History:  Diagnosis Date  . Diverticulosis   . GERD (gastroesophageal reflux disease)    occasional; OTC as needed  . 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  . Hypertrophic cardiomyopathy ([place])   . Palpitations   . Trigger finger of left hand 09/2012   left long finger    Patient Active Problem List   Diagnosis Date Noted  . Left-sided headache 12/25/2017  . LLQ pain 11/27/2017  . Neck pain 11/27/2017  . Aortic atherosclerosis ([place]) 09/30/2017  . Dyslipidemia 09/30/2017  . Advance care planning 06/07/2015  . Achilles tendonitis 09/15/2013  . Disequilibrium 03/19/2013  . Skin lesion 07/10/2012  . History of hepatitis C 05/29/2011  . Mitral and aortic regurgitation 01/24/2011  . Medicare annual wellness visit, subsequent 01/12/2011  . LVH (left ventricular hypertrophy) 05/03/2009  . DIVERTICULITIS OF COLON 03/02/2009  . External hemorrhoids 10/24/2008  . HYPERCHOLESTEROLEMIA 10/07/2006  . Essential hypertension  12/06/2005  . Hyperglycemia 12/06/2005    Past Surgical History:  Procedure Laterality Date  . BREAST REDUCTION SURGERY Bilateral 2001  . CHOLECYSTECTOMY  07/17/2011   Procedure: LAPAROSCOPIC CHOLECYSTECTOMY WITH INTRAOPERATIVE CHOLANGIOGRAM;  Surgeon: Haywood Lasso, MD;  Location: [place];  Service: General;  Laterality: N/A;  . FOOT GANGLION EXCISION Right   . Reader  . LACERATION REPAIR Right 1996   thumb  . MYOMECTOMY  10/09/99   x 2  . OOPHORECTOMY Right 1980  . OOPHORECTOMY Left 2000  . OVARIAN CYST DRAINAGE  1980  . PARTIAL HYSTERECTOMY  2002   with no remaining ovary  . TRIGGER FINGER RELEASE Left 09/28/2012   Procedure: RELEASE TRIGGER FINGER/A-1 PULLEY;  Surgeon: Jolyn Nap, MD;  Location: [place];  Service: Orthopedics;  Laterality: Left;     OB History    Gravida      Para      Term      Preterm      AB      Living  2     SAB      TAB      Ectopic      Multiple      Live Births               Home Medications    Prior to Admission medications   Medication Sig Start Date End Date Taking? Authorizing Provider  b complex vitamins tablet Take 1 tablet by mouth daily.    [provider]  fluticasone (FLONASE) 50 MCG/ACT nasal spray Place 2 sprays into  both nostrils daily as needed. 05/13/18   Tonia Ghent, MD  verapamil (CALAN-SR) 240 MG CR tablet Take 1 tablet (240 mg total) by mouth daily. 12/30/17   Tonia Ghent, MD    Family History Family History  Problem Relation Age of Onset  . Colon cancer Father   . Hepatitis C Brother   . Diabetes Sister   . Hypertension Brother   . Hepatitis C Brother   . Colon cancer Paternal Aunt   . Breast cancer Paternal Aunt   . Lung cancer Paternal Uncle   . Breast cancer Cousin   . Liver cancer Cousin   . Colon cancer Unknown        Aunt    Social History Social History   Tobacco Use  . Smoking status: Former Smoker    Packs/day: 2.50     Years: 42.00    Pack years: 105.00    Types: Cigarettes    Last attempt to quit: 07/07/2005    Years since quitting: 12.9  . Smokeless tobacco: Never Used  . Tobacco comment: quit smoking 2008  Substance Use Topics  . Alcohol use: Yes    Alcohol/week: 0.0 standard drinks    Comment: a few glasses a wine a week  . Drug use: No    Comment: previous hx. of     Allergies   Morphine sulfate; Penicillins; and Acyclovir and related   Review of Systems Review of Systems  Constitutional:       Per HPI, otherwise negative  HENT:       Per HPI, otherwise negative  Eyes: Negative for pain and visual disturbance.  Respiratory:       Per HPI, otherwise negative  Cardiovascular:       Per HPI, otherwise negative  Gastrointestinal: Negative for vomiting.  Endocrine:       Negative aside from HPI  Genitourinary:       Neg aside from HPI   Musculoskeletal:       Per HPI, otherwise negative  Skin: Negative.   Neurological: Negative for syncope.     Physical Exam Updated Vital Signs BP 133/88   Pulse 65   Temp 98.9 F (37.2 C) (Oral)   Resp 17   Ht 5\' 5"  (1.651 m)   Wt 86.2 kg   SpO2 98%   BMI 31.62 kg/m   Physical Exam  Constitutional: She is oriented to person, place, and time. She appears well-developed and well-nourished. No distress.  HENT:  Head: Normocephalic and atraumatic.  Eyes: Conjunctivae and EOM are normal.  No temporal pain with palpation  Cardiovascular: Normal rate and regular rhythm.  Pulmonary/Chest: Effort normal and breath sounds normal. No stridor. No respiratory distress.  Abdominal: She exhibits no distension.  Musculoskeletal: She exhibits no edema.  Neurological: She is alert and oriented to person, place, and time. No cranial nerve deficit.  Skin: Skin is warm and dry.  Psychiatric: She has a normal mood and affect.  Nursing note and vitals reviewed.    ED Treatments / Results  Labs (all labs ordered are listed, but only abnormal  results are displayed) Labs Reviewed  CBC WITH DIFFERENTIAL/PLATELET  COMPREHENSIVE METABOLIC PANEL  TROPONIN I    EKG EKG Interpretation  Date/Time:  Tuesday June 09 2018 13:48:33 EST Ventricular Rate:  65 PR Interval:    QRS Duration: 80 QT Interval:  500 QTC Calculation: 520 R Axis:   53 Text Interpretation:  Sinus rhythm Anteroseptal infarct, old Borderline T abnormalities,  inferior leads Prolonged QT interval Baseline wander Artifact Abnormal ekg Confirmed by Carmin Muskrat [phone]) on 06/09/2018 1:56:17 PM   Radiology Dg Chest 2 View  Result Date: 06/09/2018 CLINICAL DATA:  No chest complaints. Elevated blood pressure. EXAM: CHEST - 2 VIEW COMPARISON:  Chest radiograph 08/24/2012. Most recent chest CT 08/04/2017. FINDINGS: The heart size and mediastinal contours are within normal limits. Both lungs are clear. The visualized skeletal structures are unremarkable except for degenerative change in the thoracic spine. Previously identified pulmonary nodules better visualized on CT. IMPRESSION: No active cardiopulmonary disease. Electronically Signed   By: Staci Righter M.D.   On: 06/09/2018 12:42    Procedures Procedures (including critical care time)  Medications Ordered in ED Medications - No data to display   Initial Impression / Assessment and Plan / ED Course  I have reviewed the triage vital signs and the nursing notes.  Pertinent labs & imaging results that were available during my care of the patient were reviewed by me and considered in my medical decision making (see chart for details).     3:01 PM Patient in no distress, awake, alert, playing solitaire on multiple telephone. We discussed all findings including reassuring labs, no evidence for endorgan effects. With no distress, no neurologic deficiencies, improved blood pressure here, there is some suspicion for hypertensive urgency, no evidence for hypertensive crisis. 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  Final Clinical Impressions(s) / ED Diagnoses   Final diagnoses:  Hypertensive urgency     Carmin Muskrat, MD 06/09/18 1501

## 2018-06-09 NOTE — Discharge Instructions (Signed)
As discussed, your evaluation today has been largely reassuring.  But, it is important that you monitor your condition carefully, and do not hesitate to return to the ED if you develop new, or concerning changes in your condition. ? ?Otherwise, please follow-up with your physician for appropriate ongoing care. ? ?

## 2018-07-21 ENCOUNTER — Encounter: Payer: Self-pay | Admitting: *Deleted

## 2018-07-21 ENCOUNTER — Other Ambulatory Visit: Payer: Self-pay | Admitting: *Deleted

## 2018-07-21 MED ORDER — VERAPAMIL HCL ER 240 MG PO TBCR: 240.0000 mg | EXTENDED_RELEASE_TABLET | Freq: Every day | ORAL | 1 refills | Status: DC

## 2018-08-05 ENCOUNTER — Ambulatory Visit (INDEPENDENT_AMBULATORY_CARE_PROVIDER_SITE_OTHER)
Admission: RE | Admit: 2018-08-05 | Discharge: 2018-08-05 | Disposition: A | Discharge status: Home / Self Care | Payer: Medicare HMO | Source: Ambulatory Visit | Attending: Acute Care | Admitting: Acute Care

## 2018-08-05 DIAGNOSIS — Z87891 Personal history of nicotine dependence: Secondary | ICD-10-CM

## 2018-08-05 DIAGNOSIS — Z122 Encounter for screening for malignant neoplasm of respiratory organs: Secondary | ICD-10-CM

## 2018-08-07 ENCOUNTER — Other Ambulatory Visit: Payer: Self-pay | Admitting: Acute Care

## 2018-08-07 DIAGNOSIS — Z87891 Personal history of nicotine dependence: Secondary | ICD-10-CM

## 2018-08-07 DIAGNOSIS — Z122 Encounter for screening for malignant neoplasm of respiratory organs: Secondary | ICD-10-CM

## 2018-09-10 ENCOUNTER — Ambulatory Visit: Payer: Medicare HMO | Admitting: Family Medicine

## 2018-09-11 ENCOUNTER — Ambulatory Visit: Payer: Medicare HMO | Admitting: Family Medicine

## 2018-09-18 ENCOUNTER — Encounter: Payer: Medicare HMO | Admitting: Family Medicine

## 2018-10-05 ENCOUNTER — Other Ambulatory Visit: Payer: Self-pay | Admitting: Family Medicine

## 2018-10-31 ENCOUNTER — Telehealth: Payer: Self-pay | Admitting: Family Medicine

## 2018-11-02 NOTE — Telephone Encounter (Signed)
Patient called and said she does not need any refills but would like to make an appointment.  Please contact patient to set up appointment.

## 2018-11-02 NOTE — Telephone Encounter (Signed)
Pharmacy requests refill on: Verapamil   LAST REFILL: #90, 1 refill  LAST OV: 05/13/18 NEXT OV: not scheduled patient cancelled her last appt and a letter has been sent out that patient must make appointment before further refills can be given.  [facility] [place]  Note: patient should have enough medication to carry her through until July.  I will forward note to Bridgett at the front desk to see if she can either get her set up for a virtual visit.   [place], please let Dr. Damita Dunnings know if patient cannot be set up for a virtual and see if he can do a phone visit or if we need to get her in the office later in June.  We cannot refill her medication until she has been seen.   Thanks.

## 2018-11-02 NOTE — Telephone Encounter (Signed)
Lvm pt needs to call us back and schedule phone/virtual visit for meds.

## 2018-11-03 ENCOUNTER — Encounter: Payer: Self-pay | Admitting: Family Medicine

## 2018-11-03 ENCOUNTER — Ambulatory Visit (INDEPENDENT_AMBULATORY_CARE_PROVIDER_SITE_OTHER): Payer: Medicare HMO | Admitting: Family Medicine

## 2018-11-03 DIAGNOSIS — I517 Cardiomegaly: Secondary | ICD-10-CM | POA: Diagnosis not present

## 2018-11-03 DIAGNOSIS — I10 Essential (primary) hypertension: Secondary | ICD-10-CM | POA: Diagnosis not present

## 2018-11-03 DIAGNOSIS — Z7189 Other specified counseling: Secondary | ICD-10-CM

## 2018-11-03 DIAGNOSIS — R2689 Other abnormalities of gait and mobility: Secondary | ICD-10-CM

## 2018-11-03 DIAGNOSIS — Z0000 Encounter for general adult medical examination without abnormal findings: Secondary | ICD-10-CM

## 2018-11-03 DIAGNOSIS — E7800 Pure hypercholesterolemia, unspecified: Secondary | ICD-10-CM

## 2018-11-03 MED ORDER — VERAPAMIL HCL ER 240 MG PO TBCR: 240.0000 mg | EXTENDED_RELEASE_TABLET | Freq: Every day | ORAL | 3 refills | Status: DC

## 2018-11-03 NOTE — Telephone Encounter (Signed)
[facility] so she don't see why they keep sending requests for her. She did want to set up virtual appt with Dr. Damita Dunnings scheduled today 11/03/18 @ 3:15pm.

## 2018-11-03 NOTE — Progress Notes (Signed)
Virtual visit completed through WebEx or similar program Patient location: home  Provider location: [place] at [facility], office   Limitations and rationale for visit method d/w patient.  Patient agreed to proceed.   CC: follow up.    HPI:  Pandemic considerations d/w pt.  She has no sx.    HTN/Hypertrophic cardiomyopathy.  Still on CCB.  No exertional CP, SOB, BLE edema.  Not lightheaded.  Compliant with med.  Diet and exercise d/w pt.  She needs lipid check, d/w pt.   She has occ feeling of being off balance w/o spinning or lightheadedness.  No falls except for tripping once and that was incidental.  Cautions d/w pt.  No injury.  She doesn't have h/o list to one side.  No weakness or numbness.  Handwriting is the same.  We talked about balance exercises at home.  Flu 2019 Shingles 2014 PNA 2016 Tetanus 09/2014, done out of clinic.  Colonoscopy 2016 Breast cancer screening 2019 DXA declined, d/w pt.  Advance directive- son Orvan July if patient were incapacitated.   She has been working on diet and exercise.   Defer recheck sugar with A1c given prev normal sugar.   Past medical history, social history reviewed.  Family history reviewed.  Meds and allergies reviewed.   ROS: Per HPI unless specifically indicated in ROS section   NAD Speech wnl  A/P:   HTN/Hypertrophic cardiomyopathy.  Still on CCB.  No exertional CP, SOB, BLE edema.  Not lightheaded.  Compliant with med.  Diet and exercise d/w pt.  She needs lipid check, d/w pt.  Plan for lab visit tomorrow at 2:15 pm Consider statin if lipids up given prev scan.  I will update Dr. Percival Spanish about labs.    She has occ feeling of being off balance w/o spinning or lightheadedness.  No falls except for tripping once and that was incidental.  Cautions d/w pt.  No injury.  She doesn't have h/o list to one side.  No weakness or numbness.  Handwriting is the same.  We talked about balance exercises at home.   Flu  2019 Shingles 2014 PNA 2016 Tetanus 09/2014, done out of clinic.  Colonoscopy 2016 Breast cancer screening 2019 DXA declined, d/w pt.  Advance directive- son Orvan July if patient were incapacitated.   She has been working on diet and exercise.   Defer recheck sugar with A1c given prev normal sugar.    She is using [place] mail order and last rx went there, d/w pt.

## 2018-11-03 NOTE — Telephone Encounter (Signed)
Dr. Damita Dunnings,  You have a virtual visit today with patient at 3:15pm.  Can you please clarify which pharmacy she is using and refill her meds as appropriate?    Thanks.

## 2018-11-04 ENCOUNTER — Encounter: Payer: Self-pay | Admitting: Family Medicine

## 2018-11-04 ENCOUNTER — Other Ambulatory Visit: Payer: Medicare HMO

## 2018-11-04 DIAGNOSIS — Z0000 Encounter for general adult medical examination without abnormal findings: Secondary | ICD-10-CM | POA: Insufficient documentation

## 2018-11-04 NOTE — Assessment & Plan Note (Signed)
Flu 2019 Shingles 2014 PNA 2016 Tetanus 09/2014, done out of clinic.  Colonoscopy 2016 Breast cancer screening 2019 DXA declined, d/w pt.  Advance directive- son Orvan July if patient were incapacitated.   She has been working on diet and exercise.   Defer recheck sugar with A1c given prev normal sugar.

## 2018-11-04 NOTE — Assessment & Plan Note (Signed)
Advance directive- son Maurice designated if patient were incapacitated.   

## 2018-11-04 NOTE — Assessment & Plan Note (Signed)
Consider statin if lipids up given prev scan.  I will update Dr. Percival Spanish about labs.

## 2018-11-04 NOTE — Assessment & Plan Note (Signed)
HTN/Hypertrophic cardiomyopathy.  Still on CCB.  No exertional CP, SOB, BLE edema.  Not lightheaded.  Compliant with med.  Diet and exercise d/w pt.  She needs lipid check, d/w pt.  Plan for lab visit tomorrow at 2:15 pm Consider statin if lipids up given prev scan.  I will update Dr. Percival Spanish about labs.

## 2018-11-04 NOTE — Assessment & Plan Note (Signed)
She has occ feeling of being off balance w/o spinning or lightheadedness.  No falls except for tripping once and that was incidental.  Cautions d/w pt.  No injury.  She doesn't have h/o list to one side.  No weakness or numbness.  Handwriting is the same.  We talked about balance exercises at home.  She will give that a try and update me as needed.

## 2018-11-04 NOTE — Telephone Encounter (Signed)
See OV note.  Thanks.  

## 2018-11-12 ENCOUNTER — Other Ambulatory Visit (INDEPENDENT_AMBULATORY_CARE_PROVIDER_SITE_OTHER): Payer: Medicare HMO

## 2018-11-12 ENCOUNTER — Other Ambulatory Visit: Payer: Self-pay

## 2018-11-12 DIAGNOSIS — I10 Essential (primary) hypertension: Secondary | ICD-10-CM | POA: Diagnosis not present

## 2018-11-13 LAB — LIPID PANEL
Cholesterol: 201 mg/dL — ABNORMAL HIGH (ref 0–200)
HDL: 45.3 mg/dL (ref >39.00)
LDL Cholesterol: 135 mg/dL — ABNORMAL HIGH (ref 0–99)
NonHDL: 155.98
Total CHOL/HDL Ratio: 4
Triglycerides: 104 mg/dL (ref 0.0–149.0)
VLDL: 20.8 mg/dL (ref 0.0–40.0)

## 2018-11-18 ENCOUNTER — Other Ambulatory Visit: Payer: Self-pay | Admitting: Family Medicine

## 2018-11-18 DIAGNOSIS — E7800 Pure hypercholesterolemia, unspecified: Secondary | ICD-10-CM

## 2018-12-10 ENCOUNTER — Telehealth: Payer: Self-pay | Admitting: Cardiology

## 2018-12-10 NOTE — Telephone Encounter (Signed)
° ° °  Patient wants to know if she should be taking a "Statin" medication based on recent labs

## 2018-12-10 NOTE — Telephone Encounter (Signed)
Returned call to pt she states that her PCP ran some labs and her chol is very high still she states that she has changed her eating patterns and stopped eating meats and is on low fat/card are at 20G per day.she  Walks 2 miles every/every other day. She is eating ply veggies. She states that she does not want to start taking statin she states "once you start stating then everything else starts going wrong" so she is making these adjustments for her lifestyle and hopefully she will not have to start a statin.

## 2018-12-11 ENCOUNTER — Encounter: Payer: Self-pay | Admitting: Family Medicine

## 2018-12-11 NOTE — Telephone Encounter (Signed)
I spoke with patient around the time of mychart message, not sure if patient sent this before or after we spoke. I apologized for her wait at that time. I was in the room with patient and a skype message was sent to me that patient was returning my call and was on hold/parked. I did not see this until I was done with the patient I was rooming. I apologized to patient for the wait several times. I thought patient was ok after we spoke. Again not sure of the time of the message. Apologize. Sending to Dr. Damita Dunnings to review

## 2018-12-11 NOTE — Telephone Encounter (Signed)
Patient advised and appt made

## 2018-12-11 NOTE — Telephone Encounter (Signed)
Left message for patient to call back  

## 2018-12-11 NOTE — Telephone Encounter (Signed)
Okay. I put in the follow-up lipid order for 6 months. Please schedule lab visit. Thanks.

## 2018-12-15 ENCOUNTER — Telehealth: Payer: Self-pay | Admitting: *Deleted

## 2018-12-15 NOTE — Telephone Encounter (Signed)
Patient let a voicemail stating that she talked to her sister and she is taking a statin drug 2-3 times a week. Patient stated that she is okay taking a statin like her sister takes.

## 2018-12-17 MED ORDER — PRAVASTATIN SODIUM 10 MG PO TABS: 10.0000 mg | ORAL_TABLET | ORAL | 3 refills | Status: DC

## 2018-12-17 NOTE — Telephone Encounter (Signed)
Best number [phone] Pt called stating she is ok with taking a stating as long as she doesn't have to take every day.   [facility]

## 2018-12-17 NOTE — Telephone Encounter (Signed)
Sent.  See if she can tolerate pravastatin 10mg  MWF.  rx sent.   Stop med if sig aches with use.  If able to take med, would recheck lipids in 3-6 months.  Order in [place].  Thanks.

## 2018-12-17 NOTE — Telephone Encounter (Signed)
Left detailed message on voicemail.  

## 2019-01-04 ENCOUNTER — Encounter: Payer: Self-pay | Admitting: Family Medicine

## 2019-01-04 ENCOUNTER — Other Ambulatory Visit: Payer: Self-pay

## 2019-01-04 ENCOUNTER — Ambulatory Visit (INDEPENDENT_AMBULATORY_CARE_PROVIDER_SITE_OTHER): Payer: Medicare HMO | Admitting: Family Medicine

## 2019-01-04 DIAGNOSIS — R21 Rash and other nonspecific skin eruption: Secondary | ICD-10-CM

## 2019-01-04 DIAGNOSIS — M542 Cervicalgia: Secondary | ICD-10-CM

## 2019-01-04 MED ORDER — KETOCONAZOLE 2 % EX CREA: 1.0000 "application " | TOPICAL_CREAM | Freq: Every day | CUTANEOUS | 0 refills | Status: DC

## 2019-01-04 MED ORDER — PREDNISONE 10 MG PO TABS: ORAL_TABLET | ORAL | 0 refills | Status: DC

## 2019-01-04 NOTE — Patient Instructions (Signed)
Prednisone with food.   Use the cream on the rash.   Update me as needed.  Gently stretch your neck.  We can send you to PT or a chiropractor if needed.  Take care.  Glad to see you.

## 2019-01-04 NOTE — Progress Notes (Signed)
She is on statin MWF but not having diffuse aches.  She thinks she can tolerate medication.  L shoulder pain predates statin use.  Going on for 2 months.  No R sided sx.  No trauma.  No clear trigger.  No pain with overhead movement.  She has tingling that can move down the L arm into the palm of the hand and the L1st and 3rd fingers.  No pain sleeping on L side.    No FCNAVD.  Hypopigmented rash on the posterior neck, itchy locally.  Meds, vitals, and allergies reviewed.   ROS: Per HPI unless specifically indicated in ROS section   nad ncat Neck supple Normal L shoulder ROM with abduction, internal rotation, external rotation. Normal BUE DTRs.  S/S grossly wnl BUE Pain in L upper back near her neck with looking down and to the right.  Hypopigmented rash on the posterior neck, no ulceration.  Crosses the midline.

## 2019-01-06 NOTE — Assessment & Plan Note (Signed)
This looks typical for tinea versicolor.  Discussed rash and treatment.  Use ketoconazole and update me as needed.

## 2019-01-06 NOTE — Assessment & Plan Note (Signed)
I think the issue is nerve compression in her neck that is causing symptoms that radiate down the left arm.  She does not have a primary shoulder issue based on this exam.  Discussed.  Reasonable to try prednisone with routine steroid caution.  Gently stretch her neck.  We can send her to PT versus chiropractor if needed.  See after visit summary.  Update me as needed.  Imaging is likely not going to change the plan at this point.  She has a benign exam otherwise.  She agrees with plan.

## 2019-01-13 DIAGNOSIS — M9901 Segmental and somatic dysfunction of cervical region: Secondary | ICD-10-CM | POA: Diagnosis not present

## 2019-01-13 DIAGNOSIS — M50322 Other cervical disc degeneration at C5-C6 level: Secondary | ICD-10-CM | POA: Diagnosis not present

## 2019-01-14 DIAGNOSIS — M50322 Other cervical disc degeneration at C5-C6 level: Secondary | ICD-10-CM | POA: Diagnosis not present

## 2019-01-14 DIAGNOSIS — M9901 Segmental and somatic dysfunction of cervical region: Secondary | ICD-10-CM | POA: Diagnosis not present

## 2019-03-24 ENCOUNTER — Ambulatory Visit (HOSPITAL_COMMUNITY)
Admission: RE | Admit: 2019-03-24 | Discharge: 2019-03-24 | Disposition: A | Discharge status: Home / Self Care | Payer: Medicare HMO | Source: Ambulatory Visit | Attending: Family Medicine | Admitting: Family Medicine

## 2019-03-24 ENCOUNTER — Other Ambulatory Visit: Payer: Self-pay

## 2019-03-24 ENCOUNTER — Other Ambulatory Visit (HOSPITAL_COMMUNITY): Payer: Self-pay | Admitting: Family Medicine

## 2019-03-24 DIAGNOSIS — Z1231 Encounter for screening mammogram for malignant neoplasm of breast: Secondary | ICD-10-CM

## 2019-03-28 NOTE — Progress Notes (Signed)
HPI The patient presents for followup of mild ventricular hypertrophy. She's also had palpitations. An echo in 2010 suggested moderate mitral regurgitation directed posteriorly. There was LVH. This was severe. She is also had some aortic insufficiency. However followup echocardiography in 2013 suggested only mild mitral regurgitation and no significant ventricular hypertrophy or aortic insufficiency.  The last echo in 2017 demonstrated mild LVH.    Since I last saw her she has done well.  She is exercising routinely.  She did have a "drum roll" sensation going on in her chest sporadically over the past weeks although she is not had in the last couple of weeks.  Was a vague sensation that she could not quantify or qualify.  She was not describing tachypalpitations, presyncope or syncope but this may have been some cardiac arrhythmia.  She has not had any new shortness of breath, PND or orthopnea.  She is had no chest pressure, neck or arm discomfort.  She had no weight gain or edema.   Allergies  Allergen Reactions  . Morphine Sulfate Swelling  . Penicillins Swelling  . Acyclovir And Related     Current Outpatient Medications  Medication Sig Dispense Refill  . b complex vitamins tablet Take 1 tablet by mouth daily.    . fluticasone (FLONASE) 50 MCG/ACT nasal spray Place 2 sprays into both nostrils daily as needed.    Marland Kitchen ketoconazole (NIZORAL) 2 % cream Apply 1 application topically daily. 15 g 0  . pravastatin (PRAVACHOL) 10 MG tablet Take 1 tablet (10 mg total) by mouth every Monday, Wednesday, and Friday. 45 tablet 3  . predniSONE (DELTASONE) 10 MG tablet Take 2 a day for 5 days, then 1 a day for 5 days, with food. Don't take with aleve/ibuprofen. 15 tablet 0  . verapamil (CALAN-SR) 240 MG CR tablet Take 1 tablet (240 mg total) by mouth daily. 90 tablet 3   No current facility-administered medications for this visit.     Past Medical History:  Diagnosis Date  . Diverticulosis   .  GERD (gastroesophageal reflux disease)    occasional; OTC as needed  . 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  . Hypertrophic cardiomyopathy ([place])   . Palpitations   . Trigger finger of left hand 09/2012   left long finger    Past Surgical History:  Procedure Laterality Date  . BREAST REDUCTION SURGERY Bilateral 2001  . CHOLECYSTECTOMY  07/17/2011   Procedure: LAPAROSCOPIC CHOLECYSTECTOMY WITH INTRAOPERATIVE CHOLANGIOGRAM;  Surgeon: Haywood Lasso, MD;  Location: [facility];  Service: General;  Laterality: N/A;  . FOOT GANGLION EXCISION Right   . [place]  . LACERATION REPAIR Right 1996   thumb  . MYOMECTOMY  10/09/99   x 2  . OOPHORECTOMY Right 1980  . OOPHORECTOMY Left 2000  . OVARIAN CYST DRAINAGE  1980  . PARTIAL HYSTERECTOMY  2002   with no remaining ovary  . TRIGGER FINGER RELEASE Left 09/28/2012   Procedure: RELEASE TRIGGER FINGER/A-1 PULLEY;  Surgeon: Jolyn Nap, MD;  Location: [facility];  Service: Orthopedics;  Laterality: Left;    ROS:   As stated in the HPI and negative for all other systems.  PHYSICAL EXAM BP (!) 147/90   Pulse 76   Temp (!) 97.1 F (36.2 C)   Ht 5\' 5"  (1.651 m)   Wt 183 lb 6.4 oz (83.2 kg)   SpO2 98%   BMI 30.52 kg/m  GENERAL:  Well appearing NECK:  No jugular venous distention, waveform within normal limits, carotid upstroke brisk and symmetric, no bruits, no thyromegaly LUNGS:  Clear to auscultation bilaterally CHEST:  Unremarkable HEART:  PMI not displaced or sustained,S1 and S2 within normal limits, no S3, no S4, no clicks, no rubs, no murmurs ABD:  Flat, positive bowel sounds normal in frequency in pitch, no bruits, no rebound, no guarding, no midline pulsatile mass, no hepatomegaly, no splenomegaly EXT:  2 plus pulses throughout, no edema, no cyanosis no clubbing    EKG:  Sinus rhythm, rate 64 , axis within normal limits, intervals within normal limits, mild lateral T-wave inversions  progress previous  No change from previous. . 03/29/2019   ASSESSMENT AND PLAN  LVH:   This was mild.   I do not think further testing is indicated.  I will follow this clinically.  HTN:  BP is very mildly elevated but typically very well controlled at home.  No change in therapy.   PALPITATIONS: She is going to consider getting Alive Cor to further manage any arrhythmias.   DYSLIPIDEMIA:    LDL was elevated at 135 but since that she started 3 times weekly low-dose pravastatin.  She can get this repeated and has this scheduled.  I would suggest that if her LDL is not less than 100 and she go to every day low-dose pravastatin and she would consider this though reluctantly. \  AORTIC ATHEROSCLEROSIS:  This was noted on CT.   we will continue with aggressive risk reduction.

## 2019-03-29 ENCOUNTER — Other Ambulatory Visit: Payer: Self-pay

## 2019-03-29 ENCOUNTER — Encounter: Payer: Self-pay | Admitting: Cardiology

## 2019-03-29 ENCOUNTER — Ambulatory Visit (INDEPENDENT_AMBULATORY_CARE_PROVIDER_SITE_OTHER): Payer: Medicare HMO | Admitting: Cardiology

## 2019-03-29 VITALS — BP 147/90 | HR 76 | Temp 97.1°F | Ht 65.0 in | Wt 183.4 lb

## 2019-03-29 DIAGNOSIS — I700 Atherosclerosis of aorta: Secondary | ICD-10-CM

## 2019-03-29 DIAGNOSIS — I10 Essential (primary) hypertension: Secondary | ICD-10-CM

## 2019-03-29 DIAGNOSIS — E785 Hyperlipidemia, unspecified: Secondary | ICD-10-CM

## 2019-03-29 DIAGNOSIS — I517 Cardiomegaly: Secondary | ICD-10-CM

## 2019-03-29 NOTE — Patient Instructions (Addendum)
Medication Instructions:  Your physician recommends that you continue on your current medications as directed. Please refer to the Current Medication list given to you today.  If you need a refill on your cardiac medications before your next appointment, please call your pharmacy.   Lab work: NONE  Testing/Procedures: NONE  Follow-Up: At [facility], you and your health needs are our priority.  As part of our continuing mission to provide you with exceptional heart care, we have created designated Provider Care Teams.  These Care Teams include your primary Cardiologist (physician) and Advanced Practice Providers (APPs -  Physician Assistants and Nurse Practitioners) who all work together to provide you with the care you need, when you need it. You will need a follow up appointment in 12 months.  Please call our office 2 months in advance to schedule this appointment.  You may see Dr. Percival Spanish or one of the following Advanced Practice Providers on your designated Care Team:   Rosaria Ferries, PA-C Jory Sims, DNP, ANP  Any Other Special Instructions Will Be Listed Below (If Applicable). Dr. Percival Spanish suggest getting a AliveCor - mobile app.

## 2019-04-01 ENCOUNTER — Ambulatory Visit (INDEPENDENT_AMBULATORY_CARE_PROVIDER_SITE_OTHER): Payer: Medicare HMO

## 2019-04-01 ENCOUNTER — Other Ambulatory Visit (INDEPENDENT_AMBULATORY_CARE_PROVIDER_SITE_OTHER): Payer: Medicare HMO

## 2019-04-01 DIAGNOSIS — E7800 Pure hypercholesterolemia, unspecified: Secondary | ICD-10-CM | POA: Diagnosis not present

## 2019-04-01 DIAGNOSIS — Z23 Encounter for immunization: Secondary | ICD-10-CM

## 2019-04-01 LAB — LIPID PANEL
Cholesterol: 202 mg/dL — ABNORMAL HIGH (ref 0–200)
HDL: 53.4 mg/dL (ref >39.00)
LDL Cholesterol: 129 mg/dL — ABNORMAL HIGH (ref 0–99)
NonHDL: 148.12
Total CHOL/HDL Ratio: 4
Triglycerides: 94 mg/dL (ref 0.0–149.0)
VLDL: 18.8 mg/dL (ref 0.0–40.0)

## 2019-04-04 ENCOUNTER — Encounter (HOSPITAL_BASED_OUTPATIENT_CLINIC_OR_DEPARTMENT_OTHER): Payer: Self-pay | Admitting: Emergency Medicine

## 2019-04-04 ENCOUNTER — Emergency Department (HOSPITAL_BASED_OUTPATIENT_CLINIC_OR_DEPARTMENT_OTHER)
Admission: EM | Admit: 2019-04-04 | Discharge: 2019-04-04 | Disposition: A | Discharge status: Home / Self Care | Payer: Medicare HMO | Attending: Emergency Medicine | Admitting: Emergency Medicine

## 2019-04-04 ENCOUNTER — Other Ambulatory Visit: Payer: Self-pay

## 2019-04-04 DIAGNOSIS — Z79899 Other long term (current) drug therapy: Secondary | ICD-10-CM | POA: Insufficient documentation

## 2019-04-04 DIAGNOSIS — I10 Essential (primary) hypertension: Secondary | ICD-10-CM | POA: Insufficient documentation

## 2019-04-04 DIAGNOSIS — M542 Cervicalgia: Secondary | ICD-10-CM | POA: Diagnosis not present

## 2019-04-04 DIAGNOSIS — H9391 Unspecified disorder of right ear: Secondary | ICD-10-CM | POA: Diagnosis present

## 2019-04-04 DIAGNOSIS — Z87891 Personal history of nicotine dependence: Secondary | ICD-10-CM | POA: Insufficient documentation

## 2019-04-04 DIAGNOSIS — H9311 Tinnitus, right ear: Secondary | ICD-10-CM | POA: Insufficient documentation

## 2019-04-04 NOTE — ED Notes (Signed)
ED Provider at bedside. 

## 2019-04-04 NOTE — ED Triage Notes (Signed)
Pt reports her R ear has been vibrating intermittently since yesterday. Denies pain.

## 2019-04-04 NOTE — ED Provider Notes (Signed)
[facility] EMERGENCY DEPARTMENT Provider Note   CSN: [id number] Arrival date & time: 04/04/19  1537     History   Chief Complaint Chief Complaint  Patient presents with  . Ear Problem    HPI [name] is a 68 y.o. female.     HPI  68 year old female presents with an abnormal sensation in her right ear.  Started yesterday.  It feels like a vibration.  It is not ringing and there is no pain.  No headache.  She has some chronic left-sided neck pain that she describes as a pinched nerve but this has not been new or worse.  Usually this occurs first thing in the morning for about an hour and then randomly throughout the day for either 10 minutes or up to an hour.  Nothing she does such as positions makes it happen or stop. This has not affected her hearing.  Past Medical History:  Diagnosis Date  . Diverticulosis   . GERD (gastroesophageal reflux disease)    occasional; OTC as needed  . 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  . Hypertrophic cardiomyopathy ([place])   . Palpitations   . Trigger finger of left hand 09/2012   left long finger    Patient Active Problem List   Diagnosis Date Noted  . Healthcare maintenance 11/04/2018  . Left-sided headache 12/25/2017  . LLQ pain 11/27/2017  . Neck pain 11/27/2017  . Aortic atherosclerosis ([place]) 09/30/2017  . Advance care planning 06/07/2015  . Achilles tendonitis 09/15/2013  . Balance problem 03/19/2013  . Rash 07/10/2012  . History of hepatitis C 05/29/2011  . Mitral and aortic regurgitation 01/24/2011  . Medicare annual wellness visit, subsequent 01/12/2011  . LVH (left ventricular hypertrophy) 05/03/2009  . DIVERTICULITIS OF COLON 03/02/2009  . External hemorrhoids 10/24/2008  . HYPERCHOLESTEROLEMIA 10/07/2006  . Essential hypertension 12/06/2005  . Hyperglycemia 12/06/2005    Past Surgical History:  Procedure Laterality Date  . BREAST REDUCTION SURGERY Bilateral 2001  . CHOLECYSTECTOMY   07/17/2011   Procedure: LAPAROSCOPIC CHOLECYSTECTOMY WITH INTRAOPERATIVE CHOLANGIOGRAM;  Surgeon: Haywood Lasso, MD;  Location: [facility];  Service: General;  Laterality: N/A;  . FOOT GANGLION EXCISION Right   . [place]  . LACERATION REPAIR Right 1996   thumb  . MYOMECTOMY  10/09/99   x 2  . OOPHORECTOMY Right 1980  . OOPHORECTOMY Left 2000  . OVARIAN CYST DRAINAGE  1980  . PARTIAL HYSTERECTOMY  2002   with no remaining ovary  . TRIGGER FINGER RELEASE Left 09/28/2012   Procedure: RELEASE TRIGGER FINGER/A-1 PULLEY;  Surgeon: Jolyn Nap, MD;  Location: [facility];  Service: Orthopedics;  Laterality: Left;     OB History    Gravida      Para      Term      Preterm      AB      Living  2     SAB      TAB      Ectopic      Multiple      Live Births               Home Medications    Prior to Admission medications   Medication Sig Start Date End Date Taking? Authorizing Provider  b complex vitamins tablet Take 1 tablet by mouth daily.   Yes [provider]  ketoconazole (NIZORAL) 2 % cream Apply 1 application topically  daily. 01/04/19  Yes Tonia Ghent, MD  verapamil (CALAN-SR) 240 MG CR tablet Take 1 tablet (240 mg total) by mouth daily. 11/03/18  Yes Tonia Ghent, MD  pravastatin (PRAVACHOL) 10 MG tablet Take 1 tablet (10 mg total) by mouth every Monday, Wednesday, and Friday. 12/18/18   Tonia Ghent, MD    Family History Family History  Problem Relation Age of Onset  . Colon cancer Father   . Hepatitis C Brother   . Diabetes Sister   . Hypertension Brother   . Hepatitis C Brother   . Colon cancer Paternal Aunt   . Breast cancer Paternal Aunt   . Lung cancer Paternal Uncle   . Breast cancer Cousin   . Liver cancer Cousin   . Colon cancer Other        Aunt    Social History Social History   Tobacco Use  . Smoking status: Former Smoker    Packs/day: 2.50    Years: 42.00    Pack years: 105.00     Types: Cigarettes    Quit date: 07/07/2005    Years since quitting: 13.7  . Smokeless tobacco: Never Used  . Tobacco comment: quit smoking 2008  Substance Use Topics  . Alcohol use: Yes    Alcohol/week: 0.0 standard drinks    Comment: a few glasses a wine a week  . Drug use: No    Comment: previous hx. of     Allergies   Morphine sulfate, Penicillins, and Acyclovir and related   Review of Systems Review of Systems  HENT: Positive for tinnitus. Negative for ear pain.   Neurological: Negative for dizziness and headaches.     Physical Exam Updated Vital Signs BP (!) 144/83 (BP Location: Left Arm)   Pulse 67   Temp 98.9 F (37.2 C) (Oral)   Resp 15   Ht 5\' 5"  (1.651 m)   Wt 83.5 kg   SpO2 100%   BMI 30.62 kg/m   Physical Exam Vitals signs and nursing note reviewed.  Constitutional:      Appearance: She is well-developed.  HENT:     Head: Normocephalic and atraumatic.     Right Ear: Tympanic membrane, ear canal and external ear normal. No tenderness. No mastoid tenderness.     Left Ear: Tympanic membrane, ear canal and external ear normal.     Nose: Nose normal.  Eyes:     General:        Right eye: No discharge.        Left eye: No discharge.     Extraocular Movements: Extraocular movements intact.     Pupils: Pupils are equal, round, and reactive to light.  Neck:   Cardiovascular:     Rate and Rhythm: Normal rate and regular rhythm.     Heart sounds: Normal heart sounds.  Pulmonary:     Effort: Pulmonary effort is normal.     Breath sounds: Normal breath sounds.  Abdominal:     Palpations: Abdomen is soft.     Tenderness: There is no abdominal tenderness.  Skin:    General: Skin is warm and dry.  Neurological:     Mental Status: She is alert.     Comments: CN 3-12 grossly intact  Psychiatric:        Mood and Affect: Mood is not anxious.      ED Treatments / Results  Labs (all labs ordered are listed, but only abnormal results are displayed)  Labs Reviewed -  No data to display  EKG None  Radiology No results found.  Procedures Procedures (including critical care time)  Medications Ordered in ED Medications - No data to display   Initial Impression / Assessment and Plan / ED Course  I have reviewed the triage vital signs and the nursing notes.  Pertinent labs & imaging results that were available during my care of the patient were reviewed by me and considered in my medical decision making (see chart for details).        Unclear cause of the patient's tinnitus.  She overall appears well.  Exam is unremarkable.  It seems to come and go without obvious cause and does not seem pulsatile like a vascular cause.  Appears stable for outpatient referral to ENT.  She has not been taking any aspirin or other obvious ototoxic agents that could cause this.  Final Clinical Impressions(s) / ED Diagnoses   Final diagnoses:  Tinnitus of right ear    ED Discharge Orders    None       Sherwood Gambler, MD 04/04/19 [phone]

## 2019-04-05 ENCOUNTER — Encounter: Payer: Self-pay | Admitting: Family Medicine

## 2019-04-13 ENCOUNTER — Other Ambulatory Visit: Payer: Self-pay

## 2019-04-13 ENCOUNTER — Encounter: Payer: Self-pay | Admitting: Family Medicine

## 2019-04-13 ENCOUNTER — Ambulatory Visit (INDEPENDENT_AMBULATORY_CARE_PROVIDER_SITE_OTHER): Payer: Medicare HMO | Admitting: Family Medicine

## 2019-04-13 VITALS — BP 106/70 | HR 68 | Temp 97.6°F | Ht 65.0 in | Wt 182.2 lb

## 2019-04-13 DIAGNOSIS — R6889 Other general symptoms and signs: Secondary | ICD-10-CM

## 2019-04-13 DIAGNOSIS — J302 Other seasonal allergic rhinitis: Secondary | ICD-10-CM

## 2019-04-13 DIAGNOSIS — E7800 Pure hypercholesterolemia, unspecified: Secondary | ICD-10-CM | POA: Diagnosis not present

## 2019-04-13 MED ORDER — PRAVASTATIN SODIUM 10 MG PO TABS: 10.0000 mg | ORAL_TABLET | ORAL | Status: DC

## 2019-04-13 NOTE — Progress Notes (Signed)
She is on pravastatin.  She had some aches in the last few weeks.  It could be from statin or from more exercise, d/w pt.  Her sister can only take statin MWF, see avs   She felt a difference in her L eyelid last week but still had normal appearance w/o lid droop.  No symptoms now.  She had some dec in neck pain on L side, better now, but not resolved.  Has been stretching and using ice and heat.  Ice seemed to help the most.  See above re: statin.    She has some throat irritation in the fall that resolves with claritin.  Advised not to take claritin D.  D/w pt.    She woke up initially with humming in her head, resolved after about 2 hours.  This was a few months ago.  Now with vibration sensation in the R ear.  No pain.  No balance troubles.  Not dizzy.  No L ear sx. ear symptoms stopped after ER eval.    She did not recall intolerance to acyclovir and I told her I would look back through her records.  It was currently listed as an allergy.  Meds, vitals, and allergies reviewed.   ROS: Per HPI unless specifically indicated in ROS section   GEN: nad, alert and oriented HEENT: PERRL, EOMI,  No eyelid droop noted.   Tympanic membranes within normal is bilaterally  NECK: supple w/o LA CV: rrr.  PULM: ctab, no inc wob ABD: soft, +bs EXT: no edema SKIN: no acute rash Skin well perfused.

## 2019-04-13 NOTE — Patient Instructions (Addendum)
Stop pravastatin for 1 week.  If the aches stop, then try it weekly.   Update me as needed.  Take care.  Glad to see you.

## 2019-04-14 DIAGNOSIS — J302 Other seasonal allergic rhinitis: Secondary | ICD-10-CM | POA: Insufficient documentation

## 2019-04-14 DIAGNOSIS — R6889 Other general symptoms and signs: Secondary | ICD-10-CM | POA: Insufficient documentation

## 2019-04-14 NOTE — Assessment & Plan Note (Signed)
She has some throat irritation in the fall that resolves with claritin.  Advised not to take claritin D.  D/w pt.

## 2019-04-14 NOTE — Assessment & Plan Note (Signed)
Discussed options.  Would stop statin for 1 week and then try to restarted taking it once weekly to see if she can tolerate that.  She will see if it makes a difference in her muscle aches.  She will update me as needed.  See after visit summary.  She agrees with plan.

## 2019-04-14 NOTE — Assessment & Plan Note (Signed)
Her symptoms have resolved in the meantime.  It is unclear to me if this is tenderness or an episodic tensor tympani syndrome.  Discussed.  She has an unremarkable exam at this point.  I would have her observe her symptoms in the meantime and update me as needed.

## 2019-04-19 ENCOUNTER — Encounter: Payer: Self-pay | Admitting: Family Medicine

## 2019-04-19 ENCOUNTER — Telehealth: Payer: Self-pay | Admitting: Family Medicine

## 2019-04-19 NOTE — Telephone Encounter (Signed)
Please update patient.  I checked back through her records and I do not see where she has ever been on acyclovir.  I removed it from her allergy list.  Thanks.

## 2019-04-19 NOTE — Telephone Encounter (Signed)
Patient notified acyclovir was removed.

## 2019-05-07 DIAGNOSIS — M546 Pain in thoracic spine: Secondary | ICD-10-CM | POA: Diagnosis not present

## 2019-05-07 DIAGNOSIS — M9902 Segmental and somatic dysfunction of thoracic region: Secondary | ICD-10-CM | POA: Diagnosis not present

## 2019-05-07 DIAGNOSIS — M5413 Radiculopathy, cervicothoracic region: Secondary | ICD-10-CM | POA: Diagnosis not present

## 2019-05-07 DIAGNOSIS — M9901 Segmental and somatic dysfunction of cervical region: Secondary | ICD-10-CM | POA: Diagnosis not present

## 2019-05-10 DIAGNOSIS — M9902 Segmental and somatic dysfunction of thoracic region: Secondary | ICD-10-CM | POA: Diagnosis not present

## 2019-05-10 DIAGNOSIS — M546 Pain in thoracic spine: Secondary | ICD-10-CM | POA: Diagnosis not present

## 2019-05-10 DIAGNOSIS — M5413 Radiculopathy, cervicothoracic region: Secondary | ICD-10-CM | POA: Diagnosis not present

## 2019-05-10 DIAGNOSIS — M9901 Segmental and somatic dysfunction of cervical region: Secondary | ICD-10-CM | POA: Diagnosis not present

## 2019-05-12 DIAGNOSIS — M9901 Segmental and somatic dysfunction of cervical region: Secondary | ICD-10-CM | POA: Diagnosis not present

## 2019-05-12 DIAGNOSIS — M5413 Radiculopathy, cervicothoracic region: Secondary | ICD-10-CM | POA: Diagnosis not present

## 2019-05-12 DIAGNOSIS — M9902 Segmental and somatic dysfunction of thoracic region: Secondary | ICD-10-CM | POA: Diagnosis not present

## 2019-05-12 DIAGNOSIS — M546 Pain in thoracic spine: Secondary | ICD-10-CM | POA: Diagnosis not present

## 2019-05-13 ENCOUNTER — Other Ambulatory Visit: Payer: Medicare HMO

## 2019-05-17 DIAGNOSIS — M5413 Radiculopathy, cervicothoracic region: Secondary | ICD-10-CM | POA: Diagnosis not present

## 2019-05-17 DIAGNOSIS — M9901 Segmental and somatic dysfunction of cervical region: Secondary | ICD-10-CM | POA: Diagnosis not present

## 2019-05-17 DIAGNOSIS — M546 Pain in thoracic spine: Secondary | ICD-10-CM | POA: Diagnosis not present

## 2019-05-17 DIAGNOSIS — M9902 Segmental and somatic dysfunction of thoracic region: Secondary | ICD-10-CM | POA: Diagnosis not present

## 2019-05-19 DIAGNOSIS — M546 Pain in thoracic spine: Secondary | ICD-10-CM | POA: Diagnosis not present

## 2019-05-19 DIAGNOSIS — M9901 Segmental and somatic dysfunction of cervical region: Secondary | ICD-10-CM | POA: Diagnosis not present

## 2019-05-19 DIAGNOSIS — M5413 Radiculopathy, cervicothoracic region: Secondary | ICD-10-CM | POA: Diagnosis not present

## 2019-05-19 DIAGNOSIS — M9902 Segmental and somatic dysfunction of thoracic region: Secondary | ICD-10-CM | POA: Diagnosis not present

## 2019-05-24 ENCOUNTER — Telehealth: Payer: Self-pay | Admitting: Family Medicine

## 2019-05-24 DIAGNOSIS — R6889 Other general symptoms and signs: Secondary | ICD-10-CM

## 2019-05-24 NOTE — Telephone Encounter (Signed)
Patient called.  Patient was seen a month ago for a noise and vibration in her ear.  Patient said it's gotten worse and would like to be referred to a specialist.  Patient prefers [facility].  Patient can go anytime.

## 2019-05-25 NOTE — Telephone Encounter (Signed)
I put in the referral.  Thanks.  

## 2019-05-26 DIAGNOSIS — M5413 Radiculopathy, cervicothoracic region: Secondary | ICD-10-CM | POA: Diagnosis not present

## 2019-05-26 DIAGNOSIS — M546 Pain in thoracic spine: Secondary | ICD-10-CM | POA: Diagnosis not present

## 2019-05-26 DIAGNOSIS — M9902 Segmental and somatic dysfunction of thoracic region: Secondary | ICD-10-CM | POA: Diagnosis not present

## 2019-05-26 DIAGNOSIS — M9901 Segmental and somatic dysfunction of cervical region: Secondary | ICD-10-CM | POA: Diagnosis not present

## 2019-06-07 DIAGNOSIS — H6983 Other specified disorders of Eustachian tube, bilateral: Secondary | ICD-10-CM | POA: Diagnosis not present

## 2019-06-07 DIAGNOSIS — H9311 Tinnitus, right ear: Secondary | ICD-10-CM | POA: Diagnosis not present

## 2019-06-07 DIAGNOSIS — H9313 Tinnitus, bilateral: Secondary | ICD-10-CM | POA: Diagnosis not present

## 2019-06-07 DIAGNOSIS — H903 Sensorineural hearing loss, bilateral: Secondary | ICD-10-CM | POA: Diagnosis not present

## 2019-06-07 DIAGNOSIS — H9190 Unspecified hearing loss, unspecified ear: Secondary | ICD-10-CM | POA: Diagnosis not present

## 2019-06-09 DIAGNOSIS — M9902 Segmental and somatic dysfunction of thoracic region: Secondary | ICD-10-CM | POA: Diagnosis not present

## 2019-06-09 DIAGNOSIS — M546 Pain in thoracic spine: Secondary | ICD-10-CM | POA: Diagnosis not present

## 2019-06-09 DIAGNOSIS — M9901 Segmental and somatic dysfunction of cervical region: Secondary | ICD-10-CM | POA: Diagnosis not present

## 2019-06-09 DIAGNOSIS — M5413 Radiculopathy, cervicothoracic region: Secondary | ICD-10-CM | POA: Diagnosis not present

## 2019-07-04 ENCOUNTER — Other Ambulatory Visit: Payer: Self-pay | Admitting: Family Medicine

## 2019-07-04 DIAGNOSIS — E7800 Pure hypercholesterolemia, unspecified: Secondary | ICD-10-CM

## 2019-07-07 ENCOUNTER — Telehealth: Payer: Self-pay

## 2019-07-07 NOTE — Telephone Encounter (Signed)
LVM w COVID screen and back lab info 12.30.2020 TLJ

## 2019-07-12 ENCOUNTER — Other Ambulatory Visit: Payer: Self-pay

## 2019-07-12 ENCOUNTER — Other Ambulatory Visit (INDEPENDENT_AMBULATORY_CARE_PROVIDER_SITE_OTHER): Payer: Medicare HMO

## 2019-07-12 DIAGNOSIS — E7800 Pure hypercholesterolemia, unspecified: Secondary | ICD-10-CM

## 2019-07-12 LAB — LIPID PANEL
Cholesterol: 191 mg/dL (ref 0–200)
HDL: 44.5 mg/dL (ref >39.00)
LDL Cholesterol: 128 mg/dL — ABNORMAL HIGH (ref 0–99)
NonHDL: 146.68
Total CHOL/HDL Ratio: 4
Triglycerides: 93 mg/dL (ref 0.0–149.0)
VLDL: 18.6 mg/dL (ref 0.0–40.0)

## 2019-07-14 DIAGNOSIS — M5413 Radiculopathy, cervicothoracic region: Secondary | ICD-10-CM | POA: Diagnosis not present

## 2019-07-14 DIAGNOSIS — M546 Pain in thoracic spine: Secondary | ICD-10-CM | POA: Diagnosis not present

## 2019-07-14 DIAGNOSIS — M9901 Segmental and somatic dysfunction of cervical region: Secondary | ICD-10-CM | POA: Diagnosis not present

## 2019-07-14 DIAGNOSIS — M9902 Segmental and somatic dysfunction of thoracic region: Secondary | ICD-10-CM | POA: Diagnosis not present

## 2019-07-20 ENCOUNTER — Other Ambulatory Visit: Payer: Self-pay | Admitting: Family Medicine

## 2019-07-20 DIAGNOSIS — E7800 Pure hypercholesterolemia, unspecified: Secondary | ICD-10-CM

## 2019-07-20 MED ORDER — PRAVASTATIN SODIUM 10 MG PO TABS: 10.0000 mg | ORAL_TABLET | ORAL | Status: DC

## 2019-08-11 ENCOUNTER — Ambulatory Visit (INDEPENDENT_AMBULATORY_CARE_PROVIDER_SITE_OTHER)
Admission: RE | Admit: 2019-08-11 | Discharge: 2019-08-11 | Disposition: A | Discharge status: Home / Self Care | Payer: Medicare HMO | Source: Ambulatory Visit | Attending: Acute Care | Admitting: Acute Care

## 2019-08-11 ENCOUNTER — Other Ambulatory Visit: Payer: Self-pay

## 2019-08-11 DIAGNOSIS — Z87891 Personal history of nicotine dependence: Secondary | ICD-10-CM | POA: Diagnosis not present

## 2019-08-11 DIAGNOSIS — Z122 Encounter for screening for malignant neoplasm of respiratory organs: Secondary | ICD-10-CM

## 2019-08-12 NOTE — Progress Notes (Signed)
Please call patient and let them  know their  low dose Ct was read as a Lung RADS 2: nodules that are benign in appearance and behavior with a very low likelihood of becoming a clinically active cancer due to size or lack of growth. Recommendation per radiology is for a repeat LDCT in 12 months. Please let them  know we will order and schedule their  annual screening scan for 08/2020. Please let them  know there was notation of CAD on their  scan.  Please remind the patient  that this is a non-gated exam therefore degree or severity of disease  cannot be determined. Please have them  follow up with their PCP regarding potential risk factor modification, dietary therapy or pharmacologic therapy if clinically indicated. Pt.  is  currently on statin therapy. Please place order for annual  screening scan for  08/2020 and fax results to PCP. Thanks so much. 

## 2019-08-16 NOTE — Telephone Encounter (Signed)
I called and spoke with the patient.  I was trying to get a follow up coronary CT for an abnormal stress test.  However, this cannot be done until 2/17.  When I called to discuss this she reported chest pain last night that was severe and woke her from her sleep.  She walked around and it went away after a few hours.  I think the possibility that this represents unstable angina and an unstable coronary plaque is relatively high.  I have suggested that she present for admission tonight with plans for an elective cath tomorrow morning.  She would agree to this. She would need ASA, heparin, beta blocker and cath in AM.  If we cannot arrange direct admission she will need to come through the ED.

## 2019-08-17 ENCOUNTER — Telehealth: Payer: Self-pay

## 2019-08-17 ENCOUNTER — Inpatient Hospital Stay (HOSPITAL_COMMUNITY): Admission: AD | Admit: 2019-08-17 | Payer: Medicare HMO | Source: Ambulatory Visit | Admitting: Cardiology

## 2019-08-17 NOTE — Telephone Encounter (Signed)
Dr. Percival Spanish recommends patient being admitted to hospital due to unstable angina. Spoke with bed control, they are preparing a bed for patient. Called patient to update, unable to get a response. Voicemail left.

## 2019-08-17 NOTE — Telephone Encounter (Signed)
Spoke with patient. Notes were sent on the wrong patient. Patient is not to be admitted to hospital. Bed control and patient notified.

## 2019-08-25 DIAGNOSIS — M9902 Segmental and somatic dysfunction of thoracic region: Secondary | ICD-10-CM | POA: Diagnosis not present

## 2019-08-25 DIAGNOSIS — M546 Pain in thoracic spine: Secondary | ICD-10-CM | POA: Diagnosis not present

## 2019-08-25 DIAGNOSIS — M5413 Radiculopathy, cervicothoracic region: Secondary | ICD-10-CM | POA: Diagnosis not present

## 2019-08-25 DIAGNOSIS — M9901 Segmental and somatic dysfunction of cervical region: Secondary | ICD-10-CM | POA: Diagnosis not present

## 2019-09-20 ENCOUNTER — Other Ambulatory Visit: Payer: Self-pay

## 2019-09-20 ENCOUNTER — Other Ambulatory Visit (INDEPENDENT_AMBULATORY_CARE_PROVIDER_SITE_OTHER): Payer: Medicare HMO

## 2019-09-20 DIAGNOSIS — E7800 Pure hypercholesterolemia, unspecified: Secondary | ICD-10-CM | POA: Diagnosis not present

## 2019-09-20 LAB — LIPID PANEL
Cholesterol: 196 mg/dL (ref 0–200)
HDL: 48.2 mg/dL (ref >39.00)
LDL Cholesterol: 125 mg/dL — ABNORMAL HIGH (ref 0–99)
NonHDL: 147.69
Total CHOL/HDL Ratio: 4
Triglycerides: 114 mg/dL (ref 0.0–149.0)
VLDL: 22.8 mg/dL (ref 0.0–40.0)

## 2019-09-24 DIAGNOSIS — R5381 Other malaise: Secondary | ICD-10-CM | POA: Diagnosis not present

## 2019-09-24 DIAGNOSIS — S0990XA Unspecified injury of head, initial encounter: Secondary | ICD-10-CM | POA: Diagnosis not present

## 2019-09-27 ENCOUNTER — Other Ambulatory Visit: Payer: Self-pay

## 2019-09-27 ENCOUNTER — Ambulatory Visit (INDEPENDENT_AMBULATORY_CARE_PROVIDER_SITE_OTHER): Payer: Medicare HMO | Admitting: Family Medicine

## 2019-09-27 ENCOUNTER — Encounter: Payer: Self-pay | Admitting: Family Medicine

## 2019-09-27 VITALS — BP 122/70 | HR 73 | Temp 97.6°F | Ht 65.0 in | Wt 182.1 lb

## 2019-09-27 DIAGNOSIS — G459 Transient cerebral ischemic attack, unspecified: Secondary | ICD-10-CM | POA: Diagnosis not present

## 2019-09-27 DIAGNOSIS — E7800 Pure hypercholesterolemia, unspecified: Secondary | ICD-10-CM | POA: Diagnosis not present

## 2019-09-27 MED ORDER — ASPIRIN EC 81 MG PO TBEC: 81.0000 mg | DELAYED_RELEASE_TABLET | Freq: Every day | ORAL | Status: DC

## 2019-09-27 MED ORDER — PRAVASTATIN SODIUM 10 MG PO TABS: 10.0000 mg | ORAL_TABLET | ORAL | Status: DC

## 2019-09-27 NOTE — Progress Notes (Signed)
This visit occurred during the SARS-CoV-2 public health emergency.  Safety protocols were in place, including screening questions prior to the visit, additional usage of staff PPE, and extensive cleaning of exam room while observing appropriate contact time as indicated for disinfecting solutions.  She had more joint aches, R arch pain, since going up on pravastatin to 10mg  MWF.  She has had some diffuse aches since starting statin.  We talked about lipid clinic referral.  See avs.    Thursday AM, 09/23/19.   She has a vision/shimmering in the L eye.  It followed with eye tracking.  Only in the L eye with this episode.  She had L lip and L arm tingling.  Her head felt "cloudy."  Lasted 10 minutes and it resolved.  Checked at the house by EMS.  No residual sx.  No weakness.  No ER eval.    Had similar episode in R eye only previously.  No residual symptoms.  Discussed.  No weakness now.  No ongoing symptoms now.  She is clearly out of the window for any acute treatment at time of presentation.  She thought she pulled a muscle moving her wood splitter.  This was back in 07/2019.  She used used heat and sports cream.  She has persistent L SCM area pain.  She doesn't have chest pain but has been having persistent pain in the area (witih continued lifting).    She wanted to transfer to one of the clinics closer to her home and she would like to specifically see Dr. Olivia Mackie McLean-Scocuzza.  We talked about this and she had been happy with the care here and I have always been glad to see the patient.  She told me she was not upset and I told her about the transfer protocol.  She will check with Dr. McLean-Scocuzza's clinic in the meantime.  I am happy to do whatever is best for the patient.  Meds, vitals, and allergies reviewed.   ROS: Per HPI unless specifically indicated in ROS section   GEN: nad, alert and oriented HEENT: ncat, no facial droop. NECK: supple w/o LA, tender along left sternocleidomastoid.,   No carotid bruit heard. CV: rrr.  no murmur PULM: ctab, no inc wob ABD: soft, +bs EXT: no edema SKIN: no acute rash CN 2-12 wnl B, S/S wnl x4

## 2019-09-27 NOTE — Patient Instructions (Addendum)
Cut back on the pravastatin to once a week.  I want you to go to the lipid clinic to see about other options.  Let me know about the transfer when that needs to happen.  We'll work on your other tests in the meantime.   Go to the lab on the way out.   If you have mychart we'll likely use that to update you.    If you have another episode, then go to the ER or dial 911.    Take care.  Glad to see you.

## 2019-09-28 ENCOUNTER — Telehealth: Payer: Self-pay | Admitting: Cardiology

## 2019-09-28 LAB — CBC WITH DIFFERENTIAL/PLATELET
Basophils Absolute: 0.1 10*3/uL (ref 0.0–0.1)
Basophils Relative: 1.7 % (ref 0.0–3.0)
Eosinophils Absolute: 0.1 10*3/uL (ref 0.0–0.7)
Eosinophils Relative: 2.5 % (ref 0.0–5.0)
HCT: 38.6 % (ref 36.0–46.0)
Hemoglobin: 12.6 g/dL (ref 12.0–15.0)
Lymphocytes Relative: 32.7 % (ref 12.0–46.0)
Lymphs Abs: 1.5 10*3/uL (ref 0.7–4.0)
MCHC: 32.6 g/dL (ref 30.0–36.0)
MCV: 85.1 fl (ref 78.0–100.0)
Monocytes Absolute: 0.4 10*3/uL (ref 0.1–1.0)
Monocytes Relative: 9.5 % (ref 3.0–12.0)
Neutro Abs: 2.5 10*3/uL (ref 1.4–7.7)
Neutrophils Relative %: 53.6 % (ref 43.0–77.0)
Platelets: 195 10*3/uL (ref 150.0–400.0)
RBC: 4.54 Mil/uL (ref 3.87–5.11)
RDW: 13.4 % (ref 11.5–15.5)
WBC: 4.7 10*3/uL (ref 4.0–10.5)

## 2019-09-28 LAB — COMPREHENSIVE METABOLIC PANEL
ALT: 13 U/L (ref 0–35)
AST: 16 U/L (ref 0–37)
Albumin: 4.4 g/dL (ref 3.5–5.2)
Alkaline Phosphatase: 59 U/L (ref 39–117)
BUN: 9 mg/dL (ref 6–23)
CO2: 31 mEq/L (ref 19–32)
Calcium: 9.9 mg/dL (ref 8.4–10.5)
Chloride: 104 mEq/L (ref 96–112)
Creatinine, Ser: 0.66 mg/dL (ref 0.40–1.20)
GFR: 106.97 mL/min (ref >60.00)
Glucose, Bld: 82 mg/dL (ref 70–99)
Potassium: 4.3 mEq/L (ref 3.5–5.1)
Sodium: 138 mEq/L (ref 135–145)
Total Bilirubin: 0.7 mg/dL (ref 0.2–1.2)
Total Protein: 7.4 g/dL (ref 6.0–8.3)

## 2019-09-28 NOTE — Telephone Encounter (Signed)
Patient called about referral to see the advance lipid clinic - routed to [facility] for [place].

## 2019-09-29 DIAGNOSIS — G459 Transient cerebral ischemic attack, unspecified: Secondary | ICD-10-CM | POA: Insufficient documentation

## 2019-09-29 NOTE — Assessment & Plan Note (Addendum)
It looks like she has an incidental sternocleidomastoid strain at this point but the bigger concern is a presumed TIA.  Rationale and pathophysiology discussed with patient.  Check echo, carotid Doppler, CT head.  Refer to lipid clinic given her likely statin intolerance as described above.  No residual neurologic symptoms.  If she has any new symptoms then I want her to go to the emergency room.  She understood.  I will route this to cardiology as FYI.  See above about potential transfer.  I have always been glad to see this pleasant patient.  She is still okay for outpatient follow-up at this point.  Start aspirin 81 mg a day.  At least 30 minutes were devoted to patient care in this encounter (this can potentially include time spent reviewing the patient's file/history, interviewing and examining the patient, counseling/reviewing plan with patient, ordering referrals, ordering tests, reviewing relevant laboratory or x-ray data, and documenting the encounter).

## 2019-09-29 NOTE — Telephone Encounter (Signed)
Patient returning Yvette's call, re-routed to [facility] for scheduling.

## 2019-09-30 ENCOUNTER — Ambulatory Visit
Admission: RE | Admit: 2019-09-30 | Discharge: 2019-09-30 | Disposition: A | Discharge status: Home / Self Care | Payer: Medicare HMO | Source: Ambulatory Visit | Attending: Family Medicine | Admitting: Family Medicine

## 2019-09-30 ENCOUNTER — Other Ambulatory Visit: Payer: Self-pay

## 2019-09-30 DIAGNOSIS — E7800 Pure hypercholesterolemia, unspecified: Secondary | ICD-10-CM | POA: Diagnosis not present

## 2019-09-30 DIAGNOSIS — G459 Transient cerebral ischemic attack, unspecified: Secondary | ICD-10-CM | POA: Diagnosis not present

## 2019-10-05 ENCOUNTER — Ambulatory Visit: Payer: Medicare HMO | Admitting: Family Medicine

## 2019-10-07 ENCOUNTER — Ambulatory Visit: Payer: Medicare HMO | Admitting: Family Medicine

## 2019-10-21 ENCOUNTER — Other Ambulatory Visit: Payer: Self-pay | Admitting: Family Medicine

## 2019-10-27 ENCOUNTER — Other Ambulatory Visit: Payer: Self-pay

## 2019-10-27 ENCOUNTER — Ambulatory Visit: Payer: Medicare HMO | Admitting: Internal Medicine

## 2019-10-27 ENCOUNTER — Encounter: Payer: Self-pay | Admitting: Internal Medicine

## 2019-10-27 VITALS — BP 116/76 | HR 72 | Ht 65.0 in | Wt 188.2 lb

## 2019-10-27 DIAGNOSIS — I2510 Atherosclerotic heart disease of native coronary artery without angina pectoris: Secondary | ICD-10-CM

## 2019-10-27 DIAGNOSIS — E785 Hyperlipidemia, unspecified: Secondary | ICD-10-CM | POA: Diagnosis not present

## 2019-10-27 DIAGNOSIS — I700 Atherosclerosis of aorta: Secondary | ICD-10-CM

## 2019-10-27 NOTE — Patient Instructions (Addendum)
Medication Instructions:  Dr. Debara Pickett recommends pitavastatin (Livalo) 2mg  daily for cholesterol   *If you need a refill on your cardiac medications before your next appointment, please call your pharmacy*  Follow-Up: At [facility], you and your health needs are our priority.  As part of our continuing mission to provide you with exceptional heart care, we have created designated Provider Care Teams.  These Care Teams include your primary Cardiologist (physician) and Advanced Practice Providers (APPs -  Physician Assistants and Nurse Practitioners) who all work together to provide you with the care you need, when you need it.  We recommend signing up for the patient portal called "MyChart".  Sign up information is provided on this After Visit Summary.  MyChart is used to connect with patients for Virtual Visits (Telemedicine).  Patients are able to view lab/test results, encounter notes, upcoming appointments, etc.  Non-urgent messages can be sent to your provider as well.   To learn more about what you can do with MyChart, go to [email or web address].    Your next appointment:   As needed with Dr. Debara Pickett  Please contact our office if you wish to start a new medication and if so, we will arrange for labs & follow up after

## 2019-10-27 NOTE — Progress Notes (Signed)
Yes.  We corrected this on subsequent notes.

## 2019-10-27 NOTE — Progress Notes (Signed)
LIPID CLINIC CONSULT NOTE  Chief Complaint:  Manage dyslipidemia  Primary Care Physician: Patricia Ghent, MD  Primary Cardiologist:  No primary care provider on file.  HPI:  [name] is a 69 y.o. female who is being seen today for the evaluation of dyslipidemia at the request of [name], Patricia Rising, MD.  This is a 69 year old female who was kindly referred for evaluation and management of dyslipidemia.  She is followed by [name] for history of hypertrophic cardiomyopathy, has had hepatitis C which was treated and cured in 2016, also a history of palpitations and dyslipidemia but was intolerant to statins.  She was previously on pravastatin 10 mg 3 times a week but she only noted a three-point reduction in her cholesterol and felt that it was not of any benefit to take the medicine because she had some joint aches associated with it.  She did have a CT for lung cancer screening in February 2021.  This demonstrated benign appearance of the lung however there was three-vessel coronary calcification noted.  Based on this, her target LDL is less than 70.  Recent labs show total cholesterol 196, triglycerides 114, HDL 48 and LDL 125.  This was on 3 times weekly pravastatin.  PMHx:  Past Medical History:  Diagnosis Date  . Diverticulosis   . GERD (gastroesophageal reflux disease)    occasional; OTC as needed  . 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  . Hypertrophic cardiomyopathy ([place])   . Palpitations   . Trigger finger of left hand 09/2012   left long finger    Past Surgical History:  Procedure Laterality Date  . BREAST REDUCTION SURGERY Bilateral 2001  . CHOLECYSTECTOMY  07/17/2011   Procedure: LAPAROSCOPIC CHOLECYSTECTOMY WITH INTRAOPERATIVE CHOLANGIOGRAM;  Surgeon: Haywood Lasso, MD;  Location: [facility];  Service: General;  Laterality: N/A;  . FOOT GANGLION EXCISION Right   . [place]  . LACERATION REPAIR Right 1996   thumb  . MYOMECTOMY   10/09/99   x 2  . OOPHORECTOMY Right 1980  . OOPHORECTOMY Left 2000  . OVARIAN CYST DRAINAGE  1980  . PARTIAL HYSTERECTOMY  2002   with no remaining ovary  . TRIGGER FINGER RELEASE Left 09/28/2012   Procedure: RELEASE TRIGGER FINGER/A-1 PULLEY;  Surgeon: Jolyn Nap, MD;  Location: [facility];  Service: Orthopedics;  Laterality: Left;    FAMHx:  Family History  Problem Relation Age of Onset  . Colon cancer Father   . Hepatitis C Brother   . Diabetes Sister   . Hypertension Brother   . Hepatitis C Brother   . Colon cancer Paternal Aunt   . Breast cancer Paternal Aunt   . Lung cancer Paternal Uncle   . Breast cancer Cousin   . Liver cancer Cousin   . Colon cancer Other        Aunt    SOCHx:   reports that she quit smoking about 14 years ago. Her smoking use included cigarettes. She has a 105.00 pack-year smoking history. She has never used smokeless tobacco. She reports current alcohol use. She reports that she does not use drugs.  ALLERGIES:  Allergies  Allergen Reactions  . Morphine Sulfate Swelling  . Penicillins Swelling    ROS: Pertinent items noted in HPI and remainder of comprehensive ROS otherwise negative.  HOME MEDS: Current Outpatient Medications on File Prior to Visit  Medication Sig Dispense Refill  . aspirin  EC 81 MG tablet Take 1 tablet (81 mg total) by mouth daily.    Marland Kitchen b complex vitamins tablet Take 1 tablet by mouth daily.    . verapamil (CALAN-SR) 240 MG CR tablet TAKE 1 TABLET EVERY DAY 90 tablet 3   No current facility-administered medications on file prior to visit.    LABS/IMAGING: No results found for this or any previous visit (from the past 48 hour(s)). No results found.  LIPID PANEL:    Component Value Date/Time   CHOL 196 09/20/2019 1254   TRIG 114.0 09/20/2019 1254   HDL 48.20 09/20/2019 1254   CHOLHDL 4 09/20/2019 1254   VLDL 22.8 09/20/2019 1254   LDLCALC 125 (H) 09/20/2019 1254    WEIGHTS: Wt Readings  from Last 3 Encounters:  10/27/19 188 lb 3.2 oz (85.4 kg)  09/27/19 182 lb 1 oz (82.6 kg)  04/13/19 182 lb 3 oz (82.6 kg)    VITALS: BP 116/76   Pulse 72   Ht 5\' 5"  (1.651 m)   Wt 188 lb 3.2 oz (85.4 kg)   SpO2 98%   BMI 31.32 kg/m   EXAM: Deferred  EKG: Deferred  ASSESSMENT: 1. Mixed dyslipidemia, goal LDL less than 70 2. Three-vessel coronary artery calcification 3. Hypertrophic cardiomyopathy 4. History of hepatitis C-treated and cured  PLAN: 1.   [name] has a mixed dyslipidemia but has been intolerant to pravastatin.  She has three-vessel coronary artery calcification and a target LDL less than 70.  We discussed that she needs to consider alternative statin medications prior to considering PCSK9 inhibitors or other options.  She was concerned particularly about her liver although since she has been treated and cured of hepatitis C, the risk of a statin on worsening liver failure is minimal.  Nonetheless, one option might be Livalo, which is primarily metabolized outside of the liver.  Also, PCSK9 inhibitors tend to not make significant changes in liver enzymes.  She wishes to think a little further about therapy and will contact us back after she has an upcoming echocardiogram and carotid ultrasound which was scheduled on May 5.  Thanks again for the kind referral.  Patricia Casino, MD, FACC, [place] Director of the [facility]  [facility] Diplomate of the American Board of Clinical Lipidology Attending Cardiologist  Direct Dial: [phone]  Fax: [phone]  Website:  [email or web address] 10/27/2019, 9:32 PM

## 2019-11-03 ENCOUNTER — Other Ambulatory Visit: Payer: Self-pay | Admitting: Family Medicine

## 2019-11-03 DIAGNOSIS — G459 Transient cerebral ischemic attack, unspecified: Secondary | ICD-10-CM

## 2019-11-10 ENCOUNTER — Ambulatory Visit (INDEPENDENT_AMBULATORY_CARE_PROVIDER_SITE_OTHER): Payer: Medicare HMO

## 2019-11-10 ENCOUNTER — Other Ambulatory Visit: Payer: Self-pay

## 2019-11-10 DIAGNOSIS — G459 Transient cerebral ischemic attack, unspecified: Secondary | ICD-10-CM | POA: Diagnosis not present

## 2019-11-11 ENCOUNTER — Other Ambulatory Visit: Payer: Self-pay | Admitting: Family Medicine

## 2020-02-15 ENCOUNTER — Emergency Department (HOSPITAL_COMMUNITY)
Admission: EM | Admit: 2020-02-15 | Discharge: 2020-02-15 | Disposition: A | Discharge status: Home / Self Care | Payer: Medicare HMO | Attending: Emergency Medicine | Admitting: Emergency Medicine

## 2020-02-15 ENCOUNTER — Encounter (HOSPITAL_COMMUNITY): Payer: Self-pay | Admitting: Emergency Medicine

## 2020-02-15 ENCOUNTER — Ambulatory Visit (INDEPENDENT_AMBULATORY_CARE_PROVIDER_SITE_OTHER): Payer: Medicare HMO

## 2020-02-15 ENCOUNTER — Other Ambulatory Visit: Payer: Self-pay

## 2020-02-15 DIAGNOSIS — R55 Syncope and collapse: Secondary | ICD-10-CM | POA: Diagnosis not present

## 2020-02-15 DIAGNOSIS — Z0000 Encounter for general adult medical examination without abnormal findings: Secondary | ICD-10-CM

## 2020-02-15 DIAGNOSIS — Z5321 Procedure and treatment not carried out due to patient leaving prior to being seen by health care provider: Secondary | ICD-10-CM | POA: Insufficient documentation

## 2020-02-15 LAB — CBG MONITORING, ED: Glucose-Capillary: 109 mg/dL — ABNORMAL HIGH (ref 70–99)

## 2020-02-15 NOTE — ED Triage Notes (Signed)
Pt complains of a near syncopal episode that happened at 1530. She states the left side of her face and arm began to tingle. Denies LOC , N/V.

## 2020-02-15 NOTE — Patient Instructions (Signed)
[name] , Thank you for taking time to come for your Medicare Wellness Visit. I appreciate your ongoing commitment to your health goals. Please review the following plan we discussed and let me know if I can assist you in the future.   Screening recommendations/referrals: Colonoscopy: Up to date, completed 05/04/2015, due 04/2020 Mammogram: Up to date, completed 03/24/2019, due 03/2020 Bone Density: declined Recommended yearly ophthalmology/optometry visit for glaucoma screening and checkup Recommended yearly dental visit for hygiene and checkup  Vaccinations: Influenza vaccine: due, will be available in the office at the end of August Pneumococcal vaccine: due, will discuss with provider at next visit  Tdap vaccine: Up to date, completed 09/06/2014, due 09/2024 Shingles vaccine: due, check with your insurance regarding coverage   Covid-19:Completed series  Advanced directives: Advance directive discussed with you today. Even though you declined this today please call our office should you change your mind and we can give you the proper paperwork for you to fill out.   Conditions/risks identified: hypertension, hypercholesterolemia  Next appointment: Follow up in one year for your annual wellness visit    Preventive Care 71 Years and Older, Female Preventive care refers to lifestyle choices and visits with your health care provider that can promote health and wellness. What does preventive care include?  A yearly physical exam. This is also called an annual well check.  Dental exams once or twice a year.  Routine eye exams. Ask your health care provider how often you should have your eyes checked.  Personal lifestyle choices, including:  Daily care of your teeth and gums.  Regular physical activity.  Eating a healthy diet.  Avoiding tobacco and drug use.  Limiting alcohol use.  Practicing safe sex.  Taking low-dose aspirin every day.  Taking vitamin and mineral  supplements as recommended by your health care provider. What happens during an annual well check? The services and screenings done by your health care provider during your annual well check will depend on your age, overall health, lifestyle risk factors, and family history of disease. Counseling  Your health care provider may ask you questions about your:  Alcohol use.  Tobacco use.  Drug use.  Emotional well-being.  Home and relationship well-being.  Sexual activity.  Eating habits.  History of falls.  Memory and ability to understand (cognition).  Work and work Statistician.  Reproductive health. Screening  You may have the following tests or measurements:  Height, weight, and BMI.  Blood pressure.  Lipid and cholesterol levels. These may be checked every 5 years, or more frequently if you are over 40 years old.  Skin check.  Lung cancer screening. You may have this screening every year starting at age 71 if you have a 30-pack-year history of smoking and currently smoke or have quit within the past 15 years.  Fecal occult blood test (FOBT) of the stool. You may have this test every year starting at age 71.  Flexible sigmoidoscopy or colonoscopy. You may have a sigmoidoscopy every 5 years or a colonoscopy every 10 years starting at age 71.  Hepatitis C blood test.  Hepatitis B blood test.  Sexually transmitted disease (STD) testing.  Diabetes screening. This is done by checking your blood sugar (glucose) after you have not eaten for a while (fasting). You may have this done every 1-3 years.  Bone density scan. This is done to screen for osteoporosis. You may have this done starting at age 71.  Mammogram. This may be done every 1-2  years. Talk to your health care provider about how often you should have regular mammograms. Talk with your health care provider about your test results, treatment options, and if necessary, the need for more tests. Vaccines  Your  health care provider may recommend certain vaccines, such as:  Influenza vaccine. This is recommended every year.  Tetanus, diphtheria, and acellular pertussis (Tdap, Td) vaccine. You may need a Td booster every 10 years.  Zoster vaccine. You may need this after age 71.  Pneumococcal 13-valent conjugate (PCV13) vaccine. One dose is recommended after age 10.  Pneumococcal polysaccharide (PPSV23) vaccine. One dose is recommended after age 71. Talk to your health care provider about which screenings and vaccines you need and how often you need them. This information is not intended to replace advice given to you by your health care provider. Make sure you discuss any questions you have with your health care provider. Document Released: 07/21/2015 Document Revised: 03/13/2016 Document Reviewed: 04/25/2015 Elsevier Interactive Patient Education  2017 [place] Prevention in the Home Falls can cause injuries. They can happen to people of all ages. There are many things you can do to make your home safe and to help prevent falls. What can I do on the outside of my home?  Regularly fix the edges of walkways and driveways and fix any cracks.  Remove anything that might make you trip as you walk through a door, such as a raised step or threshold.  Trim any bushes or trees on the path to your home.  Use bright outdoor lighting.  Clear any walking paths of anything that might make someone trip, such as rocks or tools.  Regularly check to see if handrails are loose or broken. Make sure that both sides of any steps have handrails.  Any raised decks and porches should have guardrails on the edges.  Have any leaves, snow, or ice cleared regularly.  Use sand or salt on walking paths during winter.  Clean up any spills in your garage right away. This includes oil or grease spills. What can I do in the bathroom?  Use night lights.  Install grab bars by the toilet and in the tub and  shower. Do not use towel bars as grab bars.  Use non-skid mats or decals in the tub or shower.  If you need to sit down in the shower, use a plastic, non-slip stool.  Keep the floor dry. Clean up any water that spills on the floor as soon as it happens.  Remove soap buildup in the tub or shower regularly.  Attach bath mats securely with double-sided non-slip rug tape.  Do not have throw rugs and other things on the floor that can make you trip. What can I do in the bedroom?  Use night lights.  Make sure that you have a light by your bed that is easy to reach.  Do not use any sheets or blankets that are too big for your bed. They should not hang down onto the floor.  Have a firm chair that has side arms. You can use this for support while you get dressed.  Do not have throw rugs and other things on the floor that can make you trip. What can I do in the kitchen?  Clean up any spills right away.  Avoid walking on wet floors.  Keep items that you use a lot in easy-to-reach places.  If you need to reach something above you, use a strong step  stool that has a grab bar.  Keep electrical cords out of the way.  Do not use floor polish or wax that makes floors slippery. If you must use wax, use non-skid floor wax.  Do not have throw rugs and other things on the floor that can make you trip. What can I do with my stairs?  Do not leave any items on the stairs.  Make sure that there are handrails on both sides of the stairs and use them. Fix handrails that are broken or loose. Make sure that handrails are as long as the stairways.  Check any carpeting to make sure that it is firmly attached to the stairs. Fix any carpet that is loose or worn.  Avoid having throw rugs at the top or bottom of the stairs. If you do have throw rugs, attach them to the floor with carpet tape.  Make sure that you have a light switch at the top of the stairs and the bottom of the stairs. If you do not  have them, ask someone to add them for you. What else can I do to help prevent falls?  Wear shoes that:  Do not have high heels.  Have rubber bottoms.  Are comfortable and fit you well.  Are closed at the toe. Do not wear sandals.  If you use a stepladder:  Make sure that it is fully opened. Do not climb a closed stepladder.  Make sure that both sides of the stepladder are locked into place.  Ask someone to hold it for you, if possible.  Clearly mark and make sure that you can see:  Any grab bars or handrails.  First and last steps.  Where the edge of each step is.  Use tools that help you move around (mobility aids) if they are needed. These include:  Canes.  Walkers.  Scooters.  Crutches.  Turn on the lights when you go into a dark area. Replace any light bulbs as soon as they burn out.  Set up your furniture so you have a clear path. Avoid moving your furniture around.  If any of your floors are uneven, fix them.  If there are any pets around you, be aware of where they are.  Review your medicines with your doctor. Some medicines can make you feel dizzy. This can increase your chance of falling. Ask your doctor what other things that you can do to help prevent falls. This information is not intended to replace advice given to you by your health care provider. Make sure you discuss any questions you have with your health care provider. Document Released: 04/20/2009 Document Revised: 11/30/2015 Document Reviewed: 07/29/2014 Elsevier Interactive Patient Education  2017 [employer].

## 2020-02-15 NOTE — Progress Notes (Signed)
PCP notes:  Health Maintenance: Pneumococcal 23- due Dexa- declined   Abnormal Screenings: none   Patient concerns: Abdominal discomfort- "feels like something is running around in my stomach" onset 15 years ago   Nurse concerns: none   Next PCP appt.: none

## 2020-02-15 NOTE — Progress Notes (Signed)
Subjective:   [name] is a 69 y.o. female who presents for Medicare Annual (Subsequent) preventive examination.  Review of Systems: N/A      I connected with the patient today by telephone and verified that I am speaking with the correct person using two identifiers. Location patient: home Location nurse: work Persons participating in the virtual visit: patient, Marine scientist.   I discussed the limitations, risks, security and privacy concerns of performing an evaluation and management service by telephone and the availability of in person appointments. I also discussed with the patient that there may be a patient responsible charge related to this service. The patient expressed understanding and verbally consented to this telephonic visit.    Interactive audio and video telecommunications were attempted between this nurse and patient, however failed, due to patient having technical difficulties OR patient did not have access to video capability.  We continued and completed visit with audio only.     Cardiac Risk Factors include: advanced age (>43men, >34 women);hypertension;Other (see comment), Risk factor comments: hypercholesterolemia     Objective:    Today's Vitals   There is no height or weight on file to calculate BMI.  Advanced Directives 02/15/2020 04/04/2019 06/09/2018 06/30/2016 05/04/2015 09/22/2012 12/26/2011  Does Patient Have a Medical Advance Directive? No No No No No Patient does not have advance directive;Patient would not like information Patient does not have advance directive  Would patient like information on creating a medical advance directive? No - Patient declined - No - Patient declined No - Patient declined - - -  Pre-existing out of facility DNR order (yellow form or pink MOST form) - - - - - - No    Current Medications (verified) Outpatient Encounter Medications as of 02/15/2020  Medication Sig  . aspirin EC 81 MG tablet Take 1 tablet (81 mg total) by  mouth daily.  Marland Kitchen b complex vitamins tablet Take 1 tablet by mouth daily.  . verapamil (CALAN-SR) 240 MG CR tablet TAKE 1 TABLET EVERY DAY  . pravastatin (PRAVACHOL) 10 MG tablet TAKE 1 TABLET (10 MG TOTAL) BY MOUTH EVERY MONDAY, WEDNESDAY, AND FRIDAY. (Patient not taking: Reported on 02/15/2020)   No facility-administered encounter medications on file as of 02/15/2020.    Allergies (verified) Morphine sulfate and Penicillins   History: Past Medical History:  Diagnosis Date  . Diverticulosis   . GERD (gastroesophageal reflux disease)    occasional; OTC as needed  . 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  . Hypertrophic cardiomyopathy ([place])   . Palpitations   . Trigger finger of left hand 09/2012   left long finger   Past Surgical History:  Procedure Laterality Date  . BREAST REDUCTION SURGERY Bilateral 2001  . CHOLECYSTECTOMY  07/17/2011   Procedure: LAPAROSCOPIC CHOLECYSTECTOMY WITH INTRAOPERATIVE CHOLANGIOGRAM;  Surgeon: Haywood Lasso, MD;  Location: [facility];  Service: General;  Laterality: N/A;  . FOOT GANGLION EXCISION Right   . [place]  . LACERATION REPAIR Right 1996   thumb  . MYOMECTOMY  10/09/99   x 2  . OOPHORECTOMY Right 1980  . OOPHORECTOMY Left 2000  . OVARIAN CYST DRAINAGE  1980  . PARTIAL HYSTERECTOMY  2002   with no remaining ovary  . TRIGGER FINGER RELEASE Left 09/28/2012   Procedure: RELEASE TRIGGER FINGER/A-1 PULLEY;  Surgeon: Jolyn Nap, MD;  Location: [facility];  Service: Orthopedics;  Laterality: Left;   Family History  Problem Relation Age  of Onset  . Colon cancer Father   . Hepatitis C Brother   . Diabetes Sister   . Hypertension Brother   . Hepatitis C Brother   . Colon cancer Paternal Aunt   . Breast cancer Paternal Aunt   . Lung cancer Paternal Uncle   . Breast cancer Cousin   . Liver cancer Cousin   . Colon cancer Other        Aunt   Social History   Socioeconomic History  . Marital  status: Single    Spouse name: Not on file  . Number of children: 2  . Years of education: Not on file  . Highest education level: Not on file  Occupational History  . 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 Smoking status: Former Smoker    Packs/day: 2.50    Years: 42.00    Pack years: 105.00    Types: Cigarettes    Quit date: 07/07/2005    Years since quitting: 14.6  . Smokeless tobacco: Never Used  . Tobacco comment: quit smoking 2008  Vaping Use  . Vaping Use: Never used  Substance and Sexual Activity  . Alcohol use: Yes    Alcohol/week: 0.0 standard drinks    Comment: a few glasses a wine a week  . Drug use: No    Comment: previous hx. of  . Sexual activity: Yes    Birth control/protection: Surgical  Other Topics Concern  . Not on file  Social History Narrative   Divorced 1982   2 children   Left handed   Working from home for [employer] as of 2019   Social Determinants of Health   Financial Resource Strain: Low Risk   . Difficulty of Paying Living Expenses: Not hard at all  Food Insecurity: No Food Insecurity  . Worried About Charity fundraiser in the Last Year: Never true  . Ran Out of Food in the Last Year: Never true  Transportation Needs: No Transportation Needs  . Lack of Transportation (Medical): No  . Lack of Transportation (Non-Medical): No  Physical Activity: Sufficiently Active  . Days of Exercise per Week: 7 days  . Minutes of Exercise per Session: 30 min  Stress: No Stress Concern Present  . Feeling of Stress : Not at all  Social Connections:   . Frequency of Communication with Friends and Family:   . Frequency of Social Gatherings with Friends and Family:   . Attends Religious Services:   . Active Member of Clubs or Organizations:   . Attends Archivist Meetings:   Marland Kitchen Marital Status:     Tobacco Counseling Counseling given: Not Answered Comment: quit smoking 2008   Clinical Intake:  Pre-visit preparation completed:  Yes  Pain : No/denies pain     Nutritional Risks: None Diabetes: No  How often do you need to have someone help you when you read instructions, pamphlets, or other written materials from your doctor or pharmacy?: 1 - Never What is the last grade level you completed in school?: some college  Diabetic: no Nutrition Risk Assessment:  Has the patient had any N/V/D within the last 2 months?  No  Does the patient have any non-healing wounds?  No  Has the patient had any unintentional weight loss or weight gain?  No   Diabetes:  Is the patient diabetic?  No  If diabetic, was a CBG obtained today?  N/A Did the patient bring in their glucometer from home?  N/A How often do you monitor your CBG's? N/A.   Financial Strains and Diabetes Management:  Are you having any financial strains with the device, your supplies or your medication? N/A.  Does the patient want to be seen by Chronic Care Management for management of their diabetes?  N/A Would the patient like to be referred to a Nutritionist or for Diabetic Management?  N/A     Interpreter Needed?: No  Information entered by :: CJohnson, LPN   Activities of Daily Living In your present state of health, do you have any difficulty performing the following activities: 02/15/2020  Hearing? N  Vision? N  Difficulty concentrating or making decisions? N  Walking or climbing stairs? N  Dressing or bathing? N  Doing errands, shopping? N  Preparing Food and eating ? N  Using the Toilet? N  In the past six months, have you accidently leaked urine? N  Do you have problems with loss of bowel control? N  Managing your Medications? N  Managing your Finances? N  Housekeeping or managing your Housekeeping? N  Some recent data might be hidden    Patient Care Team: Tonia Ghent, MD as PCP - General (Family Medicine) Lafayette Dragon, MD (Inactive) as Consulting Physician (Gastroenterology) Marty Heck, MD as Consulting Physician  (Gastroenterology)  Indicate any recent Medical Services you may have received from other than [facility] providers in the past year (date may be approximate).     Assessment:   This is a routine wellness examination for Avriel.  Hearing/Vision screen  Hearing Screening   125Hz  250Hz  500Hz  1000Hz  2000Hz  3000Hz  4000Hz  6000Hz  8000Hz   Right ear:           Left ear:           Vision Screening Comments: Patient gets annual eye exams   Dietary issues and exercise activities discussed: Current Exercise Habits: Home exercise routine, Type of exercise: treadmill, Time (Minutes): 30, Frequency (Times/Week): 7, Weekly Exercise (Minutes/Week): 210, Intensity: Moderate, Exercise limited by: None identified  Goals    . Patient Stated     02/15/2020, I will continue to ride my treadmill everyday for 30 minutes.       Depression Screen PHQ 2/9 Scores 02/15/2020 07/31/2017 12/11/2016 06/13/2016 06/06/2015  PHQ - 2 Score 0 0 0 0 0  PHQ- 9 Score 0 - - - -    Fall Risk Fall Risk  02/15/2020 07/31/2017 06/13/2016 06/06/2015  Falls in the past year? 0 Yes No No  Number falls in past yr: 0 1 - -  Injury with Fall? 0 No - -  Risk for fall due to : Medication side effect - - -  Follow up Falls evaluation completed;Falls prevention discussed - - -    Any stairs in or around the home? Yes  If so, are there any without handrails? No  Home free of loose throw rugs in walkways, pet beds, electrical cords, etc? Yes  Adequate lighting in your home to reduce risk of falls? Yes   ASSISTIVE DEVICES UTILIZED TO PREVENT FALLS:  Life alert? No  Use of a cane, walker or w/c? No  Grab bars in the bathroom? No  Shower chair or bench in shower? No  Elevated toilet seat or a handicapped toilet? No   TIMED UP AND GO:  Was the test performed? N/A, telephonic visit .   Cognitive Function: MMSE - Mini Mental State Exam 02/15/2020  Orientation to time 5  Orientation to Place 5  Registration  3  Attention/ Calculation 5    Recall 3  Language- repeat 1       Mini Cog  Mini-Cog screen was completed. Maximum score is 22. A value of 0 denotes this part of the MMSE was not completed or the patient failed this part of the Mini-Cog screening.  Immunizations Immunization History  Administered Date(s) Administered  . Hep A / Hep B 06/20/2011, 07/25/2011  . Hepatitis B 12/27/2011  . Influenza Split 06/10/2012  . Influenza Whole 06/07/2006, 04/21/2008  . Influenza,inj,Quad PF,6+ Mos 03/18/2013, 03/15/2014, 05/09/2015, 07/31/2017, 05/13/2018, 04/01/2019  . Moderna SARS-COVID-2 Vaccination 07/30/2019, 08/25/2019  . Pneumococcal Conjugate-13 06/06/2015  . Pneumococcal Polysaccharide-23 04/27/2013  . Td 06/11/2005  . Tdap 09/06/2014  . Zoster 04/27/2013    TDAP status: Up to date Flu Vaccine status: due, will be available in the office at the end of August Pneumococcal vaccine status: due, will discuss with provider first Covid-19 vaccine status: Completed vaccines  Qualifies for Shingles Vaccine? Yes   Zostavax completed Yes   Shingrix Completed?: No.    Education has been provided regarding the importance of this vaccine. Patient has been advised to call insurance company to determine out of pocket expense if they have not yet received this vaccine. Advised may also receive vaccine at local pharmacy or Health Dept. Verbalized acceptance and understanding.  Screening Tests Health Maintenance  Topic Date Due  . PNA vac Low Risk Adult (2 of 2 - PPSV23) 04/27/2018  . INFLUENZA VACCINE  02/06/2020  . DEXA SCAN  02/15/2024 (Originally 03/15/2014)  . COLONOSCOPY  05/03/2020  . MAMMOGRAM  03/23/2021  . TETANUS/TDAP  09/05/2024  . COVID-19 Vaccine  Completed  . Hepatitis C Screening  Completed    Health Maintenance  Health Maintenance Due  Topic Date Due  . PNA vac Low Risk Adult (2 of 2 - PPSV23) 04/27/2018  . INFLUENZA VACCINE  02/06/2020    Colorectal cancer screening: Completed 05/04/2015. Repeat  every 5 years Mammogram status: Completed 03/24/2019. Repeat every year Bone Density status: declined  Lung Cancer Screening: (Low Dose CT Chest recommended if Age 58-80 years, 30 pack-year currently smoking OR have quit w/in 15years.) does not qualify.    Additional Screening:  Hepatitis C Screening: does qualify; Completed 07/31/2017  Vision Screening: Recommended annual ophthalmology exams for early detection of glaucoma and other disorders of the eye. Is the patient up to date with their annual eye exam?  Yes  Who is the provider or what is the name of the office in which the patient attends annual eye exams? [facility] If pt is not established with a provider, would they like to be referred to a provider to establish care? No .   Dental Screening: Recommended annual dental exams for proper oral hygiene  Community Resource Referral / Chronic Care Management: CRR required this visit?  No   CCM required this visit?  No      Plan:     I have personally reviewed and noted the following in the patient's chart:   . Medical and social history . Use of alcohol, tobacco or illicit drugs  . Current medications and supplements . Functional ability and status . Nutritional status . Physical activity . Advanced directives . List of other physicians . Hospitalizations, surgeries, and ER visits in previous 12 months . Vitals . Screenings to include cognitive, depression, and falls . Referrals and appointments  In addition, I have reviewed and discussed with patient certain preventive protocols, quality metrics,  and best practice recommendations. A written personalized care plan for preventive services as well as general preventive health recommendations were provided to patient.   Due to this being a telephonic visit, the after visit summary with patients personalized plan was offered to patient via mail or my-chart.  Patient preferred to pick up at office at next  visit.   Andrez Grime, LPN   [id number]

## 2020-02-16 ENCOUNTER — Telehealth: Payer: Self-pay | Admitting: Family Medicine

## 2020-02-16 NOTE — Telephone Encounter (Signed)
Please check with patient about abdominal sx and recent ER eval.  Please schedule OV if needed.  Thanks.

## 2020-02-17 ENCOUNTER — Telehealth: Payer: Self-pay | Admitting: Cardiology

## 2020-02-17 NOTE — Telephone Encounter (Signed)
° ° °  Pt said last year Dr. Percival Spanish suggested to get her a heart monitor, she said her heart feels like she needs it right now and wondering if Dr. Percival Spanish can order it for her.

## 2020-02-17 NOTE — Telephone Encounter (Signed)
Spoke to pt who report on Tuesday she experienced an episode of feeling like she was going to pass out. Pt report BP was 162/101 HR 96. Pt state it scared her so bad that she went to the ER but was never evaluated due to 11 hour time.   Currently pt report feeling better and wonder if she possibly had a panic attack. Pt also wondering if Dr. Percival Spanish would like to order a heart montior as recommended last year to rule out anything cardiac related.  Will forward to MD

## 2020-02-17 NOTE — Telephone Encounter (Signed)
We probably should see her back first.  If this the only episode a monitor might not be very helpful. She does need to keep an eye on her BP.  Have her schedule follow up.

## 2020-02-17 NOTE — Telephone Encounter (Signed)
Left detailed message on voicemail to return call with update and/or OV.

## 2020-02-21 NOTE — Telephone Encounter (Signed)
Left message to call back  

## 2020-02-21 NOTE — Telephone Encounter (Signed)
Left detailed message on voicemail.  

## 2020-02-22 NOTE — Telephone Encounter (Signed)
Spoke with patient who says she is seeing her Cardiologist next week but that she does not think it was her heart, she thinks it was a panic attack.  Patient will call if she feels she needs a follow up appt with Dr. Damita Dunnings.

## 2020-02-23 NOTE — Telephone Encounter (Signed)
Noted. Thanks.

## 2020-02-23 NOTE — Telephone Encounter (Signed)
Pt has an appointment scheduled for 8/25 with Dr. Percival Spanish.

## 2020-02-29 NOTE — Progress Notes (Signed)
Cardiology Office Note   Date:  03/01/2020   ID:  Kanaya, Gunnarson 19-May-1951, MRN [id number]  PCP:  Tonia Ghent, MD  Cardiologist:   No primary care provider on file.   Chief Complaint  Patient presents with  . Presyncope      History of Present Illness: [name] is a 69 y.o. female who presents for evaluation of mild ventricular hypertrophy. She's also had palpitations. An echo in 2010 suggested moderate mitral regurgitation directed posteriorly. There was LVH. This was severe. She is also had some aortic insufficiency. However followup echocardiography in 2013 suggested only mild mitral regurgitation and no significant ventricular hypertrophy or aortic insufficiency.  The last echo in 2017 demonstrated mild LVH.    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  She said she felt like her peripheral vision was closing in.  She went to the emergency room on 810.  She had an EKG that was okay.  Blood sugar check was okay.  However, was an 11-hour wait so she did not stay.  She had an episode about 3 months prior to this where she had what sounds like floaters and she called EMS.  Of note when she was in the emergency room recently (I reviewed the records for this visit) her blood pressure was 162/101.  When she called EMT her blood pressure she said was 200/160.  Otherwise she thinks it is well controlled.  She wonders if she could have been having a panic attack both times.  She is not really feeling palpitations, presyncope or syncope.  She is not having any chest pressure, neck or arm discomfort.  She walks on a treadmill.  She exercises.  She has had some tingling in her left arm lips associated with some of the symptoms.  She has had some palpitations occasionally and she has had an Alive cor and I reviewed records and there has been no dysrhythmia.  Because of some sensation the symptoms similar earlier this year we did an  echo which was unremarkable and carotid Doppler which demonstrated no disease.  Past Medical History:  Diagnosis Date  . Diverticulosis   . GERD (gastroesophageal reflux disease)    occasional; OTC as needed  . 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  . Hypertrophic cardiomyopathy ([place])   . Palpitations   . Trigger finger of left hand 09/2012   left long finger    Past Surgical History:  Procedure Laterality Date  . BREAST REDUCTION SURGERY Bilateral 2001  . CHOLECYSTECTOMY  07/17/2011   Procedure: LAPAROSCOPIC CHOLECYSTECTOMY WITH INTRAOPERATIVE CHOLANGIOGRAM;  Surgeon: Haywood Lasso, MD;  Location: [facility];  Service: General;  Laterality: N/A;  . FOOT GANGLION EXCISION Right   . [place]  . LACERATION REPAIR Right 1996   thumb  . MYOMECTOMY  10/09/99   x 2  . OOPHORECTOMY Right 1980  . OOPHORECTOMY Left 2000  . OVARIAN CYST DRAINAGE  1980  . PARTIAL HYSTERECTOMY  2002   with no remaining ovary  . TRIGGER FINGER RELEASE Left 09/28/2012   Procedure: RELEASE TRIGGER FINGER/A-1 PULLEY;  Surgeon: Jolyn Nap, MD;  Location: [facility];  Service: Orthopedics;  Laterality: Left;     Current Outpatient Medications  Medication Sig Dispense Refill  . aspirin EC 81 MG tablet Take 1 tablet (81 mg total) by mouth daily.    Marland Kitchen  b complex vitamins tablet Take 1 tablet by mouth daily.    . verapamil (CALAN-SR) 240 MG CR tablet TAKE 1 TABLET EVERY DAY 90 tablet 3   No current facility-administered medications for this visit.    Allergies:   Morphine sulfate and Penicillins    ROS:  Please see the history of present illness.   Otherwise, review of systems are positive for none.   All other systems are reviewed and negative.    PHYSICAL EXAM: VS:  BP 130/70   Pulse 70   Ht 5\' 5"  (1.651 m)   Wt 183 lb 9.6 oz (83.3 kg)   SpO2 99%   BMI 30.55 kg/m  , BMI Body mass index is 30.55 kg/m. GENERAL:  Well appearing NECK:  No jugular venous  distention, waveform within normal limits, carotid upstroke brisk and symmetric, no bruits, no thyromegaly LUNGS:  Clear to auscultation bilaterally CHEST:  Unremarkable HEART:  PMI not displaced or sustained,S1 and S2 within normal limits, no S3, no S4, no clicks, no rubs, no murmurs ABD:  Flat, positive bowel sounds normal in frequency in pitch, no bruits, no rebound, no guarding, no midline pulsatile mass, no hepatomegaly, no splenomegaly EXT:  2 plus pulses throughout, no edema, no cyanosis no clubbing  EKG:  EKG is not ordered today. The ekg ordered 02/15/20 demonstrates sinus rhythm, rate 75, axis within normal limits, nonspecific ST-T wave changes, sinus arrhythmia.   Recent Labs: 09/27/2019: ALT 13; BUN 9; Creatinine, Ser 0.66; Hemoglobin 12.6; Platelets 195.0; Potassium 4.3; Sodium 138    Lipid Panel    Component Value Date/Time   CHOL 196 09/20/2019 1254   TRIG 114.0 09/20/2019 1254   HDL 48.20 09/20/2019 1254   CHOLHDL 4 09/20/2019 1254   VLDL 22.8 09/20/2019 1254   LDLCALC 125 (H) 09/20/2019 1254      Wt Readings from Last 3 Encounters:  03/01/20 183 lb 9.6 oz (83.3 kg)  02/15/20 183 lb (83 kg)  10/27/19 188 lb 3.2 oz (85.4 kg)      Other studies Reviewed: Additional studies/ records that were reviewed today include: ED records. Review of the above records demonstrates:  Please see elsewhere in the note.     ASSESSMENT AND PLAN:  DIZZINESS:    Etiology of this is not entirely clear.  I am going to have her wear a 2-week event monitor.  I will also check a TSH.  If this is unremarkable given the previous negative recent work-up I would not suggest further cardiovascular testing at this time.  LVH:    She had no significant LVH on the echo earlier this year.  No change in therapy.   HTN:   Blood pressures been well controlled.  She will continue the meds as listed.  PALPITATIONS:  This will be evaluated as above.  DYSLIPIDEMIA:     Her LDL has not been at  target but she has not wanted to use higher dose or more frequent statin.  AORTIC ATHEROSCLEROSIS:   She is participating better in some lifestyle changes for risk factor modification.  COVID EDUCATION: She has had her vaccine.  Current medicines are reviewed at length with the patient today.  The patient does not have concerns regarding medicines.  The following changes have been made:  no change  Labs/ tests ordered today include: None  Orders Placed This Encounter  Procedures  . TSH  . LONG TERM MONITOR (3-14 DAYS)     Disposition:   FU with me in  one year.     Signed, Minus Breeding, MD  03/01/2020 5:38 PM    [place]

## 2020-03-01 ENCOUNTER — Ambulatory Visit: Payer: Medicare HMO | Admitting: Cardiology

## 2020-03-01 ENCOUNTER — Telehealth: Payer: Self-pay | Admitting: Radiology

## 2020-03-01 ENCOUNTER — Other Ambulatory Visit: Payer: Self-pay

## 2020-03-01 ENCOUNTER — Encounter: Payer: Self-pay | Admitting: Cardiology

## 2020-03-01 VITALS — BP 130/70 | HR 70 | Ht 65.0 in | Wt 183.6 lb

## 2020-03-01 DIAGNOSIS — I517 Cardiomegaly: Secondary | ICD-10-CM

## 2020-03-01 DIAGNOSIS — I10 Essential (primary) hypertension: Secondary | ICD-10-CM | POA: Diagnosis not present

## 2020-03-01 DIAGNOSIS — I700 Atherosclerosis of aorta: Secondary | ICD-10-CM

## 2020-03-01 DIAGNOSIS — E785 Hyperlipidemia, unspecified: Secondary | ICD-10-CM | POA: Diagnosis not present

## 2020-03-01 DIAGNOSIS — R42 Dizziness and giddiness: Secondary | ICD-10-CM

## 2020-03-01 DIAGNOSIS — R002 Palpitations: Secondary | ICD-10-CM | POA: Diagnosis not present

## 2020-03-01 NOTE — Telephone Encounter (Signed)
Enrolled patient for a 14 day Zio XT  monitor to be mailed to patients home  °

## 2020-03-01 NOTE — Patient Instructions (Signed)
Medication Instructions:  Your Physician recommend you continue on your current medication as directed.    *If you need a refill on your cardiac medications before your next appointment, please call your pharmacy*   Lab Work: Your physician recommends that you return for lab work ( TSH)  If you have labs (blood work) drawn today and your tests are completely normal, you will receive your results only by: Marland Kitchen MyChart Message (if you have MyChart) OR . A paper copy in the mail If you have any lab test that is abnormal or we need to change your treatment, we will call you to review the results.   Testing/Procedures: Our physician has recommended that you wear an 14  DAY ZIO-PATCH monitor. The Zio patch cardiac monitor continuously records heart rhythm data for up to 14 days, this is for patients being evaluated for multiple types heart rhythms. For the first 24 hours post application, please avoid getting the Zio monitor wet in the shower or by excessive sweating during exercise. After that, feel free to carry on with regular activities. Keep soaps and lotions away from the ZIO XT Patch.   Someone from our office will call to verify address and mail monitor.     Follow-Up: At [facility], you and your health needs are our priority.  As part of our continuing mission to provide you with exceptional heart care, we have created designated Provider Care Teams.  These Care Teams include your primary Cardiologist (physician) and Advanced Practice Providers (APPs -  Physician Assistants and Nurse Practitioners) who all work together to provide you with the care you need, when you need it.  We recommend signing up for the patient portal called "MyChart".  Sign up information is provided on this After Visit Summary.  MyChart is used to connect with patients for Virtual Visits (Telemedicine).  Patients are able to view lab/test results, encounter notes, upcoming appointments, etc.  Non-urgent messages  can be sent to your provider as well.   To learn more about what you can do with MyChart, go to [email or web address].    Your next appointment:   1 year(s)  The format for your next appointment:   In Person  Provider:   Minus Breeding, MD  [place] Monitor Instructions   Your physician has requested you wear your ZIO patch monitor___14____days.   This is a single patch monitor.  [employer] supplies one patch monitor per enrollment.  Additional stickers are not available.   Please do not apply patch if you will be having a Nuclear Stress Test, Echocardiogram, Cardiac CT, MRI, or Chest Xray during the time frame you would be wearing the monitor. The patch cannot be worn during these tests.  You cannot remove and re-apply the ZIO XT patch monitor.   Your ZIO patch monitor will be sent USPS Priority mail from [facility] directly to your home address. The monitor may also be mailed to a PO BOX if home delivery is not available.   It may take 3-5 days to receive your monitor after you have been enrolled.   Once you have received you monitor, please review enclosed instructions.  Your monitor has already been registered assigning a specific monitor serial # to you.   Applying the monitor   Shave hair from upper left chest.   Hold abrader disc by orange tab.  Rub abrader in 40 strokes over left upper chest as indicated in your monitor instructions.   Clean area with 4  enclosed alcohol pads .  Use all pads to assure are is cleaned thoroughly.  Let dry.   Apply patch as indicated in monitor instructions.  Patch will be place under collarbone on left side of chest with arrow pointing upward.   Rub patch adhesive wings for 2 minutes.Remove white label marked "1".  Remove white label marked "2".  Rub patch adhesive wings for 2 additional minutes.   While looking in a mirror, press and release button in center of patch.  A small green light will flash 3-4 times .  This will  be your only indicator the monitor has been turned on.     Do not shower for the first 24 hours.  You may shower after the first 24 hours.   Press button if you feel a symptom. You will hear a small click.  Record Date, Time and Symptom in the Patient Log Book.   When you are ready to remove patch, follow instructions on last 2 pages of Patient Log Book.  Stick patch monitor onto last page of Patient Log Book.   Place Patient Log Book in [place] box.  Use locking tab on box and tape box closed securely.  The [employer] has [employer] on it.  Please place in mailbox as soon as possible.  Your physician should have your test results approximately 7 days after the monitor has been mailed back to [facility].   Call [place] at [phone] if you have questions regarding your ZIO XT patch monitor.  Call them immediately if you see an orange light blinking on your monitor.   If your monitor falls off in less than 4 days contact our Monitor department at [phone].  If your monitor becomes loose or falls off after 4 days call [employer] at [phone] for suggestions on securing your monitor.

## 2020-03-03 ENCOUNTER — Other Ambulatory Visit (INDEPENDENT_AMBULATORY_CARE_PROVIDER_SITE_OTHER): Payer: Medicare HMO

## 2020-03-03 DIAGNOSIS — R002 Palpitations: Secondary | ICD-10-CM | POA: Diagnosis not present

## 2020-03-21 DIAGNOSIS — R002 Palpitations: Secondary | ICD-10-CM | POA: Diagnosis not present

## 2020-04-06 ENCOUNTER — Ambulatory Visit: Payer: Medicare HMO | Admitting: Cardiology

## 2020-04-20 ENCOUNTER — Other Ambulatory Visit (HOSPITAL_COMMUNITY): Payer: Self-pay | Admitting: Family Medicine

## 2020-04-20 DIAGNOSIS — Z1231 Encounter for screening mammogram for malignant neoplasm of breast: Secondary | ICD-10-CM

## 2020-04-24 ENCOUNTER — Other Ambulatory Visit: Payer: Self-pay | Admitting: Family Medicine

## 2020-04-24 DIAGNOSIS — I10 Essential (primary) hypertension: Secondary | ICD-10-CM

## 2020-05-01 ENCOUNTER — Other Ambulatory Visit: Payer: Self-pay

## 2020-05-01 ENCOUNTER — Other Ambulatory Visit (INDEPENDENT_AMBULATORY_CARE_PROVIDER_SITE_OTHER): Payer: Medicare HMO

## 2020-05-01 ENCOUNTER — Encounter: Payer: Self-pay | Admitting: *Deleted

## 2020-05-01 DIAGNOSIS — R002 Palpitations: Secondary | ICD-10-CM | POA: Diagnosis not present

## 2020-05-01 DIAGNOSIS — I10 Essential (primary) hypertension: Secondary | ICD-10-CM

## 2020-05-01 LAB — COMPREHENSIVE METABOLIC PANEL
ALT: 13 U/L (ref 0–35)
AST: 17 U/L (ref 0–37)
Albumin: 4.3 g/dL (ref 3.5–5.2)
Alkaline Phosphatase: 67 U/L (ref 39–117)
BUN: 11 mg/dL (ref 6–23)
CO2: 28 mEq/L (ref 19–32)
Calcium: 9.7 mg/dL (ref 8.4–10.5)
Chloride: 103 mEq/L (ref 96–112)
Creatinine, Ser: 0.64 mg/dL (ref 0.40–1.20)
GFR: 89.09 mL/min (ref >60.00)
Glucose, Bld: 97 mg/dL (ref 70–99)
Potassium: 4.3 mEq/L (ref 3.5–5.1)
Sodium: 138 mEq/L (ref 135–145)
Total Bilirubin: 0.2 mg/dL (ref 0.2–1.2)
Total Protein: 7.1 g/dL (ref 6.0–8.3)

## 2020-05-01 LAB — LIPID PANEL
Cholesterol: 195 mg/dL (ref 0–200)
HDL: 47.7 mg/dL (ref >39.00)
LDL Cholesterol: 124 mg/dL — ABNORMAL HIGH (ref 0–99)
NonHDL: 147.77
Total CHOL/HDL Ratio: 4
Triglycerides: 119 mg/dL (ref 0.0–149.0)
VLDL: 23.8 mg/dL (ref 0.0–40.0)

## 2020-05-01 NOTE — Addendum Note (Signed)
Addended by: Cloyd Stagers on: 05/01/2020 04:41 PM   Modules accepted: Orders

## 2020-05-01 NOTE — Telephone Encounter (Signed)
-----   Message from Minus Breeding, MD sent at 05/01/2020  4:33 PM EDT ----- Regarding: FW: Pt requesting TSH order placed yes ----- Message ----- From: Cloyd Stagers, RT Sent: 05/01/2020   9:00 AM EDT To: Minus Breeding, MD Subject: Pt requesting TSH order placed                 I drew this pt today for our practice, she mentioned you had wanted a TSH as well.  There is no order in Epic and she forgot her paperwork, can you place an order to save her a trip and an additional lab visit?  Thank you! Dyke Maes

## 2020-05-01 NOTE — Telephone Encounter (Signed)
This encounter was created in error - please disregard.

## 2020-05-02 LAB — TSH: TSH: 1.53 u[IU]/mL (ref 0.35–4.50)

## 2020-05-05 ENCOUNTER — Encounter: Payer: Self-pay | Admitting: Family Medicine

## 2020-05-05 ENCOUNTER — Other Ambulatory Visit: Payer: Self-pay

## 2020-05-05 ENCOUNTER — Ambulatory Visit (HOSPITAL_COMMUNITY)
Admission: RE | Admit: 2020-05-05 | Discharge: 2020-05-05 | Disposition: A | Discharge status: Home / Self Care | Payer: Medicare HMO | Source: Ambulatory Visit | Attending: Family Medicine | Admitting: Family Medicine

## 2020-05-05 ENCOUNTER — Ambulatory Visit (INDEPENDENT_AMBULATORY_CARE_PROVIDER_SITE_OTHER): Payer: Medicare HMO | Admitting: Family Medicine

## 2020-05-05 VITALS — BP 142/74 | HR 78 | Temp 97.2°F | Ht 65.0 in | Wt 187.3 lb

## 2020-05-05 DIAGNOSIS — Z23 Encounter for immunization: Secondary | ICD-10-CM

## 2020-05-05 DIAGNOSIS — T148XXA Other injury of unspecified body region, initial encounter: Secondary | ICD-10-CM

## 2020-05-05 DIAGNOSIS — Z1231 Encounter for screening mammogram for malignant neoplasm of breast: Secondary | ICD-10-CM | POA: Diagnosis not present

## 2020-05-05 DIAGNOSIS — Z0000 Encounter for general adult medical examination without abnormal findings: Secondary | ICD-10-CM

## 2020-05-05 DIAGNOSIS — R198 Other specified symptoms and signs involving the digestive system and abdomen: Secondary | ICD-10-CM | POA: Diagnosis not present

## 2020-05-05 DIAGNOSIS — Z7189 Other specified counseling: Secondary | ICD-10-CM

## 2020-05-05 DIAGNOSIS — I10 Essential (primary) hypertension: Secondary | ICD-10-CM

## 2020-05-05 DIAGNOSIS — Z1211 Encounter for screening for malignant neoplasm of colon: Secondary | ICD-10-CM

## 2020-05-05 DIAGNOSIS — J3489 Other specified disorders of nose and nasal sinuses: Secondary | ICD-10-CM | POA: Diagnosis not present

## 2020-05-05 DIAGNOSIS — R2689 Other abnormalities of gait and mobility: Secondary | ICD-10-CM

## 2020-05-05 MED ORDER — LORATADINE 10 MG PO TABS
10.0000 mg | ORAL_TABLET | Freq: Every day | ORAL | Status: DC
Start: 2020-05-05 — End: 2020-08-22

## 2020-05-05 MED ORDER — FLUTICASONE PROPIONATE 50 MCG/ACT NA SUSP
2.0000 | Freq: Every day | NASAL | 12 refills | Status: DC
Start: 2020-05-05 — End: 2023-06-23

## 2020-05-05 NOTE — Patient Instructions (Addendum)
Add on flonase and let me know if that doesn't help.   Take care.  Glad to see you. Update me as needed.   We'll call about seeing GI.

## 2020-05-05 NOTE — Progress Notes (Signed)
This visit occurred during the SARS-CoV-2 public health emergency.  Safety protocols were in place, including screening questions prior to the visit, additional usage of staff PPE, and extensive cleaning of exam room while observing appropriate contact time as indicated for disinfecting solutions.  Sent went vegan for a few months and the stomach rumbling stopped.  She limited meat with reintroduction and sx haven't returned.  No blood in stool.  No black stools.  No vomiting.  Some occ R sided discomfort, RLQ.  Started a few weeks ago, then resolved.  No blood in urine.  We are going to refer her to GI anyway, given her colonoscopy due date.  See health maintenance discussion.  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  Some R sided back pain after lifting, after golf.  Likely recurrent intermittent muscle strain, d/w pt.  D/w pt about stretching.  Heat and ice helps.    Flu 2021 Shingles 2014 PNA 2016 Tetanus 09/2014, done out of clinic.  covid vaccine 2021 Colonoscopy 2016, d/w pt.  Referred.   Breast cancer screening to be done today.   DXA declined, d/w pt.  Advance directive- son Orvan July if patient were incapacitated.   She started taking a supplement and "brain fog" improved.  I asked her to let me know about the contents.  See follow-up messaging.  She is working on diet and exercise. D/w pt.    She had seen cardiology.  She had prev off balance sx.  No sensation of room spinning.  No syncope.  No presyncope.  She had eval re: sensation of palpitations.  We talked about statin use, she had aches on med.  No CP, SOB.    Cough with rhinorrhea, eye watering.  Taking claritin.   Meds, vitals, and allergies reviewed.   ROS: Per HPI unless specifically indicated in ROS section   GEN: nad, alert and oriented HEENT: ncat NECK: supple w/o LA CV: rrr.  PULM: ctab, no inc wob ABD: soft, +bs EXT: no edema SKIN: no acute rash but benign appearing  nevi on the back.   Back nontender to palpation.  No bruising.

## 2020-05-07 ENCOUNTER — Other Ambulatory Visit: Payer: Self-pay | Admitting: Family Medicine

## 2020-05-07 DIAGNOSIS — J3489 Other specified disorders of nose and nasal sinuses: Secondary | ICD-10-CM | POA: Insufficient documentation

## 2020-05-07 DIAGNOSIS — R198 Other specified symptoms and signs involving the digestive system and abdomen: Secondary | ICD-10-CM | POA: Insufficient documentation

## 2020-05-07 DIAGNOSIS — R140 Abdominal distension (gaseous): Secondary | ICD-10-CM | POA: Insufficient documentation

## 2020-05-07 MED ORDER — NONFORMULARY OR COMPOUNDED ITEM: Status: DC

## 2020-05-07 NOTE — Assessment & Plan Note (Signed)
She can add on Flonase and update me as needed.  Okay to continue Claritin.  Would avoid Claritin-D.  Discussed.

## 2020-05-07 NOTE — Assessment & Plan Note (Signed)
Flu 2021 Shingles 2014 PNA 2016 Tetanus 09/2014, done out of clinic.  covid vaccine 2021 Colonoscopy 2016, d/w pt.  Referred.   Breast cancer screening to be done today.   DXA declined, d/w pt.  Advance directive- son Orvan July if patient were incapacitated.   She started taking a supplement and "brain fog" improved.  I asked her to let me know about the contents.  See follow-up messaging.  She is working on diet and exercise. D/w pt.

## 2020-05-07 NOTE — Assessment & Plan Note (Signed)
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  Some R sided back pain after lifting, after golf.  Likely recurrent intermittent muscle strain, d/w pt.  D/w pt about stretching.  Heat and ice helps.  Benign exam today.  She will update me as needed.

## 2020-05-07 NOTE — Assessment & Plan Note (Signed)
She had seen cardiology.  She had prev off balance sx.  No sensation of room spinning.  No syncope.  No presyncope.  She had eval re: sensation of palpitations.  With negative work-up so far I would not intervene unless she had recurrent symptoms.  She can update me as needed.

## 2020-05-07 NOTE — Assessment & Plan Note (Signed)
Sent went vegan for a few months and the stomach rumbling stopped.  She limited meat with reintroduction and sx haven't returned.  No blood in stool.  No black stools.  No vomiting.  Some occ R sided discomfort, RLQ.  Started a few weeks ago, then resolved.  No blood in urine.  We are going to refer her to GI anyway, given her colonoscopy due date.  See health maintenance discussion.  No symptoms currently.  Benign abdominal exam.

## 2020-05-07 NOTE — Assessment & Plan Note (Signed)
Would continue verapamil. We talked about statin use, she had aches on med.  No CP, SOB.

## 2020-05-07 NOTE — Assessment & Plan Note (Signed)
Advance directive- son [name] designated if patient were incapacitated.   

## 2020-05-11 ENCOUNTER — Telehealth: Payer: Self-pay

## 2020-05-11 NOTE — Telephone Encounter (Signed)
Called and spoke with patient regarding her normal mammography as instructed by Damita Dunnings, MD. Patient stated that she had already seen the results on MyChart and had no further questions. Patient very appreciative of phone call.

## 2020-06-26 DIAGNOSIS — H524 Presbyopia: Secondary | ICD-10-CM | POA: Diagnosis not present

## 2020-07-11 ENCOUNTER — Other Ambulatory Visit: Payer: Self-pay

## 2020-07-11 ENCOUNTER — Encounter: Payer: Self-pay | Admitting: Family Medicine

## 2020-07-11 ENCOUNTER — Ambulatory Visit (INDEPENDENT_AMBULATORY_CARE_PROVIDER_SITE_OTHER): Payer: Medicare HMO | Admitting: Family Medicine

## 2020-07-11 DIAGNOSIS — R198 Other specified symptoms and signs involving the digestive system and abdomen: Secondary | ICD-10-CM
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
## 2020-07-11 NOTE — Patient Instructions (Signed)
Let me check back on your records.   Let me update GI.  We'll be in touch.  Take care.  Glad to see you.

## 2020-07-17 NOTE — Assessment & Plan Note (Signed)
I suspect she has a longstanding intermittent and nonominous functional disorder that could be along the lines of IBS-C.  I want to check back to her old records and then update GI.  She has benign exam today.  Okay for outpatient follow-up.  Differential diagnosis discussed with patient.  She agrees with plan.

## 2020-07-18 ENCOUNTER — Telehealth: Payer: Self-pay

## 2020-07-18 NOTE — Telephone Encounter (Signed)
Patricia Flock, MD  Yevette Edwards, RN [name] can you help schedule this patient a follow up office visit with me? Thanks     Lm on vm for patient to return call to schedule follow up with Dr. Havery Moros.

## 2020-07-19 ENCOUNTER — Telehealth: Payer: Self-pay | Admitting: Family Medicine

## 2020-07-19 MED ORDER — DICYCLOMINE HCL 10 MG PO CAPS: 10.0000 mg | ORAL_CAPSULE | Freq: Three times a day (TID) | ORAL | 1 refills | Status: DC | PRN

## 2020-07-19 NOTE — Telephone Encounter (Signed)
Please let patient know that I talked to Dr. Havery Moros.  The GI clinic should contact her about getting scheduled.  It is reasonable to try Bentyl as needed in the meantime.  I sent the prescription.  I think this is a reasonable option, to see if it provides some relief for her.  Dr. Havery Moros agreed with this.  Thanks.

## 2020-07-19 NOTE — Telephone Encounter (Signed)
Lm on vm for patient to return call 

## 2020-07-20 NOTE — Telephone Encounter (Signed)
Patient returned call, she has been scheduled for a follow up with Dr. Havery Moros on Friday, 08/04/2020 at 3:20 PM.

## 2020-07-20 NOTE — Telephone Encounter (Signed)
Spoke with pt and gave her Dr. Josefine Class recommendations and she stated that she has already gotten scheduled with GI.  [name]

## 2020-08-04 ENCOUNTER — Ambulatory Visit: Payer: Medicare HMO | Admitting: Gastroenterology

## 2020-08-10 ENCOUNTER — Other Ambulatory Visit: Payer: Self-pay

## 2020-08-10 ENCOUNTER — Ambulatory Visit (AMBULATORY_SURGERY_CENTER): Payer: Self-pay | Admitting: *Deleted

## 2020-08-10 ENCOUNTER — Telehealth: Payer: Self-pay | Admitting: Gastroenterology

## 2020-08-10 VITALS — Ht 65.0 in | Wt 188.0 lb

## 2020-08-10 DIAGNOSIS — Z800 Family history of malignant neoplasm of digestive organs: Secondary | ICD-10-CM

## 2020-08-10 MED ORDER — SUPREP BOWEL PREP KIT 17.5-3.13-1.6 GM/177ML PO SOLN: 1.0000 | Freq: Once | ORAL | 0 refills | Status: AC

## 2020-08-10 NOTE — Telephone Encounter (Signed)
Patient called requesting the prep medication be sent to [facility] on [address] in [place]

## 2020-08-10 NOTE — Telephone Encounter (Signed)
Suprep sent to [facility] at pt's request

## 2020-08-10 NOTE — Progress Notes (Signed)

## 2020-08-15 ENCOUNTER — Telehealth: Payer: Self-pay | Admitting: Acute Care

## 2020-08-15 NOTE — Telephone Encounter (Signed)
[name] I don't see where a new [facility] CT order was placed for this patient last year. I see your notes stating patient aware no longer qualifies for lung cancer screening due to quit smoking 15 years ago 07/07/2005

## 2020-08-16 NOTE — Telephone Encounter (Signed)
Called and spoke with pt and clarified that it has been 16 years now since she quit smoking so she no longer qualifies for lung screening. Pt verbalized understanding. Pt c/o and ongoing cough that she has had and will discuss getting a referral to see one our our pulmonary docs with her PCP. Nothing further needed at this time.

## 2020-08-22 ENCOUNTER — Other Ambulatory Visit: Payer: Self-pay

## 2020-08-22 ENCOUNTER — Encounter: Payer: Self-pay | Admitting: Family Medicine

## 2020-08-22 ENCOUNTER — Encounter: Payer: Self-pay | Admitting: Gastroenterology

## 2020-08-22 ENCOUNTER — Telehealth (INDEPENDENT_AMBULATORY_CARE_PROVIDER_SITE_OTHER): Payer: Medicare HMO | Admitting: Family Medicine

## 2020-08-22 VITALS — Ht 65.0 in | Wt 185.0 lb

## 2020-08-22 DIAGNOSIS — R059 Cough, unspecified: Secondary | ICD-10-CM

## 2020-08-22 MED ORDER — FAMOTIDINE 20 MG PO TABS
20.0000 mg | ORAL_TABLET | Freq: Every day | ORAL | Status: DC
Start: 2020-08-22 — End: 2020-10-10

## 2020-08-22 NOTE — Progress Notes (Signed)
Patient wants to discuss possible referral to pulmonology for cough she has had for 2-3 months.

## 2020-08-22 NOTE — Progress Notes (Signed)
Virtual visit completed through WebEx or similar program Patient location: home  Provider location: [place] at [facility], office  Participants: Patient and me (unless stated otherwise below)  Pandemic considerations d/w pt.   Limitations and rationale for visit method d/w patient.  Patient agreed to proceed.   CC: cough  HPI:  She has a sensation in the throat, then a cough.  Going on for about 2-3 months, intermittently, sporadic.  Not in the last few days but had a coughing fit a few days ago.  Tends to be early AM or later PM.  Now with some occ wheeze, more at night.  Sometimes cough clears the wheeze.  Not usually SOB.  She doesn't get SOB on the treadmill or going up steps inside.  She notes some sx with walking outside- she thought cold air could be a trigger.  H/o asthma in the distant past.    Some throat irritation with swallowing.  Not a ST.  No abnormal taste.  Normal swallowing function o/w.  She has used baking soda a few times a week for GERD sx, worse after eating tomatoes.  We talked about SABA cautions and she wanted to defer for now.    Other issue noted in the last month or so.  She has L sided scalp arm and leg chill sensation.  Started when taking milk thistle supplement and she is going to stop that and see if that helps.  occ noted R sided sx.  No weakness.  No facial droop.   Meds and allergies reviewed.   ROS: Per HPI unless specifically indicated in ROS section   NAD Speech wnl  A/P:  Cough.  She could have triggers with cold air and also GERD.  She can try pepcid in the meantime and use prn baking soda.  Avoid tomatoes, refer to pulmonary.  She agrees with plan.  Okay for outpatient follow-up.  She is also going to stop the liver supplement and see if that helps with the sensation where she gets chilled.  She does not have specific unilateral symptoms suggestive of stroke and we agreed to only make one change at a time here.

## 2020-08-23 NOTE — Assessment & Plan Note (Signed)
Cough.  She could have triggers with cold air and also GERD.  She can try pepcid in the meantime and use prn baking soda.  Avoid tomatoes, refer to pulmonary.  She agrees with plan.  Okay for outpatient follow-up.  She is also going to stop the liver supplement and see if that helps with the sensation where she gets chilled.  She does not have specific unilateral symptoms suggestive of stroke and we agreed to only make one change at a time here.

## 2020-08-24 ENCOUNTER — Other Ambulatory Visit: Payer: Self-pay

## 2020-08-24 ENCOUNTER — Encounter: Payer: Self-pay | Admitting: Gastroenterology

## 2020-08-24 ENCOUNTER — Ambulatory Visit (AMBULATORY_SURGERY_CENTER): Payer: Medicare HMO | Admitting: Gastroenterology

## 2020-08-24 VITALS — BP 151/90 | HR 61 | Temp 97.8°F | Resp 13 | Ht 65.0 in | Wt 188.0 lb

## 2020-08-24 DIAGNOSIS — D123 Benign neoplasm of transverse colon: Secondary | ICD-10-CM | POA: Diagnosis not present

## 2020-08-24 DIAGNOSIS — Z800 Family history of malignant neoplasm of digestive organs: Secondary | ICD-10-CM

## 2020-08-24 DIAGNOSIS — D122 Benign neoplasm of ascending colon: Secondary | ICD-10-CM | POA: Diagnosis not present

## 2020-08-24 DIAGNOSIS — Z1211 Encounter for screening for malignant neoplasm of colon: Secondary | ICD-10-CM | POA: Diagnosis not present

## 2020-08-24 DIAGNOSIS — K219 Gastro-esophageal reflux disease without esophagitis: Secondary | ICD-10-CM | POA: Diagnosis not present

## 2020-08-24 DIAGNOSIS — I429 Cardiomyopathy, unspecified: Secondary | ICD-10-CM | POA: Diagnosis not present

## 2020-08-24 DIAGNOSIS — Z86010 Personal history of colonic polyps: Secondary | ICD-10-CM | POA: Diagnosis not present

## 2020-08-24 MED ORDER — SODIUM CHLORIDE 0.9 % IV SOLN: 500.0000 mL | Freq: Once | INTRAVENOUS | Status: DC

## 2020-08-24 NOTE — Op Note (Signed)
[facility] Patient Name: [name] Procedure Date: 08/24/2020 11:49 AM MRN: [id number] Endoscopist: Remo Lipps P. Havery Moros , MD Age: 70 Referring MD:  Date of Birth: 1950/10/08 Gender: Female Account #: [id number] Procedure:                Colonoscopy Indications:              Screening in patient at increased risk: Family                            history of 1st-degree relative with colorectal                            cancer (father) Medicines:                Monitored Anesthesia Care Procedure:                Pre-Anesthesia Assessment:                           - Prior to the procedure, a History and Physical                            was performed, and patient medications and                            allergies were reviewed. The patient's tolerance of                            previous anesthesia was also reviewed. The risks                            and benefits of the procedure and the sedation                            options and risks were discussed with the patient.                            All questions were answered, and informed consent                            was obtained. Prior Anticoagulants: The patient has                            taken no previous anticoagulant or antiplatelet                            agents. ASA Grade Assessment: III - A patient with                            severe systemic disease. After reviewing the risks                            and benefits, the patient was deemed in  satisfactory condition to undergo the procedure.                           After obtaining informed consent, the colonoscope                            was passed under direct vision. Throughout the                            procedure, the patient's blood pressure, pulse, and                            oxygen saturations were monitored continuously. The                            Olympus PFC-H190DL [id number])  Colonoscope was                            introduced through the anus and advanced to the the                            cecum, identified by appendiceal orifice and                            ileocecal valve. The colonoscopy was performed                            without difficulty. The patient tolerated the                            procedure well. The quality of the bowel                            preparation was adequate. The ileocecal valve,                            appendiceal orifice, and rectum were photographed. Scope In: 12:00:30 PM Scope Out: 12:21:14 PM Scope Withdrawal Time: 0 hours 18 minutes 15 seconds  Total Procedure Duration: 0 hours 20 minutes 44 seconds  Findings:                 The perianal and digital rectal examinations were                            normal.                           A 3 mm polyp was found in the ascending colon. The                            polyp was sessile. The polyp was removed with a                            cold snare. Resection was complete, but the polyp  tissue was not retrieved.                           A 4 mm polyp was found in the transverse colon. The                            polyp was sessile. The polyp was removed with a                            cold snare. Resection and retrieval were complete.                           There was a medium-sized lipoma, in the transverse                            colon.                           Multiple medium-mouthed diverticula were found in                            the sigmoid colon, transverse colon and ascending                            colon.                           Internal hemorrhoids were found during retroflexion.                           The exam was otherwise without abnormality. Complications:            No immediate complications. Estimated blood loss:                            Minimal. Estimated Blood Loss:     Estimated blood loss  was minimal. Impression:               - One 3 mm polyp in the ascending colon, removed                            with a cold snare. Complete resection. Polyp tissue                            not retrieved.                           - One 4 mm polyp in the transverse colon, removed                            with a cold snare. Resected and retrieved.                           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                           -  Diverticulosis in the sigmoid colon, in the                            transverse colon and in the ascending colon.                           - Internal hemorrhoids.                           - The examination was otherwise normal. Recommendation:           - Patient has a contact number available for                            emergencies. The signs and symptoms of potential                            delayed complications were discussed with the                            patient. Return to normal activities tomorrow.                            Written discharge instructions were provided to the                            patient.                           - Resume previous diet.                           - Continue present medications.                           - Await pathology results.                           - Anticipate repeat colonoscopy in 5 years if                            patient wishes to continue surveillance at that time Carlota Raspberry. Armbruster, MD 08/24/2020 12:26:17 PM This report has been signed electronically.

## 2020-08-24 NOTE — Patient Instructions (Signed)
Handouts given for polyps, diverticulosis, hemorrhoids.  Await pathology results.  YOU HAD AN ENDOSCOPIC PROCEDURE TODAY AT [facility]:   Refer to the procedure report that was given to you for any specific questions about what was found during the examination.  If the procedure report does not answer your questions, please call your gastroenterologist to clarify.  If you requested that your care partner not be given the details of your procedure findings, then the procedure report has been included in a sealed envelope for you to review at your convenience later.  YOU SHOULD EXPECT: Some feelings of bloating in the abdomen. Passage of more gas than usual.  Walking can help get rid of the air that was put into your GI tract during the procedure and reduce the bloating. If you had a lower endoscopy (such as a colonoscopy or flexible sigmoidoscopy) you may notice spotting of blood in your stool or on the toilet paper. If you underwent a bowel prep for your procedure, you may not have a normal bowel movement for a few days.  Please Note:  You might notice some irritation and congestion in your nose or some drainage.  This is from the oxygen used during your procedure.  There is no need for concern and it should clear up in a day or so.  SYMPTOMS TO REPORT IMMEDIATELY:   Following lower endoscopy (colonoscopy or flexible sigmoidoscopy):  Excessive amounts of blood in the stool  Significant tenderness or worsening of abdominal pains  Swelling of the abdomen that is new, acute  Fever of 100F or higher  For urgent or emergent issues, a gastroenterologist can be reached at any hour by calling [phone]. Do not use MyChart messaging for urgent concerns.    DIET:  We do recommend a small meal at first, but then you may proceed to your regular diet.  Drink plenty of fluids but you should avoid alcoholic beverages for 24 hours.  ACTIVITY:  You should plan to take it easy for the  rest of today and you should NOT DRIVE or use heavy machinery until tomorrow (because of the sedation medicines used during the test).    FOLLOW UP: Our staff will call the number listed on your records 48-72 hours following your procedure to check on you and address any questions or concerns that you may have regarding the information given to you following your procedure. If we do not reach you, we will leave a message.  We will attempt to reach you two times.  During this call, we will ask if you have developed any symptoms of COVID 19. If you develop any symptoms (ie: fever, flu-like symptoms, shortness of breath, cough etc.) before then, please call [phone].  If you test positive for Covid 19 in the 2 weeks post procedure, please call and report this information to [place].    If any biopsies were taken you will be contacted by phone or by letter within the next 1-3 weeks.  Please call us at [phone] if you have not heard about the biopsies in 3 weeks.    SIGNATURES/CONFIDENTIALITY: You and/or your care partner have signed paperwork which will be entered into your electronic medical record.  These signatures attest to the fact that that the information above on your After Visit Summary has been reviewed and is understood.  Full responsibility of the confidentiality of this discharge information lies with you and/or your care-partner.

## 2020-08-24 NOTE — Progress Notes (Signed)
Report given to PACU, vss 

## 2020-08-24 NOTE — Progress Notes (Signed)
Called to room to assist during endoscopic procedure.  Patient ID and intended procedure confirmed with present staff. Received instructions for my participation in the procedure from the performing physician.  

## 2020-08-28 ENCOUNTER — Telehealth: Payer: Self-pay

## 2020-08-28 NOTE — Telephone Encounter (Signed)
Covid-19 screening questions   Do you now or have you had a fever in the last 14 days? No.  Do you have any respiratory symptoms of shortness of breath or cough now or in the last 14 days? No.  Do you have any family members or close contacts with diagnosed or suspected Covid-19 in the past 14 days? No.  Have you been tested for Covid-19 and found to be positive? No.       Follow up Call-  Call back number 08/24/2020  Post procedure Call Back phone  # [phone]  Permission to leave phone message Yes  Some recent data might be hidden     Patient questions:  Do you have a fever, pain , or abdominal swelling? No. Pain Score  0 *  Have you tolerated food without any problems? Yes.    Have you been able to return to your normal activities? Yes.    Do you have any questions about your discharge instructions: Diet   No. Medications  No. Follow up visit  No.  Do you have questions or concerns about your Care? No.  Actions: * If pain score is 4 or above: No action needed, pain <4.

## 2020-09-04 ENCOUNTER — Other Ambulatory Visit: Payer: Self-pay

## 2020-09-04 ENCOUNTER — Emergency Department (HOSPITAL_COMMUNITY)
Admission: EM | Admit: 2020-09-04 | Discharge: 2020-09-04 | Disposition: A | Discharge status: Home / Self Care | Payer: Medicare HMO | Attending: Emergency Medicine | Admitting: Emergency Medicine

## 2020-09-04 ENCOUNTER — Emergency Department (HOSPITAL_COMMUNITY): Payer: Medicare HMO

## 2020-09-04 DIAGNOSIS — R202 Paresthesia of skin: Secondary | ICD-10-CM | POA: Diagnosis not present

## 2020-09-04 DIAGNOSIS — Z87891 Personal history of nicotine dependence: Secondary | ICD-10-CM | POA: Insufficient documentation

## 2020-09-04 DIAGNOSIS — I10 Essential (primary) hypertension: Secondary | ICD-10-CM | POA: Insufficient documentation

## 2020-09-04 DIAGNOSIS — Z8673 Personal history of transient ischemic attack (TIA), and cerebral infarction without residual deficits: Secondary | ICD-10-CM | POA: Insufficient documentation

## 2020-09-04 DIAGNOSIS — R200 Anesthesia of skin: Secondary | ICD-10-CM

## 2020-09-04 DIAGNOSIS — Z79899 Other long term (current) drug therapy: Secondary | ICD-10-CM | POA: Diagnosis not present

## 2020-09-04 LAB — CBC
HCT: 38.2 % (ref 36.0–46.0)
Hemoglobin: 12.1 g/dL (ref 12.0–15.0)
MCH: 26.3 pg (ref 26.0–34.0)
MCHC: 31.7 g/dL (ref 30.0–36.0)
MCV: 83 fL (ref 80.0–100.0)
Platelets: 193 10*3/uL (ref 150–400)
RBC: 4.6 MIL/uL (ref 3.87–5.11)
RDW: 17.4 % — ABNORMAL HIGH (ref 11.5–15.5)
WBC: 4 10*3/uL (ref 4.0–10.5)
nRBC: 0 % (ref 0.0–0.2)

## 2020-09-04 LAB — BASIC METABOLIC PANEL
Anion gap: 12 (ref 5–15)
BUN: 12 mg/dL (ref 8–23)
CO2: 23 mmol/L (ref 22–32)
Calcium: 9.7 mg/dL (ref 8.9–10.3)
Chloride: 101 mmol/L (ref 98–111)
Creatinine, Ser: 0.55 mg/dL (ref 0.44–1.00)
GFR, Estimated: >60 mL/min (ref >60)
Glucose, Bld: 124 mg/dL — ABNORMAL HIGH (ref 70–99)
Potassium: 3.9 mmol/L (ref 3.5–5.1)
Sodium: 136 mmol/L (ref 135–145)

## 2020-09-04 NOTE — ED Notes (Signed)
Pt returned from CT °

## 2020-09-04 NOTE — ED Triage Notes (Signed)
Pt here pov from home with cc of left side tingling that started this am around 0800 when she woke up. Went to bed 830pm. Also said that her lower back hurts and it started around the same time. No weakness or numbness. NIHSS =0

## 2020-09-04 NOTE — ED Notes (Signed)
EDP assessed in triage; ok to start work-up from lobby

## 2020-09-04 NOTE — Discharge Instructions (Addendum)
As discussed, today's evaluation has been largely reassuring.  Your CT scan was unremarkable, your labs generally similar.  However, given your numbness/tingling, and the one lab abnormality of high blood glucose it is important that you follow-up with your physician.  Return here for concerning changes in your condition.

## 2020-09-04 NOTE — ED Provider Notes (Signed)
[facility] EMERGENCY DEPARTMENT Provider Note   CSN: [id number] Arrival date & time: 09/04/20  1956     History Chief Complaint  Patient presents with  . Tingling    Left Side     [name] is a 70 y.o. female.  HPI Patient presents with concern of left-sided tingling. The tingling is around her face, left arm, left leg.  It was present when she awoke, she went to bed last night and her normal state of health. She denies medical problems that are substantial, takes only 1 tablet of prescribed medication daily. No weakness, no discoordination, no confusion, no vision changes, no difficulty speaking, swallowing, breathing. I saw the patient in triage, and again in the examination room.  In that interval, patient notes that her facial numbness/tingling has resolved aside from left perioral tingling.  No interval changes.    Past Medical History:  Diagnosis Date  . Allergy   . Anemia    age 62   . Diverticulosis   . GERD (gastroesophageal reflux disease)    occasional; OTC as needed  . 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  . Hyperlipidemia   . Hypertrophic cardiomyopathy ([place])   . Palpitations   . Trigger finger of left hand 09/2012   left long finger    Patient Active Problem List   Diagnosis Date Noted  . Abdominal symptoms 05/07/2020  . Rhinorrhea 05/07/2020  . TIA (transient ischemic attack) 09/29/2019  . Seasonal allergies 04/14/2019  . Healthcare maintenance 11/04/2018  . Aortic atherosclerosis ([place]) 09/30/2017  . Advance care planning 06/07/2015  . Muscle strain 12/09/2014  . Achilles tendonitis 09/15/2013  . Balance problem 03/19/2013  . Cough 08/25/2012  . Rash 07/10/2012  . History of hepatitis C 05/29/2011  . Mitral and aortic regurgitation 01/24/2011  . Medicare annual wellness visit, subsequent 01/12/2011  . LVH (left ventricular hypertrophy) 05/03/2009  . DIVERTICULITIS OF COLON 03/02/2009  . External hemorrhoids 10/24/2008   . HYPERCHOLESTEROLEMIA 10/07/2006  . Essential hypertension 12/06/2005  . Hyperglycemia 12/06/2005    Past Surgical History:  Procedure Laterality Date  . BREAST REDUCTION SURGERY Bilateral 2001  . CHOLECYSTECTOMY  07/17/2011   Procedure: LAPAROSCOPIC CHOLECYSTECTOMY WITH INTRAOPERATIVE CHOLANGIOGRAM;  Surgeon: Haywood Lasso, MD;  Location: [facility];  Service: General;  Laterality: N/A;  . COLONOSCOPY    . FOOT GANGLION EXCISION Right   . [place]  . LACERATION REPAIR Right 1996   thumb  . MYOMECTOMY  10/09/99   x 2  . OOPHORECTOMY Right 1980  . OOPHORECTOMY Left 2000  . OVARIAN CYST DRAINAGE  1980  . PARTIAL HYSTERECTOMY  2002   with no remaining ovary  . POLYPECTOMY    . TRIGGER FINGER RELEASE Left 09/28/2012   Procedure: RELEASE TRIGGER FINGER/A-1 PULLEY;  Surgeon: Jolyn Nap, MD;  Location: [facility];  Service: Orthopedics;  Laterality: Left;  . UPPER GASTROINTESTINAL ENDOSCOPY       OB History    Gravida      Para      Term      Preterm      AB      Living  2     SAB      IAB      Ectopic      Multiple      Live Births              Family History  Problem Relation Age  of Onset  . Colon cancer Father 52  . Hepatitis C Brother   . Diabetes Sister   . Hypertension Brother   . Hepatitis C Brother   . Colon cancer Paternal Aunt        dx'd in her 65's   . Breast cancer Paternal Aunt   . Lung cancer Paternal Uncle   . Breast cancer Cousin   . Liver cancer Cousin   . Colon cancer Other        cousin   . Colon polyps Neg Hx   . Esophageal cancer Neg Hx   . Rectal cancer Neg Hx   . Stomach cancer Neg Hx     Social History   Tobacco Use  . Smoking status: Former Smoker    Packs/day: 2.50    Years: 42.00    Pack years: 105.00    Types: Cigarettes    Quit date: 07/07/2005    Years since quitting: 15.1  . Smokeless tobacco: Never Used  . Tobacco comment: quit smoking 2008  Vaping Use  . Vaping Use:  Never used  Substance Use Topics  . Alcohol use: Yes    Alcohol/week: 0.0 standard drinks    Comment: a few glasses a wine a week  . Drug use: No    Comment: previous hx. of    Home Medications Prior to Admission medications   Medication Sig Start Date End Date Taking? Authorizing Provider  b complex vitamins tablet Take 1 tablet by mouth daily.    [provider]  cholecalciferol (VITAMIN D3) 25 MCG (1000 UNIT) tablet Take 1,000 Units by mouth daily.    [provider]  famotidine (PEPCID) 20 MG tablet Take 1 tablet (20 mg total) by mouth daily. Patient not taking: Reported on 08/24/2020 08/22/20   Tonia Ghent, MD  fluticasone [facility]) 50 MCG/ACT nasal spray Place 2 sprays into both nostrils daily. 05/05/20   Tonia Ghent, MD  verapamil (CALAN-SR) 240 MG CR tablet TAKE 1 TABLET EVERY DAY 10/21/19   Tonia Ghent, MD    Allergies    Morphine sulfate, Penicillins, and Statins  Review of Systems   Review of Systems  Constitutional:       Per HPI, otherwise negative  HENT:       Per HPI, otherwise negative  Respiratory:       Per HPI, otherwise negative  Cardiovascular:       Per HPI, otherwise negative  Gastrointestinal: Negative for vomiting.  Endocrine:       Negative aside from HPI  Genitourinary:       Neg aside from HPI   Musculoskeletal:       Per HPI, otherwise negative  Skin: Negative.   Neurological: Negative for tremors, seizures, syncope, speech difficulty, weakness and headaches.    Physical Exam Updated Vital Signs BP (!) 144/99   Pulse 63   Temp 98 F (36.7 C)   Resp 15   Ht 5\' 5"  (1.651 m)   Wt 83.9 kg   SpO2 99%   BMI 30.79 kg/m   Physical Exam Vitals and nursing note reviewed.  Constitutional:      General: She is not in acute distress.    Appearance: She is well-developed and well-nourished.  HENT:     Head: Normocephalic and atraumatic.  Eyes:     General: No visual field deficit.    Extraocular Movements:  EOM normal.     Conjunctiva/sclera: Conjunctivae normal.  Cardiovascular:  Rate and Rhythm: Normal rate and regular rhythm.  Pulmonary:     Effort: Pulmonary effort is normal. No respiratory distress.     Breath sounds: Normal breath sounds. No stridor.  Abdominal:     General: There is no distension.  Musculoskeletal:        General: No edema.  Skin:    General: Skin is warm and dry.  Neurological:     Mental Status: She is alert and oriented to person, place, and time.     Cranial Nerves: No cranial nerve deficit, dysarthria or facial asymmetry.     Sensory: No sensory deficit.     Motor: No weakness, tremor, atrophy, abnormal muscle tone, seizure activity or pronator drift.     Coordination: Coordination normal.  Psychiatric:        Mood and Affect: Mood and affect normal.     ED Results / Procedures / Treatments   Labs (all labs ordered are listed, but only abnormal results are displayed) Labs Reviewed  CBC - Abnormal; Notable for the following components:      Result Value   RDW 17.4 (*)    All other components within normal limits  BASIC METABOLIC PANEL - Abnormal; Notable for the following components:   Glucose, Bld 124 (*)    All other components within normal limits    Radiology CT Head Wo Contrast  Result Date: 09/04/2020 CLINICAL DATA:  Left-sided tingling EXAM: CT HEAD WITHOUT CONTRAST TECHNIQUE: Contiguous axial images were obtained from the base of the skull through the vertex without intravenous contrast. COMPARISON:  CT brain 09/30/2019 FINDINGS: Brain: No acute territorial infarction, hemorrhage or intracranial mass. Mild atrophy. Stable ventricle size. Vascular: No hyperdense vessels. Mild carotid vascular calcification Skull: Normal. Negative for fracture or focal lesion. Sinuses/Orbits: Mild mucosal thickening in the sinuses Other: None IMPRESSION: 1. No CT evidence for acute intracranial abnormality. 2. Atrophy. Electronically Signed   By: Donavan Foil  M.D.   On: 09/04/2020 22:08    Procedures Procedures   Medications Ordered in ED Medications - No data to display  ED Course  I have reviewed the triage vital signs and the nursing notes.  Pertinent labs & imaging results that were available during my care of the patient were reviewed by me and considered in my medical decision making (see chart for details).  10:41 PM On repeat exam the patient is awake, alert, in no distress.  She is resting comfortably in her bed. Cardiac monitor 60s sinus unremarkable, pulse oximetry 99% room air normal  We discussed all findings, CT reassuring, with no focal neurologic deficiencies, reassuring CT scan, low suspicion for stroke. Labs unremarkable, aside from mild hyperglycemia, which is a new finding for her. This may be contributing to her mild dysesthesia. Given the absence of other focal abnormalities, complaints, low suspicion for other phenomena, the patient is amenable to, appropriate for following up with her physician this week.  Final Clinical Impression(s) / ED Diagnoses Final diagnoses:  Numbness   MDM Number of Diagnoses or Management Options Numbness: new, needed workup   Amount and/or Complexity of Data Reviewed Clinical lab tests: reviewed Tests in the radiology section of CPT: reviewed Tests in the medicine section of CPT: reviewed Decide to obtain previous medical records or to obtain history from someone other than the patient: yes Review and summarize past medical records: yes Independent visualization of images, tracings, or specimens: yes  Risk of Complications, Morbidity, and/or Mortality Presenting problems: high Diagnostic procedures: high Management  options: high  Critical Care Total time providing critical care: < 30 minutes  Patient Progress Patient progress: stable    Carmin Muskrat, MD 09/04/20 2243

## 2020-09-04 NOTE — ED Notes (Signed)
Went into room to provide pt with her discharge papers and she was observed walking out of room and dept. Pt had disconnected self from telemetry monitor and BP cuff. Gait was stable and pt appeared to be in NAD.

## 2020-09-11 ENCOUNTER — Other Ambulatory Visit: Payer: Self-pay | Admitting: Family Medicine

## 2020-09-28 ENCOUNTER — Telehealth: Payer: Self-pay

## 2020-09-28 NOTE — Telephone Encounter (Signed)
Received fax copy of access nurse note that had pt was bitten on foot and pain is leaving but swelling will not go down. Called cancelled by pt request. Sending faxed access nurse note for scanning. Also putting copy of access nurse note in Dr Josefine Class in box and giving copy to Sherrilee Gilles CMA because unable to reach pt by phone.

## 2020-09-29 NOTE — Telephone Encounter (Signed)
Please see if you can get update on patient today.  Thanks.

## 2020-09-29 NOTE — Telephone Encounter (Signed)
Spoke with patient and she is fine. She was not sure what bit her but the pain is now gone and the swelling is starting to go down as well. I advised her to call our office if it gets worse.

## 2020-10-02 ENCOUNTER — Telehealth: Payer: Medicare HMO | Admitting: Family Medicine

## 2020-10-10 ENCOUNTER — Other Ambulatory Visit: Payer: Self-pay

## 2020-10-10 ENCOUNTER — Telehealth (INDEPENDENT_AMBULATORY_CARE_PROVIDER_SITE_OTHER): Payer: Medicare HMO | Admitting: Family Medicine

## 2020-10-10 ENCOUNTER — Encounter: Payer: Self-pay | Admitting: Family Medicine

## 2020-10-10 DIAGNOSIS — R202 Paresthesia of skin: Secondary | ICD-10-CM

## 2020-10-10 DIAGNOSIS — R1310 Dysphagia, unspecified: Secondary | ICD-10-CM

## 2020-10-10 DIAGNOSIS — M722 Plantar fascial fibromatosis: Secondary | ICD-10-CM

## 2020-10-10 NOTE — Progress Notes (Signed)
Patient thinks she may have gotten bit by something about 3 weeks ago on right foot. Right foot some painful and swollen for almost 2 weeks. Patient states it all went away then came back for 2 days then went away again. Nothing present today on foot.

## 2020-10-10 NOTE — Progress Notes (Signed)
Virtual visit completed through WebEx or similar program Patient location: home  Provider location: [place] at [facility], office  Participants: Patient and me (unless stated otherwise below)  Pandemic considerations d/w pt.   Limitations and rationale for visit method d/w patient.  Patient agreed to proceed.   CC: foot sx  HPI:   She had prev L sided numbness that self resolved.  Had similar event later on, L sided again, no R sided sx, self resolved, but wasn't as noticeable as the initial event.  No clear trigger.  No weakness.  She'll update me as needed.  D/w pt.  She agrees.  We can refer to neuro if needed.    Prev cough d/w pt.  She notes burning with coughing with swallowing food, sx noted in the upper chest.  No sx with liquids.  She tried pepcid and baking soda in the meantime.  Minimal sputum production.  No blood in stool.  No black stools.     3 weeks ago had R foot/ankle swelling.  Iced at the time.  Foot pain at the time.  Had heel pain.  Swelling improved.  Then pain and swelling returned, iced and used heat.  Sx resolved in the meantime.  Now rare pain in the heel.    She has minimal swelling w/o redness or bruising at the medial malleolus.  No known bite.  Pain on plantar fascia, not at rest, only with pressure.  Pain with 1st step.    Meds and allergies reviewed.   ROS: Per HPI unless specifically indicated in ROS section   NAD Speech wnl Minimal swelling without redness or bruising at the right medial malleolus.  A/P: Paresthesia.  She'll update me as needed.  She agrees.  We can refer to neuro if needed.    Likely plantar fasciitis. Arch support, ice and stretch.  Will mail handout to patient.   Dysphagia.  I will update Dr. Havery Moros- may need repeat EGD.  D/w pt about trial of PPI in the meantime, she preferred to give it a try with baking soda.

## 2020-10-11 DIAGNOSIS — R1310 Dysphagia, unspecified: Secondary | ICD-10-CM | POA: Insufficient documentation

## 2020-10-11 DIAGNOSIS — M722 Plantar fascial fibromatosis: Secondary | ICD-10-CM | POA: Insufficient documentation

## 2020-10-11 NOTE — Assessment & Plan Note (Signed)
  Likely plantar fasciitis. Arch support, ice and stretch.  Will mail handout to patient.  She will update me as needed.

## 2020-10-11 NOTE — Assessment & Plan Note (Signed)
Paresthesia.  She'll update me as needed.  She agrees.  We can refer to neuro if needed.

## 2020-10-11 NOTE — Assessment & Plan Note (Signed)
  Dysphagia.  I will update Dr. Havery Moros- may need repeat EGD.  D/w pt about trial of PPI in the meantime, she preferred to give it a try with baking soda.

## 2020-11-13 DIAGNOSIS — S80869A Insect bite (nonvenomous), unspecified lower leg, initial encounter: Secondary | ICD-10-CM | POA: Diagnosis not present

## 2020-11-13 DIAGNOSIS — L03119 Cellulitis of unspecified part of limb: Secondary | ICD-10-CM | POA: Diagnosis not present

## 2020-12-08 ENCOUNTER — Ambulatory Visit: Payer: Medicare HMO | Admitting: Family Medicine

## 2020-12-28 ENCOUNTER — Other Ambulatory Visit: Payer: Self-pay

## 2020-12-28 ENCOUNTER — Encounter: Payer: Self-pay | Admitting: Family Medicine

## 2020-12-28 ENCOUNTER — Telehealth (INDEPENDENT_AMBULATORY_CARE_PROVIDER_SITE_OTHER): Payer: Medicare HMO | Admitting: Family Medicine

## 2020-12-28 VITALS — BP 156/99 | HR 73 | Temp 97.9°F | Ht 65.0 in | Wt 185.0 lb

## 2020-12-28 DIAGNOSIS — R202 Paresthesia of skin: Secondary | ICD-10-CM

## 2020-12-28 DIAGNOSIS — R238 Other skin changes: Secondary | ICD-10-CM | POA: Diagnosis not present

## 2020-12-28 DIAGNOSIS — I10 Essential (primary) hypertension: Secondary | ICD-10-CM | POA: Diagnosis not present

## 2020-12-28 NOTE — Progress Notes (Signed)
Virtual visit completed through WebEx or similar program Patient location: home  Provider location: [place] at [facility], office  Participants: Patient and me (unless stated otherwise below)  Pandemic considerations d/w pt.   Limitations and rationale for visit method d/w patient.  Patient agreed to proceed.   CC: skin lesions  HPI:  Small black lesions on the face inferior to L side of mouth, also on the R cheek and one on the right leg.  Noted in the last few months.  Palpable.  Grandmother had similar.  No ulceration.    Diffuse prickly sensation on the skin, itching.  No loss of sensation.    Separate hemisensory change with a warm chill on the R side of the scalp/arm/leg.  Can then happen on the L side.  Going on episodically for a few months, has happened mult times.  Is unilateral when it occurs.  No motor change.  She didn't know if it was stress related.    Meds and allergies reviewed.   ROS: Per HPI unless specifically indicated in ROS section   NAD Speech wnl Benign-appearing papules noted on the face.  A/P: Looks like benign papulosa.  Likely would be better not to remove, d/w pt about keloid risk.  Offered derm referral.  Could use typical hydrocortisone with cautions.    Unclear source with paresthesias.  No motor changes.  No concern for CVA.  D/w pt about CPE this fall.  We talked about getting labs for paresthesia and CPE at lab visit then OV thereafter.

## 2020-12-31 DIAGNOSIS — R238 Other skin changes: Secondary | ICD-10-CM | POA: Insufficient documentation

## 2020-12-31 NOTE — Assessment & Plan Note (Signed)
  Unclear source with paresthesias.  No motor changes.  No concern for CVA.  D/w pt about CPE this fall.  We talked about getting labs for paresthesia and CPE at lab visit then OV thereafter.

## 2020-12-31 NOTE — Assessment & Plan Note (Signed)
  Looks like benign papulosa.  Likely would be better not to remove, d/w pt about keloid risk.  Offered derm referral.  Could use typical hydrocortisone with cautions.

## 2021-01-01 ENCOUNTER — Telehealth: Payer: Self-pay

## 2021-01-01 NOTE — Telephone Encounter (Signed)
-----   Message from Tonia Ghent, MD sent at 12/31/2020 10:25 PM EDT ----- Please see about getting patient a lab appointment and then a physical set up for later in the fall.   It would be reasonable to go ahead and do the labs.  Thanks.  I already put in lab orders.

## 2021-01-01 NOTE — Telephone Encounter (Signed)
Please call and schedule lab and AWV appt for the fall. Labs can be anytime ahead of time.

## 2021-01-01 NOTE — Telephone Encounter (Signed)
Left voice message to cal the office

## 2021-01-09 ENCOUNTER — Other Ambulatory Visit: Payer: Self-pay

## 2021-01-09 ENCOUNTER — Encounter: Payer: Self-pay | Admitting: Registered Nurse

## 2021-01-09 ENCOUNTER — Telehealth (INDEPENDENT_AMBULATORY_CARE_PROVIDER_SITE_OTHER): Payer: Medicare HMO | Admitting: Registered Nurse

## 2021-01-09 DIAGNOSIS — R1032 Left lower quadrant pain: Secondary | ICD-10-CM | POA: Diagnosis not present

## 2021-01-09 MED ORDER — TRAMADOL HCL 50 MG PO TABS: 50.0000 mg | ORAL_TABLET | Freq: Three times a day (TID) | ORAL | 0 refills | Status: AC | PRN

## 2021-01-09 NOTE — Patient Instructions (Signed)
° ° ° °  If you have lab work done today you will be contacted with your lab results within the next 2 weeks.  If you have not heard from us then please contact us. The fastest way to get your results is to register for My Chart. ° ° °IF you received an x-ray today, you will receive an invoice from [facility]. Please contact [facility] at [phone] with questions or concerns regarding your invoice.  ° °IF you received labwork today, you will receive an invoice from [facility]. Please contact [facility] at [phone] with questions or concerns regarding your invoice.  ° °Our billing staff will not be able to assist you with questions regarding bills from these companies. ° °You will be contacted with the lab results as soon as they are available. The fastest way to get your results is to activate your My Chart account. Instructions are located on the last page of this paperwork. If you have not heard from us regarding the results in 2 weeks, please contact this office. °  ° ° ° °

## 2021-01-09 NOTE — Progress Notes (Signed)
Telemedicine Encounter- SOAP NOTE Established Patient  This telephone encounter was conducted with the patient's (or proxy's) verbal consent via audio telecommunications: yes/no: Yes Patient was instructed to have this encounter in a suitably private space; and to only have persons present to whom they give permission to participate. In addition, patient identity was confirmed by use of name plus two identifiers (DOB and address).  I discussed the limitations, risks, security and privacy concerns of performing an evaluation and management service by telephone and the availability of in person appointments. I also discussed with the patient that there may be a patient responsible charge related to this service. The patient expressed understanding and agreed to proceed.  I spent a total of 18 minutes talking with the patient or their proxy.  Patient at home Provider in office  Participants: Kathrin Ruddy, NP and [name]  Chief Complaint  Patient presents with   Diverticulitis    Patient states she had a flare up . She has been experiencing some left side pain , vomiting and some diarrhea since Sunday that's not getting better.    Subjective   [name] is a 70 y.o. established patient. Telephone visit today for diverticulitis  HPI Onset on Sunday L side lower abdominal pain, some diarrhea and vomiting Felt like it was getting better this morning, but has fallen back to same pain as initially Has ongoing bloating and excess flatulence Diet has been varied No blood in stool, some mucus in stool but limited No blood in vomit - only one occurrence of vomiting  Feels very much like her previous episodes of diverticulitis - last flare 3-5 years ago  Notes she was traveling recently, had a lot of different foods  Patient Active Problem List   Diagnosis Date Noted   Papule 12/31/2020   Plantar fasciitis 10/11/2020   Dysphagia 10/11/2020   Abdominal symptoms  05/07/2020   Rhinorrhea 05/07/2020   TIA (transient ischemic attack) 09/29/2019   Seasonal allergies 04/14/2019   Healthcare maintenance 11/04/2018   Aortic atherosclerosis ([place]) 09/30/2017   Advance care planning 06/07/2015   Muscle strain 12/09/2014   Paresthesia 03/16/2014   Achilles tendonitis 09/15/2013   Balance problem 03/19/2013   Cough 08/25/2012   Rash 07/10/2012   History of hepatitis C 05/29/2011   Mitral and aortic regurgitation 01/24/2011   Medicare annual wellness visit, subsequent 01/12/2011   LVH (left ventricular hypertrophy) 05/03/2009   DIVERTICULITIS OF COLON 03/02/2009   External hemorrhoids 10/24/2008   HYPERCHOLESTEROLEMIA 10/07/2006   Essential hypertension 12/06/2005   Hyperglycemia 12/06/2005    Past Medical History:  Diagnosis Date   Allergy    Anemia    age 56    Diverticulosis    GERD (gastroesophageal reflux disease)    occasional; OTC as needed   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   Hyperlipidemia    Hypertrophic cardiomyopathy (HCC)    Palpitations    Trigger finger of left hand 09/2012   left long finger    Current Outpatient Medications  Medication Sig Dispense Refill   b complex vitamins tablet Take 1 tablet by mouth daily.     cholecalciferol (VITAMIN D3) 25 MCG (1000 UNIT) tablet Take 1,000 Units by mouth daily.     fluticasone (FLONASE) 50 MCG/ACT nasal spray Place 2 sprays into both nostrils daily. 16 g 12   verapamil (CALAN-SR) 240 MG CR tablet TAKE 1 TABLET EVERY DAY 90 tablet 3   No  current facility-administered medications for this visit.    Allergies  Allergen Reactions   Morphine Sulfate Swelling   Penicillins Swelling   Statins     Joint aches.     Social History   Socioeconomic History   Marital status: Single    Spouse name: Not on file   Number of children: 2   Years of education: Not on file   Highest education level: Not on file  Occupational History   Occupation: [employer] from home   Tobacco Use   Smoking status: Former    Packs/day: 2.50    Years: 42.00    Pack years: 105.00    Types: Cigarettes    Quit date: 07/07/2005    Years since quitting: 15.5   Smokeless tobacco: Never   Tobacco comments:    quit smoking 2008  Vaping Use   Vaping Use: Never used  Substance and Sexual Activity   Alcohol use: Yes    Alcohol/week: 0.0 standard drinks    Comment: a few glasses a wine a week   Drug use: No    Comment: previous hx. of   Sexual activity: Yes    Birth control/protection: Surgical  Other Topics Concern   Not on file  Social History Narrative   Divorced 1982   2 children   Left handed   Working from home for [employer] as of 2019- laid off 2021   Social Determinants of Radio broadcast assistant Strain: Low Risk    Difficulty of Paying Living Expenses: Not hard at all  Food Insecurity: No Food Insecurity   Worried About Charity fundraiser in the Last Year: Never true   Arboriculturist in the Last Year: Never true  Transportation Needs: No Transportation Needs   Lack of Transportation (Medical): No   Lack of Transportation (Non-Medical): No  Physical Activity: Sufficiently Active   Days of Exercise per Week: 7 days   Minutes of Exercise per Session: 30 min  Stress: No Stress Concern Present   Feeling of Stress : Not at all  Social Connections: Not on file  Intimate Partner Violence: Not on file    Review of Systems  Constitutional: Negative.   HENT: Negative.    Eyes: Negative.   Respiratory: Negative.    Cardiovascular: Negative.   Gastrointestinal:  Positive for abdominal pain, diarrhea, nausea and vomiting. Negative for blood in stool, constipation, heartburn and melena.  Genitourinary: Negative.   Musculoskeletal: Negative.   Skin: Negative.   Neurological: Negative.   Endo/Heme/Allergies: Negative.   Psychiatric/Behavioral: Negative.    All other systems reviewed and are negative.  Objective   Vitals as reported by the  patient: There were no vitals filed for this visit.  [name] was seen today for diverticulitis.  Diagnoses and all orders for this visit:  Acute left lower quadrant pain -     traMADol (ULTRAM) 50 MG tablet; Take 1 tablet (50 mg total) by mouth every 8 (eight) hours as needed for up to 5 days.  PLAN Suspect acute diverticulitis. Will treat pain with tramadol 50mg  PO q8h PRN. Discussed nonpharm: liquid diet, slow reintroduction of BRAT diet, then resuming normal diet. Antibiotics no longer indicated for treatment of acute divericulitis. If pain persists or worsens, pt should proceed to ED to rule out perforation or other complications and etiologies. Pt voices understanding of above. Patient encouraged to call clinic with any questions, comments, or concerns.   I discussed the assessment and treatment plan  with the patient. The patient was provided an opportunity to ask questions and all were answered. The patient agreed with the plan and demonstrated an understanding of the instructions.   The patient was advised to call back or seek an in-person evaluation if the symptoms worsen or if the condition fails to improve as anticipated.  I provided 18 minutes of non-face-to-face time during this encounter.  Maximiano Coss, NP  Primary Care at [facility]

## 2021-01-10 ENCOUNTER — Telehealth: Payer: Self-pay | Admitting: *Deleted

## 2021-01-10 NOTE — Chronic Care Management (AMB) (Signed)
  Chronic Care Management   Note  01/10/2021 Name: [name] MRN: [id number] DOB: Mar 16, 1951  [name] is a 70 y.o. year old female who is a primary care patient of Tonia Ghent, MD. I reached out to [name] by phone today in response to a referral sent by [name]'s PCP Tonia Ghent, MD     [name] was given information about Chronic Care Management services today including:  CCM service includes personalized support from designated clinical staff supervised by her physician, including individualized plan of care and coordination with other care providers 24/7 contact phone numbers for assistance for urgent and routine care needs. Service will only be billed when office clinical staff spend 20 minutes or more in a month to coordinate care. Only one practitioner may furnish and bill the service in a calendar month. The patient may stop CCM services at any time (effective at the end of the month) by phone call to the office staff. The patient will be responsible for cost sharing (co-pay) of up to 20% of the service fee (after annual deductible is met).  Patient agreed to services and verbal consent obtained.   Follow up plan: Telephone appointment with care management team member scheduled for:01/26/2021  Julian Hy, [place], [place] Management  Direct Dial: [phone]

## 2021-01-10 NOTE — Chronic Care Management (AMB) (Signed)
  Chronic Care Management   Outreach Note  01/10/2021 Name: [name] MRN: [id number] DOB: 15-Jul-1950  [name] is a 70 y.o. year old female who is a primary care patient of Tonia Ghent, MD. I reached out to [name] by phone today in response to a referral sent by Ms. [place] PCP Tonia Ghent, MD     An unsuccessful telephone outreach was attempted today. The patient was referred to the case management team for assistance with care management and care coordination.   Follow Up Plan: A HIPAA compliant phone message was left for the patient providing contact information and requesting a return call.  If patient returns call to provider office, please advise to call Embedded Care Management Care Guide Soriya Worster at [place], [place] Management  Direct Dial: [phone]

## 2021-01-15 ENCOUNTER — Institutional Professional Consult (permissible substitution): Payer: Medicare HMO | Admitting: Internal Medicine

## 2021-01-18 ENCOUNTER — Other Ambulatory Visit: Payer: Self-pay

## 2021-01-18 ENCOUNTER — Ambulatory Visit: Payer: Medicare HMO | Admitting: Internal Medicine

## 2021-01-18 ENCOUNTER — Encounter: Payer: Self-pay | Admitting: Internal Medicine

## 2021-01-18 VITALS — BP 130/78 | HR 78 | Temp 98.0°F | Ht 65.0 in | Wt 186.4 lb

## 2021-01-18 DIAGNOSIS — R053 Chronic cough: Secondary | ICD-10-CM

## 2021-01-18 NOTE — Progress Notes (Signed)
[name]    [id number]    August 16, 1950  Primary Care Physician:Duncan, Elveria Rising, MD Date of Appointment: 01/18/2021 New Patient Evaluation, Self Referred  Chief complaint:   Chief Complaint  Patient presents with   Follow-up    Patient reports dry cough x 6 months,      HPI: [name] Is a 70 y.o. woman here with chronic cough for the last 6 months.She feels a tickle on the left side of her throat. Cough is dry, during the day, does not wake her up at night. She has chronic itchy/watery eyes. She is on flonase for this which helps a little bit. She also has a runny nose, but denies sinus congestion. She has nightly reflux which does wake her up at night.  She notes that her reflux is better if she doesn't eat after 7pm, and if she avoids certain foods like tomatoes.  Denies dyspnea, chest tightness, wheezing. She gets sob sometimes if she walks and talks too fast. No trouble with ADLs  She has childhood asthma which stopped at age 9 when she moved to [place].   Drinks wine one glass/night.  Drinks baking soda to help with the acid reflux, makes her burp.   She doesn't want to start taking prilosec because she doesn't want to be dependent on PPIs.   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 of   Sexual activity: Yes    Birth  control/protection: Surgical    Relevant family history:  Family History  Problem Relation Age of Onset   Colon cancer Father 64   Hepatitis C Brother    Diabetes Sister    Hypertension Brother    Hepatitis C Brother    Colon cancer Paternal Aunt        dx'd in her 84's    Breast cancer Paternal 21    Lung cancer Paternal Uncle    Breast cancer Cousin    Liver cancer Cousin    Colon cancer Other        cousin    Colon polyps Neg Hx    Esophageal cancer Neg Hx    Rectal cancer Neg Hx    Stomach cancer Neg Hx     Past Medical History:  Diagnosis Date   Allergy    Anemia    age 50    Diverticulosis    GERD (gastroesophageal reflux disease)    occasional; OTC as needed   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   Hyperlipidemia    Hypertrophic cardiomyopathy (HCC)    Palpitations    Trigger finger of left hand 09/2012   left  long finger    Past Surgical History:  Procedure Laterality Date   BREAST REDUCTION SURGERY Bilateral 2001   CHOLECYSTECTOMY  07/17/2011   Procedure: LAPAROSCOPIC CHOLECYSTECTOMY WITH INTRAOPERATIVE CHOLANGIOGRAM;  Surgeon: Haywood Lasso, MD;  Location: [place];  Service: General;  Laterality: N/A;   COLONOSCOPY     FOOT GANGLION EXCISION Right    HEMORRHOID SURGERY  1980   LACERATION REPAIR Right 1996   thumb   MYOMECTOMY  10/09/99   x 2   OOPHORECTOMY Right 1980   OOPHORECTOMY Left 2000   OVARIAN CYST DRAINAGE  1980   PARTIAL HYSTERECTOMY  2002   with no remaining ovary   POLYPECTOMY     TRIGGER FINGER RELEASE Left 09/28/2012   Procedure: RELEASE TRIGGER FINGER/A-1 PULLEY;  Surgeon: Jolyn Nap, MD;  Location: [place];  Service: Orthopedics;  Laterality: Left;   UPPER GASTROINTESTINAL ENDOSCOPY      Physical Exam: Blood pressure 130/78, pulse 78, temperature 98 F (36.7 C), temperature source Oral, height 5\' 5"  (1.651 m), weight 186 lb 6.4 oz (84.6 kg), SpO2 95 %. Gen:      No acute distress ENT:   no nasal polyps, mucus membranes moist Lungs:    No increased respiratory effort, symmetric chest wall excursion, clear to auscultation bilaterally, no wheezes or crackles CV:         Regular rate and rhythm; no murmurs, rubs, or gallops.  No pedal edema MSK: no acute synovitis of DIP or PIP joints, no mechanics hands.  Skin:      Warm and dry; no rashes Neuro: normal speech, no focal facial asymmetry Psych: alert and oriented x3, normal mood and affect  Data Reviewed/Medical Decision Making:  Independent interpretation of tests: Imaging:  Review of patient's ct scan Feb 2021 images revealed no acute cardiopulmonary process. The patient's images have been independently reviewed by me.    PFTs: none No flowsheet data found.  Labs:  Lab Results  Component Value Date   WBC 4.0 09/04/2020   HGB 12.1 09/04/2020   HCT 38.2 09/04/2020   MCV 83.0 09/04/2020   PLT 193 09/04/2020   Lab Results  Component Value Date   NA 136 09/04/2020   K 3.9 09/04/2020   CL 101 09/04/2020   CO2 23 09/04/2020     Immunization status:  Immunization History  Administered Date(s) Administered   Fluad Quad(high Dose 65+) 05/05/2020   Hep A / Hep B 06/20/2011, 07/25/2011   Hepatitis B 12/27/2011   Influenza Split 06/10/2012   Influenza Whole 06/07/2006, 04/21/2008   Influenza,inj,Quad PF,6+ Mos 03/18/2013, 03/15/2014, 05/09/2015, 07/31/2017, 05/13/2018, 04/01/2019   Moderna Sars-Covid-2 Vaccination 07/30/2019, 08/25/2019, 05/02/2020, 10/31/2020   Pneumococcal Conjugate-13 06/06/2015   Pneumococcal Polysaccharide-23 04/27/2013   Td 06/11/2005   Tdap 09/06/2014   Zoster, Live 04/27/2013     I reviewed prior external note(s) from primary care  I reviewed the result(s) of the labs and imaging as noted above.    Assessment:  Upper Airway Cough Syndrome GERD   Plan/Recommendations: Suspect multifactorial cough from allergic rhinitis and gerd.  She would like to try lifestyle modifications  for gerd first, and not try treatment. Discussed getting pfts and she will hold off until she changes her diet for reflux.  Continue daily claritin. Change flonase technique as we discussed and represcribe. Try OTC eye drops zatidor and pataday for itchy eyes.   We discussed disease management and progression at length today.   Return in about 6 months (  around 07/21/2021).   Lenice Llamas, MD Pulmonary and [place]

## 2021-01-18 NOTE — Patient Instructions (Addendum)
Please schedule follow up scheduled with myself in 6 months.  If my schedule is not open yet, we will contact you with a reminder closer to that time.  Keep taking clartin Try over the counter - Zatidor, Pataday eye drops to help with itchy eyes  Flonase - 1 spray on each side of your nose twice a day for first week, then 1 spray on each side.   Instructions for use: If you also use a saline nasal spray or rinse, use that first. Position the head with the chin slightly tucked. Use the right hand to spray into the left nostril and the right hand to spray into the left nostril.   Point the bottle away from the septum of your nose (cartilage that divides the two sides of your nose).  Hold the nostril closed on the opposite side from where you will spray Spray once and gently sniff to pull the medicine into the higher parts of your nose.  Don't sniff too hard as the medicine will drain down the back of your throat instead. Repeat with a second spray on the same side if prescribed. Repeat on the other side of your nose.  Try sleeping with a wedge pillow.    What is GERD? Gastroesophageal reflux disease (GERD) is gastroesophageal reflux diseasewhich occurs when the lower esophageal sphincter (LES) opens spontaneously, for varying periods of time, or does not close properly and stomach contents rise up into the esophagus. GER is also called acid reflux or acid regurgitation, because digestive juices--called acids--rise up with the food. The esophagus is the tube that carries food from the mouth to the stomach. The LES is a ring of muscle at the bottom of the esophagus that acts like a valve between the esophagus and stomach.  When acid reflux occurs, food or fluid can be tasted in the back of the mouth. When refluxed stomach acid touches the lining of the esophagus it may cause a burning sensation in the chest or throat called heartburn or acid indigestion. Occasional reflux is common. Persistent reflux  that occurs more than twice a week is considered GERD, and it can eventually lead to more serious health problems. People of all ages can have GERD. Studies have shown that GERD may worsen or contribute to asthma, chronic cough, and pulmonary fibrosis.   What are the symptoms of GERD? The main symptom of GERD in adults is frequent heartburn, also called acid indigestion--burning-type pain in the lower part of the mid-chest, behind the breast bone, and in the mid-abdomen.  Not all reflux is acidic in nature, and many patients don't have heart burn at all. Sometimes it feels like a cough (either dry or with mucus), choking sensation, asthma, shortness of breath, waking up at night, frequent throat clearing, or trouble swallowing.    What causes GERD? The reason some people develop GERD is still unclear. However, research shows that in people with GERD, the LES relaxes while the rest of the esophagus is working. Anatomical abnormalities such as a hiatal hernia may also contribute to GERD. A hiatal hernia occurs when the upper part of the stomach and the LES move above the diaphragm, the muscle wall that separates the stomach from the chest. Normally, the diaphragm helps the LES keep acid from rising up into the esophagus. When a hiatal hernia is present, acid reflux can occur more easily. A hiatal hernia can occur in people of any age and is most often a normal finding in otherwise healthy  people over age 41. Most of the time, a hiatal hernia produces no symptoms.   Other factors that may contribute to GERD include - Obesity or recent weight gain - Pregnancy  - Smoking  - Diet - Certain medications  Common foods that can worsen reflux symptoms include: - carbonated beverages - artificial sweeteners - citrus fruits  - chocolate  - drinks with caffeine or alcohol  - fatty and fried foods  - garlic and onions  - mint flavorings  - spicy foods  - tomato-based foods, like spaghetti sauce, salsa,  chili, and pizza   Lifestyle Changes If you smoke, stop.  Avoid foods and beverages that worsen symptoms (see above.) Lose weight if needed.  Eat small, frequent meals.  Wear loose-fitting clothes.  Avoid lying down for 3 hours after a meal.  Raise the head of your bed 6 to 8 inches by securing wood blocks under the bedposts. Just using extra pillows will not help, but using a wedge-shaped pillow may be helpful.  Medications  H2 blockers, such as cimetidine (Tagamet HB), famotidine (Pepcid AC), nizatidine (Axid AR), and ranitidine (Zantac 75), decrease acid production. They are available in prescription strength and over-the-counter strength. These drugs provide short-term relief and are effective for about half of those who have GERD symptoms.  Proton pump inhibitors include omeprazole (Prilosec, Zegerid), lansoprazole (Prevacid), pantoprazole (Protonix), rabeprazole (Aciphex), and esomeprazole (Nexium), which are available by prescription. Prilosec is also available in over-the-counter strength. Proton pump inhibitors are more effective than H2 blockers and can relieve symptoms and heal the esophageal lining in almost everyone who has GERD.  Because drugs work in different ways, combinations of medications may help control symptoms. People who get heartburn after eating may take both antacids and H2 blockers. The antacids work first to neutralize the acid in the stomach, and then the H2 blockers act on acid production. By the time the antacid stops working, the H2 blocker will have stopped acid production. Your health care provider is the best source of information about how to use medications for GERD.   Points to Remember 1. You can have GERD without having heartburn. Your symptoms could include a dry cough, asthma symptoms, or trouble swallowing.  2. Taking medications daily as prescribed is important in controlling you symptoms.  Sometimes it can take up to 8 weeks to fully achieve the  effects of the medications prescribed.  3. Coughing related to GERD can be difficult to treat and is very frustrating!  However, it is important to stick with these medications and lifestyle modifications before pursuing more aggressive or invasive test and treatments.

## 2021-01-26 ENCOUNTER — Ambulatory Visit: Payer: Medicare HMO

## 2021-01-26 DIAGNOSIS — I10 Essential (primary) hypertension: Secondary | ICD-10-CM

## 2021-01-26 DIAGNOSIS — K5732 Diverticulitis of large intestine without perforation or abscess without bleeding: Secondary | ICD-10-CM

## 2021-01-26 NOTE — Chronic Care Management (AMB) (Signed)
  Care Management   Follow Up Note   01/26/2021 Name: [name] MRN: [id number] DOB: October 08, 1950   Referred by: Tonia Ghent, MD Reason for referral : Chronic Care Management (Initial assessment)   Successful contact was made with the patient to discuss care management and care coordination services. Patient declines engagement at this time.   Follow Up Plan:  Patient was advised to contact her primary care provider office if she wishes to engage in CCM program at a later time.  Patient verbalized understanding.   Quinn Plowman RN,BSN,CCM RN Case Manager [employer]  [phone]

## 2021-02-04 ENCOUNTER — Emergency Department (HOSPITAL_COMMUNITY): Payer: Medicare HMO

## 2021-02-04 ENCOUNTER — Encounter (HOSPITAL_COMMUNITY): Payer: Self-pay | Admitting: *Deleted

## 2021-02-04 ENCOUNTER — Other Ambulatory Visit: Payer: Self-pay

## 2021-02-04 ENCOUNTER — Telehealth: Payer: Self-pay | Admitting: Family Medicine

## 2021-02-04 ENCOUNTER — Emergency Department (HOSPITAL_COMMUNITY)
Admission: EM | Admit: 2021-02-04 | Discharge: 2021-02-04 | Disposition: A | Discharge status: Home / Self Care | Payer: Medicare HMO | Attending: Emergency Medicine | Admitting: Emergency Medicine

## 2021-02-04 DIAGNOSIS — I10 Essential (primary) hypertension: Secondary | ICD-10-CM | POA: Diagnosis not present

## 2021-02-04 DIAGNOSIS — Z79899 Other long term (current) drug therapy: Secondary | ICD-10-CM | POA: Diagnosis not present

## 2021-02-04 DIAGNOSIS — Z20822 Contact with and (suspected) exposure to covid-19: Secondary | ICD-10-CM | POA: Diagnosis not present

## 2021-02-04 DIAGNOSIS — R519 Headache, unspecified: Secondary | ICD-10-CM | POA: Insufficient documentation

## 2021-02-04 DIAGNOSIS — Z87891 Personal history of nicotine dependence: Secondary | ICD-10-CM | POA: Diagnosis not present

## 2021-02-04 DIAGNOSIS — H9202 Otalgia, left ear: Secondary | ICD-10-CM | POA: Insufficient documentation

## 2021-02-04 LAB — BASIC METABOLIC PANEL
Anion gap: 4 — ABNORMAL LOW (ref 5–15)
BUN: 6 mg/dL — ABNORMAL LOW (ref 8–23)
CO2: 30 mmol/L (ref 22–32)
Calcium: 9.5 mg/dL (ref 8.9–10.3)
Chloride: 104 mmol/L (ref 98–111)
Creatinine, Ser: 0.62 mg/dL (ref 0.44–1.00)
GFR, Estimated: >60 mL/min (ref >60)
Glucose, Bld: 97 mg/dL (ref 70–99)
Potassium: 4.3 mmol/L (ref 3.5–5.1)
Sodium: 138 mmol/L (ref 135–145)

## 2021-02-04 LAB — CBC WITH DIFFERENTIAL/PLATELET
Abs Immature Granulocytes: 0.01 10*3/uL (ref 0.00–0.07)
Basophils Absolute: 0 10*3/uL (ref 0.0–0.1)
Basophils Relative: 1 %
Eosinophils Absolute: 0.2 10*3/uL (ref 0.0–0.5)
Eosinophils Relative: 5 %
HCT: 39.7 % (ref 36.0–46.0)
Hemoglobin: 12.4 g/dL (ref 12.0–15.0)
Immature Granulocytes: 0 %
Lymphocytes Relative: 35 %
Lymphs Abs: 1.2 10*3/uL (ref 0.7–4.0)
MCH: 27.4 pg (ref 26.0–34.0)
MCHC: 31.2 g/dL (ref 30.0–36.0)
MCV: 87.6 fL (ref 80.0–100.0)
Monocytes Absolute: 0.3 10*3/uL (ref 0.1–1.0)
Monocytes Relative: 9 %
Neutro Abs: 1.7 10*3/uL (ref 1.7–7.7)
Neutrophils Relative %: 50 %
Platelets: 192 10*3/uL (ref 150–400)
RBC: 4.53 MIL/uL (ref 3.87–5.11)
RDW: 13.8 % (ref 11.5–15.5)
WBC: 3.4 10*3/uL — ABNORMAL LOW (ref 4.0–10.5)
nRBC: 0 % (ref 0.0–0.2)

## 2021-02-04 LAB — RESP PANEL BY RT-PCR (FLU A&B, COVID) ARPGX2
Influenza A by PCR: NEGATIVE
Influenza B by PCR: NEGATIVE
SARS Coronavirus 2 by RT PCR: NEGATIVE

## 2021-02-04 LAB — C-REACTIVE PROTEIN: CRP: <0.5 mg/dL (ref <1.0)

## 2021-02-04 LAB — SEDIMENTATION RATE: Sed Rate: 10 mm/hr (ref 0–22)

## 2021-02-04 MED ORDER — ACETAMINOPHEN 325 MG PO TABS
650.0000 mg | ORAL_TABLET | Freq: Once | ORAL | Status: AC
  Administered 2021-02-04: 650 mg via ORAL
  Filled 2021-02-04: qty 2

## 2021-02-04 MED ORDER — SODIUM CHLORIDE 0.9 % IV BOLUS
500.0000 mL | Freq: Once | INTRAVENOUS | Status: AC
  Administered 2021-02-04: 500 mL via INTRAVENOUS

## 2021-02-04 MED ORDER — IOHEXOL 350 MG/ML SOLN
75.0000 mL | Freq: Once | INTRAVENOUS | Status: AC | PRN
  Administered 2021-02-04: 75 mL via INTRAVENOUS

## 2021-02-04 NOTE — ED Provider Notes (Signed)
[facility] EMERGENCY DEPARTMENT Provider Note   CSN: [id number] Arrival date & time: 02/04/21  9476     History Chief Complaint  Patient presents with   Ear Pain    [name] is a 70 y.o. female history of allergy, anemia, hyperlipidemia, cardiomyopathy, TIA, hypertension.  Patient presents today for left-sided headache onset 2 days ago, no clear inciting event.  She reports sharp pain just behind her left ear, intermittent, nonradiating, moderate to severe intensity.  Pain will sometimes occur several times a minute but then will go away for several hours before returning.  Patient reports her pain is occasionally worse with loud noises.  Denies fever/chills, fall/injury, vision changes, neck stiffness, chest pain/shortness of breath, abdominal pain, nausea/vomiting, diarrhea, numbness/tingling, weakness or any additional concerns.  HPI     Past Medical History:  Diagnosis Date   Allergy    Anemia    age 66    Diverticulosis    GERD (gastroesophageal reflux disease)    occasional; OTC as needed   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   Hyperlipidemia    Hypertrophic cardiomyopathy (HCC)    Palpitations    Trigger finger of left hand 09/2012   left long finger    Patient Active Problem List   Diagnosis Date Noted   Papule 12/31/2020   Plantar fasciitis 10/11/2020   Dysphagia 10/11/2020   Abdominal symptoms 05/07/2020   Rhinorrhea 05/07/2020   TIA (transient ischemic attack) 09/29/2019   Seasonal allergies 04/14/2019   Healthcare maintenance 11/04/2018   Aortic atherosclerosis ([place]) 09/30/2017   Advance care planning 06/07/2015   Muscle strain 12/09/2014   Paresthesia 03/16/2014   Achilles tendonitis 09/15/2013   Balance problem 03/19/2013   Cough 08/25/2012   Rash 07/10/2012   History of hepatitis C 05/29/2011   Mitral and aortic regurgitation 01/24/2011   Medicare annual wellness visit, subsequent 01/12/2011   LVH (left ventricular  hypertrophy) 05/03/2009   DIVERTICULITIS OF COLON 03/02/2009   External hemorrhoids 10/24/2008   HYPERCHOLESTEROLEMIA 10/07/2006   Essential hypertension 12/06/2005   Hyperglycemia 12/06/2005    Past Surgical History:  Procedure Laterality Date   BREAST REDUCTION SURGERY Bilateral 2001   CHOLECYSTECTOMY  07/17/2011   Procedure: LAPAROSCOPIC CHOLECYSTECTOMY WITH INTRAOPERATIVE CHOLANGIOGRAM;  Surgeon: Haywood Lasso, MD;  Location: [facility];  Service: General;  Laterality: N/A;   COLONOSCOPY     FOOT GANGLION EXCISION Right    Otho Right 1996   thumb   MYOMECTOMY  10/09/99   x 2   OOPHORECTOMY Right 1980   OOPHORECTOMY Left [place]   PARTIAL HYSTERECTOMY  2002   with no remaining ovary   POLYPECTOMY     TRIGGER FINGER RELEASE Left 09/28/2012   Procedure: RELEASE TRIGGER FINGER/A-1 PULLEY;  Surgeon: Jolyn Nap, MD;  Location: [place];  Service: Orthopedics;  Laterality: Left;   UPPER GASTROINTESTINAL ENDOSCOPY       OB History     Gravida      Para      Term      Preterm      AB      Living  2      SAB      IAB      Ectopic      Multiple      Live Births  Family History  Problem Relation Age of Onset   Colon cancer Father 39   Hepatitis C Brother    Diabetes Sister    Hypertension Brother    Hepatitis C Brother    Colon cancer Paternal Aunt        dx'd in her 69's    Breast cancer Paternal 30    Lung cancer Paternal Uncle    Breast cancer Cousin    Liver cancer Cousin    Colon cancer Other        cousin    Colon polyps Neg Hx    Esophageal cancer Neg Hx    Rectal cancer Neg Hx    Stomach cancer Neg Hx     Social History   Tobacco Use   Smoking status: Former    Packs/day: 2.50    Years: 42.00    Pack years: 105.00    Types: Cigarettes    Quit date: 07/07/2005    Years since quitting: 15.5   Smokeless tobacco: Never   Tobacco  comments:    quit smoking 2008  Vaping Use   Vaping Use: Never used  Substance Use Topics   Alcohol use: Not Currently    Comment: a few glasses a wine a week   Drug use: No    Comment: previous hx. of    Home Medications Prior to Admission medications   Medication Sig Start Date End Date Taking? Authorizing Provider  b complex vitamins tablet Take 1 tablet by mouth daily.    [provider]  Black Currant Seed Oil 500 MG CAPS Take 1 capsule by mouth daily.    [provider]  cholecalciferol (VITAMIN D3) 25 MCG (1000 UNIT) tablet Take 1,000 Units by mouth daily.    [provider]  fluticasone (FLONASE) 50 MCG/ACT nasal spray Place 2 sprays into both nostrils daily. 05/05/20   Tonia Ghent, MD  verapamil (CALAN-SR) 240 MG CR tablet TAKE 1 TABLET EVERY DAY 09/12/20   Tonia Ghent, MD    Allergies    Morphine sulfate, Penicillins, and Statins  Review of Systems   Review of Systems Ten systems are reviewed and are negative for acute change except as noted in the HPI  Physical Exam Updated Vital Signs BP (!) 156/91 (BP Location: Left Arm)   Pulse 63   Temp 98.2 F (36.8 C) (Oral)   Resp 18   Ht '5\' 5"'  (1.651 m)   Wt 83.9 kg   SpO2 100%   BMI 30.79 kg/m   Physical Exam Constitutional:      General: She is not in acute distress.    Appearance: Normal appearance. She is well-developed. She is not ill-appearing or diaphoretic.  HENT:     Head: Normocephalic and atraumatic.     Jaw: There is normal jaw occlusion. No trismus.     Comments: No TTP of the temporal or cervical arteries.    Right Ear: Tympanic membrane and external ear normal.     Left Ear: Tympanic membrane and external ear normal.     Nose: Nose normal.     Mouth/Throat:     Mouth: Mucous membranes are moist.     Pharynx: Oropharynx is clear. No oropharyngeal exudate or posterior oropharyngeal erythema.  Eyes:     General: Vision grossly intact. Gaze aligned appropriately.      Pupils: Pupils are equal, round, and reactive to light.  Neck:     Trachea: Trachea and phonation normal.  Meningeal: Brudzinski's sign absent.  Pulmonary:     Effort: Pulmonary effort is normal. No respiratory distress.  Abdominal:     General: There is no distension.     Palpations: Abdomen is soft.     Tenderness: There is no abdominal tenderness. There is no guarding or rebound.  Musculoskeletal:        General: Normal range of motion.     Cervical back: Normal range of motion and neck supple.  Skin:    General: Skin is warm and dry.  Neurological:     Mental Status: She is alert.     GCS: GCS eye subscore is 4. GCS verbal subscore is 5. GCS motor subscore is 6.     Comments: Speech is clear and goal oriented, follows commands Major Cranial nerves without deficit, no facial droop Normal strength in upper and lower extremities bilaterally including dorsiflexion and plantar flexion, strong and equal grip strength Sensation normal to light and sharp touch Moves extremities without ataxia, coordination intact Normal finger to nose No pronator drift Normal gait  Psychiatric:        Behavior: Behavior normal.    ED Results / Procedures / Treatments   Labs (all labs ordered are listed, but only abnormal results are displayed) Labs Reviewed  CBC WITH DIFFERENTIAL/PLATELET - Abnormal; Notable for the following components:      Result Value   WBC 3.4 (*)    All other components within normal limits  BASIC METABOLIC PANEL - Abnormal; Notable for the following components:   BUN 6 (*)    Anion gap 4 (*)    All other components within normal limits  RESP PANEL BY RT-PCR (FLU A&B, COVID) ARPGX2  SEDIMENTATION RATE  C-REACTIVE PROTEIN    EKG None  Radiology CT Angio Head W or Wo Contrast  Result Date: 02/04/2021 CLINICAL DATA:  Neuro deficit, acute, stroke suspected. New headache and ear pain left side. EXAM: CT ANGIOGRAPHY HEAD AND NECK TECHNIQUE: Multidetector CT  imaging of the head and neck was performed using the standard protocol during bolus administration of intravenous contrast. Multiplanar CT image reconstructions and MIPs were obtained to evaluate the vascular anatomy. Carotid stenosis measurements (when applicable) are obtained utilizing NASCET criteria, using the distal internal carotid diameter as the denominator. CONTRAST:  31m OMNIPAQUE IOHEXOL 350 MG/ML SOLN COMPARISON:  Noncontrast head CT 09/04/2020. FINDINGS: CT HEAD FINDINGS Brain: Mild generalized cerebral and cerebellar atrophy. There is no acute intracranial hemorrhage. No demarcated cortical infarct. No extra-axial fluid collection. No evidence of an intracranial mass. No midline shift. Partially empty sella turcica. Vascular: No hyperdense vessel. Skull: No calvarial fracture or focal suspicious calvarial lesion. 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 Orbits: No acute or significant finding. Review of the MIP images confirms the above findings CTA NECK FINDINGS Aortic arch: Common origin of the innominate and left common carotid arteries. Atherosclerotic plaque within the visualized aortic arch and proximal major branch vessels of the neck. No hemodynamically significant innominate or proximal subclavian artery stenosis. Right carotid system: CCA and ICA patent within the neck without stenosis. Mild soft and calcified plaque within the carotid bifurcation and proximal ICA. Left carotid system: CCA and ICA patent within the neck. Soft and calcified plaque within the carotid bifurcation and proximal ICA results in less than 50% stenosis at the origin of the left ICA. Vertebral arteries: Streak and beam hardening artifact arising from a dense right-sided contrast bolus partially obscures the V1 right vertebral artery. Within this  limitation, the vertebral arteries are codominant and patent within the neck without appreciable stenosis. Skeleton: Cervical spondylosis. No acute bony  abnormality or aggressive osseous lesion. Other neck: No neck mass or cervical lymphadenopathy. Upper chest: No consolidation within the imaged lung apices. Review of the MIP images confirms the above findings CTA HEAD FINDINGS Anterior circulation: The intracranial internal carotid arteries are patent. Atherosclerotic plaque within both vessels with no more than mild stenosis. The M1 middle cerebral arteries are patent. No M2 proximal branch occlusion or high-grade proximal stenosis is identified. The anterior cerebral arteries are patent. No intracranial aneurysm is identified. Posterior circulation: The intracranial vertebral arteries are patent. The basilar artery is patent. The posterior cerebral arteries are patent. Posterior communicating arteries are hypoplastic or absent bilaterally. Venous sinuses: Within the limitations of contrast timing, no convincing thrombus. Anatomic variants: As described Review of the MIP images confirms the above findings IMPRESSION: CT head: 1. No evidence of acute intracranial abnormality. 2. Mild generalized parenchymal atrophy. CTA neck: 1. The common carotid and internal carotid arteries are patent within the neck. Atherosclerotic plaque within the bilateral carotid systems within the neck, as described. Soft and calcified plaque within the left carotid bifurcation/proximal left ICA results in less than 50% stenosis at the origin of the left ICA. 2. Streak and beam hardening artifact arising from a dense right-sided contrast bolus partially obscures the V1 right vertebral artery. Within this limitation, the vertebral arteries are patent within the neck without appreciable stenosis. CTA head: 1. No intracranial large vessel occlusion or proximal high-grade arterial stenosis. 2. Atherosclerotic plaque within the intracranial internal carotid arteries bilaterally with no more than mild stenosis. Electronically Signed   By: Kellie Simmering DO   On: 02/04/2021 12:37   CT Angio Neck  W and/or Wo Contrast  Result Date: 02/04/2021 CLINICAL DATA:  Neuro deficit, acute, stroke suspected. New headache and ear pain left side. EXAM: CT ANGIOGRAPHY HEAD AND NECK TECHNIQUE: Multidetector CT imaging of the head and neck was performed using the standard protocol during bolus administration of intravenous contrast. Multiplanar CT image reconstructions and MIPs were obtained to evaluate the vascular anatomy. Carotid stenosis measurements (when applicable) are obtained utilizing NASCET criteria, using the distal internal carotid diameter as the denominator. CONTRAST:  24m OMNIPAQUE IOHEXOL 350 MG/ML SOLN COMPARISON:  Noncontrast head CT 09/04/2020. FINDINGS: CT HEAD FINDINGS Brain: Mild generalized cerebral and cerebellar atrophy. There is no acute intracranial hemorrhage. No demarcated cortical infarct. No extra-axial fluid collection. No evidence of an intracranial mass. No midline shift. Partially empty sella turcica. Vascular: No hyperdense vessel. Skull: No calvarial fracture or focal suspicious calvarial lesion. 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 Orbits: No acute or significant finding. Review of the MIP images confirms the above findings CTA NECK FINDINGS Aortic arch: Common origin of the innominate and left common carotid arteries. Atherosclerotic plaque within the visualized aortic arch and proximal major branch vessels of the neck. No hemodynamically significant innominate or proximal subclavian artery stenosis. Right carotid system: CCA and ICA patent within the neck without stenosis. Mild soft and calcified plaque within the carotid bifurcation and proximal ICA. Left carotid system: CCA and ICA patent within the neck. Soft and calcified plaque within the carotid bifurcation and proximal ICA results in less than 50% stenosis at the origin of the left ICA. Vertebral arteries: Streak and beam hardening artifact arising from a dense right-sided contrast bolus partially  obscures the V1 right vertebral artery. Within this limitation, the vertebral arteries are  codominant and patent within the neck without appreciable stenosis. Skeleton: Cervical spondylosis. No acute bony abnormality or aggressive osseous lesion. Other neck: No neck mass or cervical lymphadenopathy. Upper chest: No consolidation within the imaged lung apices. Review of the MIP images confirms the above findings CTA HEAD FINDINGS Anterior circulation: The intracranial internal carotid arteries are patent. Atherosclerotic plaque within both vessels with no more than mild stenosis. The M1 middle cerebral arteries are patent. No M2 proximal branch occlusion or high-grade proximal stenosis is identified. The anterior cerebral arteries are patent. No intracranial aneurysm is identified. Posterior circulation: The intracranial vertebral arteries are patent. The basilar artery is patent. The posterior cerebral arteries are patent. Posterior communicating arteries are hypoplastic or absent bilaterally. Venous sinuses: Within the limitations of contrast timing, no convincing thrombus. Anatomic variants: As described Review of the MIP images confirms the above findings IMPRESSION: CT head: 1. No evidence of acute intracranial abnormality. 2. Mild generalized parenchymal atrophy. CTA neck: 1. The common carotid and internal carotid arteries are patent within the neck. Atherosclerotic plaque within the bilateral carotid systems within the neck, as described. Soft and calcified plaque within the left carotid bifurcation/proximal left ICA results in less than 50% stenosis at the origin of the left ICA. 2. Streak and beam hardening artifact arising from a dense right-sided contrast bolus partially obscures the V1 right vertebral artery. Within this limitation, the vertebral arteries are patent within the neck without appreciable stenosis. CTA head: 1. No intracranial large vessel occlusion or proximal high-grade arterial stenosis.  2. Atherosclerotic plaque within the intracranial internal carotid arteries bilaterally with no more than mild stenosis. Electronically Signed   By: Kellie Simmering DO   On: 02/04/2021 12:37    Procedures Procedures   Medications Ordered in ED Medications  sodium chloride 0.9 % bolus 500 mL (0 mLs Intravenous Stopped 02/04/21 1333)  acetaminophen (TYLENOL) tablet 650 mg (650 mg Oral Given 02/04/21 1058)  iohexol (OMNIPAQUE) 350 MG/ML injection 75 mL (75 mLs Intravenous Contrast Given 02/04/21 1157)    ED Course  I have reviewed the triage vital signs and the nursing notes.  Pertinent labs & imaging results that were available during my care of the patient were reviewed by me and considered in my medical decision making (see chart for details).    MDM Rules/Calculators/A&P                          Additional history obtained from: Nursing notes from this visit. ------------ 70 year old female presented for pain behind her left ear started around 2 days ago no injury.  No recent infection and no neurologic complaint.  Pain is random but will sometimes worsen with loud noises.  On physical exam patient is well-appearing no acute distress.  Cranial nerves intact, no meningeal signs.  Airway clear without evidence of PTA, RPA, Ludwick's, tonsillitis or other deep space infections.  Additionally left TM is clear, no evidence for otitis media, otitis externa or mastoiditis on exam.  Neuro examination is reassuring.  Case discussed with attending physician Dr. Roderic Palau will obtain labs and CT head. --------- COVID/influenza panel negative. CRP negative. ESR within normal limits. BMP shows no emergent electrolyte derangement, AKI or elevated anion gap. CBC shows mild leukopenia at 3.4, no anemia or thrombocytopenia.  CT angio head/neck: IMPRESSION:  CT head:     1. No evidence of acute intracranial abnormality.  2. Mild generalized parenchymal atrophy.     CTA neck:  1. The common carotid  and internal carotid arteries are patent  within the neck. Atherosclerotic plaque within the bilateral carotid  systems within the neck, as described. Soft and calcified plaque  within the left carotid bifurcation/proximal left ICA results in  less than 50% stenosis at the origin of the left ICA.  2. Streak and beam hardening artifact arising from a dense  right-sided contrast bolus partially obscures the V1 right vertebral  artery. Within this limitation, the vertebral arteries are patent  within the neck without appreciable stenosis.     CTA head:     1. No intracranial large vessel occlusion or proximal high-grade  arterial stenosis.  2. Atherosclerotic plaque within the intracranial internal carotid  arteries bilaterally with no more than mild stenosis.  ----------  Case was discussed with Dr. Roderic Palau, plan will be to have patient follow-up outpatient with neurology.I went to update the patient but she was in the bathroom.  Shortly afterwards I was advised by nursing staff that patient eloped from the emergency department and left AMA by nursing staff.    I was able to contact the patient on her cellular phone and confirmed her identity.  Patient reported that her pain has resolved following Tylenol and she had to leave.  I updated patient on CT findings above and she stated understanding.  I encouraged the patient to call her primary care provider today to schedule a follow-up appointment regarding the atherosclerosis seen on the angiogram.  I then referred the patient to Dr. Merlene Laughter for further evaluation of headache.  Patient informed of ER return precautions and that she may return for further evaluation treatment at any time.  Low suspicion for dissection, meningitis, SAH, arteritis or other emergent etiologies of headache at this time.   At this time there does not appear to be any evidence of an acute emergency medical condition and the patient appears stable for discharge with  appropriate outpatient follow up. Diagnosis was discussed with patient who verbalizes understanding of care plan and is agreeable to discharge. I have discussed return precautions with patient who verbalizes understanding. Patient encouraged to follow-up with their PCP and neurology. All questions answered.  Note: Portions of this report may have been transcribed using voice recognition software. Every effort was made to ensure accuracy; however, inadvertent computerized transcription errors may still be present.  Final Clinical Impression(s) / ED Diagnoses Final diagnoses:  Acute nonintractable headache, unspecified headache type    Rx / DC Orders ED Discharge Orders     None        Gari Crown 02/04/21 1441    Milton Ferguson, MD 02/05/21 1016

## 2021-02-04 NOTE — ED Triage Notes (Signed)
Pt c/o pain behind left ear that is throbbing and pulsating since Friday at 5pm. Denies ear drainage or difficulty hearing.

## 2021-02-04 NOTE — Telephone Encounter (Signed)
Please check on patient regarding pain and also offer follow-up regarding lipids/carotid.  Thanks.

## 2021-02-04 NOTE — ED Notes (Signed)
Pt requested for her IV to be taken out. RN went in to speak with pt about the IV and she said it was bothering her. IV was removed per her request. Pt also stated that she was ready to leave. RN offered to speak with EDP about coming to update her on her plan of care, but pt stated, "Will if he had done that, I wouldn't have to be doing this". RN requested pt to sign the AMA paperwork prior to leaving, but she refused stating, "I'm not signing anything". EDP made aware of pt leaving.

## 2021-02-04 NOTE — Discharge Instructions (Addendum)
At this time there does not appear to be the presence of an emergent medical condition, however there is always the potential for conditions to change. Please read and follow the below instructions.  Please return to the Emergency Department immediately for any new or worsening symptoms. Please be sure to follow up with your Primary Care Provider within one week regarding your visit today; please call their office to schedule an appointment even if you are feeling better for a follow-up visit. Please call the neurologist Dr. Merlene Laughter under discharge paperwork for further evaluation of your headache. You may use Tylenol as directed on the packaging to help with headache.  Do not take more than 3000 mg of Tylenol in 24 hours. Your CT scan today showed some atherosclerosis and stenosis within the arteries of your neck.  Please discuss these findings with your primary care provider at your follow-up visit this week.  Go to the nearest Emergency Department immediately if: You have fever or chills Your headache gets very bad quickly. Your headache gets worse after a lot of physical activity. You keep throwing up. You have a stiff neck. You have trouble seeing. You have trouble speaking. You have pain in the eye or ear. Your muscles are weak or you lose muscle control. You lose your balance or have trouble walking. You feel like you will pass out (faint) or you pass out. You are mixed up (confused). You have a seizure. You have any new/concerning or worsening of symptoms.   Please read the additional information packets attached to your discharge summary.  Do not take your medicine if  develop an itchy rash, swelling in your mouth or lips, or difficulty breathing; call 911 and seek immediate emergency medical attention if this occurs.  You may review your lab tests and imaging results in their entirety on your MyChart account.  Please discuss all results of fully with your primary care provider  and other specialist at your follow-up visit.  Note: Portions of this text may have been transcribed using voice recognition software. Every effort was made to ensure accuracy; however, inadvertent computerized transcription errors may still be present.

## 2021-02-06 ENCOUNTER — Telehealth: Payer: Self-pay | Admitting: Family Medicine

## 2021-02-06 NOTE — Telephone Encounter (Signed)
See other TE note. 

## 2021-02-06 NOTE — Telephone Encounter (Signed)
Mrs. Hartfield called in and she stated that she was in the ER this past weekend and was told to go see Dr. Merlene Laughter and when she called they told her that she would need a referral from Dr. Damita Dunnings.   Please advise

## 2021-02-06 NOTE — Telephone Encounter (Signed)
Patient notified referral was done. F/u appt scheduled for 02/15/21 at 8:00 am.

## 2021-02-06 NOTE — Telephone Encounter (Signed)
Patient needs referral to Dr. Lisette Grinder with Neurology for severe pulsing headaches that started Friday and have been pretty constant. I advised patient she may need to be seen her for this before referral can be done. Do you want patient to make a f/u here this week if possible or can referral be done without her being seen? Also does she need labs done?

## 2021-02-06 NOTE — Telephone Encounter (Signed)
Patient called this morning and left message in another TE:  [name] called in and she stated that she was in the ER this past weekend and was told to go see Dr. Merlene Laughter and when she called they told her that she would need a referral from Dr. Damita Dunnings.   Please advise

## 2021-02-06 NOTE — Addendum Note (Signed)
Addended by: Tonia Ghent on: 02/06/2021 02:02 PM   Modules accepted: Orders

## 2021-02-06 NOTE — Telephone Encounter (Addendum)
I put in the referral.  If new neuro sx then she needs recheck in the meantime.  Would still offer/encouraged f/u here re: lipids when possible.  Thanks.

## 2021-02-15 ENCOUNTER — Encounter: Payer: Self-pay | Admitting: Family Medicine

## 2021-02-15 ENCOUNTER — Other Ambulatory Visit: Payer: Self-pay

## 2021-02-15 ENCOUNTER — Ambulatory Visit (INDEPENDENT_AMBULATORY_CARE_PROVIDER_SITE_OTHER): Payer: Medicare HMO | Admitting: Family Medicine

## 2021-02-15 DIAGNOSIS — E7800 Pure hypercholesterolemia, unspecified: Secondary | ICD-10-CM

## 2021-02-15 DIAGNOSIS — M5481 Occipital neuralgia: Secondary | ICD-10-CM

## 2021-02-15 NOTE — Patient Instructions (Signed)
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  If persistent then let me know.  Use tylenol in the meantime.  Call about eye exam and I'll check with cardiology about your lipids.  Take care.  Glad to see you.

## 2021-02-15 NOTE — Progress Notes (Signed)
This visit occurred during the SARS-CoV-2 public health emergency.  Safety protocols were in place, including screening questions prior to the visit, additional usage of staff PPE, and extensive cleaning of exam room while observing appropriate contact time as indicated for disinfecting solutions.  ER eval for L sided HA.  Went on for 2 days.  Pain shot up L side of scalp and down the neck.  Didn't cross the midline.  Pain went away when going to sleep but returned on waking.  She recently changed pillows.  No focal neuro sx o/w.  No vision changes.  No weakness.    She had some changes on the interior of R>L lid. She will f/u with [facility] and update me as needed.  Carotid changes d/w pt.  Statin intolerant.  D/w pt about cardiology f/u.    Meds, vitals, and allergies reviewed.   ROS: Per HPI unless specifically indicated in ROS section   GEN: nad, alert and oriented HEENT: ncat, PERRL and EOMI but it looks like she has some small benign-appearing papules noted on the interior lateral portion on the right lower lid NECK: supple w/o LA CV: rrr.  PULM: ctab, no inc wob ABD: soft, +bs EXT: no edema Skin well perfused CN 2-12 wnl B, S/S wnl x4

## 2021-02-18 DIAGNOSIS — M5481 Occipital neuralgia: Secondary | ICD-10-CM | POA: Insufficient documentation

## 2021-02-18 NOTE — Assessment & Plan Note (Signed)
She is statin intolerant and I need input from Dr. Percival Spanish regarding options going forward.  The common carotid and internal carotid arteries are patent within the neck. Atherosclerotic plaque within the bilateral carotid systems within the neck, as described. Soft and calcified plaque within the left carotid bifurcation/proximal left ICA results in less than 50% stenosis at the origin of the left ICA.

## 2021-02-18 NOTE — Assessment & Plan Note (Signed)
It sounds like this was the issue and discussed with patient about options/anatomy.  She can use Tylenol as needed.  Discussed potentially using gabapentin in the future but she would not need that now.  She wanted to minimize medications.  She can update me as needed.

## 2021-04-11 ENCOUNTER — Other Ambulatory Visit (HOSPITAL_COMMUNITY): Payer: Self-pay | Admitting: Family Medicine

## 2021-04-11 DIAGNOSIS — Z1231 Encounter for screening mammogram for malignant neoplasm of breast: Secondary | ICD-10-CM

## 2021-04-24 ENCOUNTER — Telehealth: Payer: Self-pay | Admitting: Family Medicine

## 2021-04-24 DIAGNOSIS — E7800 Pure hypercholesterolemia, unspecified: Secondary | ICD-10-CM

## 2021-04-24 NOTE — Telephone Encounter (Signed)
Pt called she wanted to know if she should be on a statin medication for her cholesterol also what are the pro's and con's of statin medication

## 2021-04-25 NOTE — Telephone Encounter (Signed)
Spoke with patient and she stated she would like to go back on a statin. She stated that she did have muscle aches but states she always has aches from working out and such so she does not think it was from the medication.

## 2021-04-25 NOTE — Telephone Encounter (Signed)
The main benefit is decreasing the risk of a heart attack or stroke.  The main problem with statins would be muscle aches.  I thought she was statin intolerant due to previous muscle aches.  I need input from Dr. Percival Spanish about statin alternatives.  I do not know if she would be a candidate for PSK inhibitor instead of a statin.  I think the patient is due for follow-up at his clinic and I will route this note to him concurrently for his input.

## 2021-04-27 MED ORDER — PRAVASTATIN SODIUM 20 MG PO TABS: 20.0000 mg | ORAL_TABLET | Freq: Every day | ORAL | 3 refills | Status: DC

## 2021-04-27 NOTE — Telephone Encounter (Signed)
Patient notified rx was sent and lab appt made for 07/26/20 at 7:30 am.

## 2021-04-27 NOTE — Telephone Encounter (Signed)
See below.  I got input from Dr. Percival Spanish.  I appreciate his advice.  I would try pravastatin 20 mg.  If she has muscle aches on medication then let me know.  We can recheck her lipids in about 3 months.  I put in the orders for a follow-up lab test.  She will need a fasting lab appointment.  Thanks.  ======================  Minus Breeding, MD  You 21 hours ago (5:31 PM)   I think pravastatin rarely causes muscle aches.  I would start with this and at least 20 mg.

## 2021-04-27 NOTE — Addendum Note (Signed)
Addended by: Tonia Ghent on: 04/27/2021 03:23 PM   Modules accepted: Orders

## 2021-05-09 ENCOUNTER — Ambulatory Visit (HOSPITAL_COMMUNITY): Payer: Medicare HMO

## 2021-05-10 DIAGNOSIS — H02889 Meibomian gland dysfunction of unspecified eye, unspecified eyelid: Secondary | ICD-10-CM | POA: Diagnosis not present

## 2021-05-18 ENCOUNTER — Ambulatory Visit (HOSPITAL_COMMUNITY)
Admission: RE | Admit: 2021-05-18 | Discharge: 2021-05-18 | Disposition: A | Discharge status: Home / Self Care | Payer: Medicare HMO | Source: Ambulatory Visit | Attending: Family Medicine | Admitting: Family Medicine

## 2021-05-18 ENCOUNTER — Telehealth: Payer: Self-pay | Admitting: Family Medicine

## 2021-05-18 ENCOUNTER — Encounter: Payer: Self-pay | Admitting: Family Medicine

## 2021-05-18 ENCOUNTER — Ambulatory Visit (INDEPENDENT_AMBULATORY_CARE_PROVIDER_SITE_OTHER): Payer: Medicare HMO | Admitting: Family Medicine

## 2021-05-18 ENCOUNTER — Other Ambulatory Visit: Payer: Self-pay

## 2021-05-18 VITALS — BP 140/72 | HR 74 | Temp 97.9°F | Ht 65.0 in | Wt 188.0 lb

## 2021-05-18 DIAGNOSIS — R1032 Left lower quadrant pain: Secondary | ICD-10-CM | POA: Diagnosis not present

## 2021-05-18 DIAGNOSIS — Z1231 Encounter for screening mammogram for malignant neoplasm of breast: Secondary | ICD-10-CM | POA: Diagnosis not present

## 2021-05-18 LAB — POC URINALSYSI DIPSTICK (AUTOMATED)
Bilirubin, UA: NEGATIVE
Blood, UA: NEGATIVE
Glucose, UA: NEGATIVE
Ketones, UA: NEGATIVE
Nitrite, UA: NEGATIVE
Protein, UA: NEGATIVE
Spec Grav, UA: 1.01 (ref 1.010–1.025)
Urobilinogen, UA: 0.2 E.U./dL
pH, UA: 6 (ref 5.0–8.0)

## 2021-05-18 LAB — POCT UA - MICROSCOPIC ONLY

## 2021-05-18 MED ORDER — METRONIDAZOLE 500 MG PO TABS: 500.0000 mg | ORAL_TABLET | Freq: Four times a day (QID) | ORAL | 0 refills | Status: DC

## 2021-05-18 MED ORDER — TRAMADOL HCL 50 MG PO TABS: 50.0000 mg | ORAL_TABLET | Freq: Three times a day (TID) | ORAL | 0 refills | Status: AC | PRN

## 2021-05-18 MED ORDER — CIPROFLOXACIN HCL 750 MG PO TABS: 750.0000 mg | ORAL_TABLET | Freq: Two times a day (BID) | ORAL | 0 refills | Status: DC

## 2021-05-18 NOTE — Patient Instructions (Addendum)
Can use tylenol 500 mg 2 tabs every 8 hours for pain. Can use tramadol for breakthrough pain.  If pain worsening.. treat with antibiotics.  If severe  abdominal pain pain go to ER.  Follow up early next week with PCP if not resolved.

## 2021-05-18 NOTE — Telephone Encounter (Addendum)
Okay me. Routed to Dr. Jacinto Reap.  And I wish this kind lady the best.  Thanks.

## 2021-05-18 NOTE — Progress Notes (Signed)
Patient ID: [name], female    DOB: 02/05/1951, 70 y.o.   MRN: [id number]  This visit was conducted in person.  BP 140/72   Pulse 74   Temp 97.9 F (36.6 C) (Temporal)   Ht 5\' 5"  (1.651 m)   Wt 188 lb (85.3 kg)   SpO2 99%   BMI 31.28 kg/m    CC: Chief Complaint  Patient presents with  . LLQ Pain    Hx of Diverticulitis    Subjective:   HPI: [name] is a 70 y.o. female  pt of Dr. Josefine Class presenting on 05/18/2021 for LLQ Pain (Hx of Diverticulitis)    New onset pain in left lower quadrant pain x 5 days... gradually improving pain, but  now radiating to left mid back which is unusual for her. 3/10 on pain scale.  No fever,  mild constipation ( firmer and some straining), no diarrhea.  No N/V.   No dysuria, no frequency, no urgency.  She did have popcorn  day prior which may have triggered.   Hx of diverticulitis. Last infection earlier this year in 01/09/2021.Marland Kitchen antibiotics not required, treated with tramadol.   Using tylenol for pain... 2 at night.   Good UOP, go po intake.        Relevant past medical, surgical, family and social history reviewed and updated as indicated. Interim medical history since our last visit reviewed. Allergies and medications reviewed and updated. Outpatient Medications Prior to Visit  Medication Sig Dispense Refill  . b complex vitamins tablet Take 1 tablet by mouth daily.    . Black Currant Seed Oil 500 MG CAPS Take 1 capsule by mouth daily.    . cholecalciferol (VITAMIN D3) 25 MCG (1000 UNIT) tablet Take 1,000 Units by mouth daily.    . fluticasone (FLONASE) 50 MCG/ACT nasal spray Place 2 sprays into both nostrils daily. 16 g 12  . pravastatin (PRAVACHOL) 20 MG tablet Take 1 tablet (20 mg total) by mouth daily. 90 tablet 3  . verapamil (CALAN-SR) 240 MG CR tablet TAKE 1 TABLET EVERY DAY 90 tablet 3   No facility-administered medications prior to visit.     Per HPI unless specifically indicated in ROS section  below Review of Systems  Constitutional:  Negative for fatigue and fever.  HENT:  Negative for ear pain.   Eyes:  Negative for pain.  Respiratory:  Negative for chest tightness and shortness of breath.   Cardiovascular:  Negative for chest pain, palpitations and leg swelling.  Gastrointestinal:  Negative for abdominal pain.  Genitourinary:  Negative for dysuria.  Objective:  BP 140/72   Pulse 74   Temp 97.9 F (36.6 C) (Temporal)   Ht 5\' 5"  (1.651 m)   Wt 188 lb (85.3 kg)   SpO2 99%   BMI 31.28 kg/m   Wt Readings from Last 3 Encounters:  05/18/21 188 lb (85.3 kg)  02/15/21 187 lb (84.8 kg)  02/04/21 185 lb (83.9 kg)      Physical Exam Constitutional:      General: She is not in acute distress.    Appearance: Normal appearance. She is well-developed. She is not ill-appearing or toxic-appearing.  HENT:     Head: Normocephalic.     Right Ear: Hearing, tympanic membrane, ear canal and external ear normal. Tympanic membrane is not erythematous, retracted or bulging.     Left Ear: Hearing, tympanic membrane, ear canal and external ear normal. Tympanic membrane is not erythematous, retracted or  bulging.     Nose: No mucosal edema or rhinorrhea.     Right Sinus: No maxillary sinus tenderness or frontal sinus tenderness.     Left Sinus: No maxillary sinus tenderness or frontal sinus tenderness.     Mouth/Throat:     Pharynx: Uvula midline.  Eyes:     General: Lids are normal. Lids are everted, no foreign bodies appreciated.     Conjunctiva/sclera: Conjunctivae normal.     Pupils: Pupils are equal, round, and reactive to light.  Neck:     Thyroid: No thyroid mass or thyromegaly.     Vascular: No carotid bruit.     Trachea: Trachea normal.  Cardiovascular:     Rate and Rhythm: Normal rate and regular rhythm.     Pulses: Normal pulses.     Heart sounds: Normal heart sounds, S1 normal and S2 normal. No murmur heard.   No friction rub. No gallop.  Pulmonary:     Effort:  Pulmonary effort is normal. No tachypnea or respiratory distress.     Breath sounds: Normal breath sounds. No decreased breath sounds, wheezing, rhonchi or rales.  Abdominal:     General: Bowel sounds are normal.     Palpations: Abdomen is soft.     Tenderness: There is abdominal tenderness in the left lower quadrant. There is guarding. There is no rebound. Negative signs include Murphy's sign and McBurney's sign.  Musculoskeletal:     Cervical back: Normal range of motion and neck supple.  Skin:    General: Skin is warm and dry.     Findings: No rash.  Neurological:     Mental Status: She is alert.  Psychiatric:        Mood and Affect: Mood is not anxious or depressed.        Speech: Speech normal.        Behavior: Behavior normal. Behavior is cooperative.        Thought Content: Thought content normal.        Judgment: Judgment normal.      Results for orders placed or performed during the hospital encounter of 02/04/21  Resp Panel by RT-PCR (Flu A&B, Covid) Nasopharyngeal Swab   Specimen: Nasopharyngeal Swab; Nasopharyngeal(NP) swabs in vial transport medium  Result Value Ref Range   SARS Coronavirus 2 by RT PCR NEGATIVE NEGATIVE   Influenza A by PCR NEGATIVE NEGATIVE   Influenza B by PCR NEGATIVE NEGATIVE  CBC with Differential  Result Value Ref Range   WBC 3.4 (L) 4.0 - 10.5 K/uL   RBC 4.53 3.87 - 5.11 MIL/uL   Hemoglobin 12.4 12.0 - 15.0 g/dL   HCT 39.7 36.0 - 46.0 %   MCV 87.6 80.0 - 100.0 fL   MCH 27.4 26.0 - 34.0 pg   MCHC 31.2 30.0 - 36.0 g/dL   RDW 13.8 11.5 - 15.5 %   Platelets 192 150 - 400 K/uL   nRBC 0.0 0.0 - 0.2 %   Neutrophils Relative % 50 %   Neutro Abs 1.7 1.7 - 7.7 K/uL   Lymphocytes Relative 35 %   Lymphs Abs 1.2 0.7 - 4.0 K/uL   Monocytes Relative 9 %   Monocytes Absolute 0.3 0.1 - 1.0 K/uL   Eosinophils Relative 5 %   Eosinophils Absolute 0.2 0.0 - 0.5 K/uL   Basophils Relative 1 %   Basophils Absolute 0.0 0.0 - 0.1 K/uL   Immature  Granulocytes 0 %   Abs Immature Granulocytes 0.01 0.00 - 0.07  K/uL  Basic metabolic panel  Result Value Ref Range   Sodium 138 135 - 145 mmol/L   Potassium 4.3 3.5 - 5.1 mmol/L   Chloride 104 98 - 111 mmol/L   CO2 30 22 - 32 mmol/L   Glucose, Bld 97 70 - 99 mg/dL   BUN 6 (L) 8 - 23 mg/dL   Creatinine, Ser 0.62 0.44 - 1.00 mg/dL   Calcium 9.5 8.9 - 10.3 mg/dL   GFR, Estimated >60 >60 mL/min   Anion gap 4 (L) 5 - 15  Sedimentation rate  Result Value Ref Range   Sed Rate 10 0 - 22 mm/hr  C-reactive protein  Result Value Ref Range   CRP <0.5 <1.0 mg/dL    This visit occurred during the SARS-CoV-2 public health emergency.  Safety protocols were in place, including screening questions prior to the visit, additional usage of staff PPE, and extensive cleaning of exam room while observing appropriate contact time as indicated for disinfecting solutions.   COVID 19 screen:  No recent travel or known exposure to COVID19 The patient denies respiratory symptoms of COVID 19 at this time. The importance of social distancing was discussed today.   Assessment and Plan    Problem List Items Addressed This Visit     Abdominal pain, LLQ - Primary    Likely acute diverticulitis. UA clear and no S/S of renal stone/cystitis, pyelo etc.  No clear complicating features.  Good po intake.  ER precautions given.   Will treat with pain control. Tylenol, tramadol for break through pain.  Given rx for cipro/flagyl to use if worsening over weekend.  Return early next week if symptoms not improving.      Relevant Orders   POCT Urinalysis Dipstick (Automated) (Completed)   POCT UA - Microscopic Only (Completed)    Meds ordered this encounter  Medications  . ciprofloxacin (CIPRO) 750 MG tablet    Sig: Take 1 tablet (750 mg total) by mouth 2 (two) times daily.    Dispense:  20 tablet    Refill:  0  . metroNIDAZOLE (FLAGYL) 500 MG tablet    Sig: Take 1 tablet (500 mg total) by mouth every 6 (six)  hours.    Dispense:  40 tablet    Refill:  0  . traMADol (ULTRAM) 50 MG tablet    Sig: Take 1 tablet (50 mg total) by mouth every 8 (eight) hours as needed for up to 5 days.    Dispense:  15 tablet    Refill:  0     Eliezer Lofts, MD

## 2021-05-18 NOTE — Assessment & Plan Note (Signed)
Likely acute diverticulitis. UA clear and no S/S of renal stone/cystitis, pyelo etc.  No clear complicating features.  Good po intake.  ER precautions given.   Will treat with pain control. Tylenol, tramadol for break through pain.  Given rx for cipro/flagyl to use if worsening over weekend.  Return early next week if symptoms not improving.

## 2021-05-18 NOTE — Telephone Encounter (Signed)
[name] wanted to know about getting the okay switching providers from Dr. Damita Dunnings to Dr. Diona Browner. She stated that Dr. Diona Browner gave the okay

## 2021-05-22 ENCOUNTER — Telehealth: Payer: Self-pay | Admitting: Internal Medicine

## 2021-05-23 NOTE — Telephone Encounter (Signed)
I am not familiar with whatever research study she is referring to, but I am happy to discuss it further with her in an office appointment. She is also due for an appointment with me so can schedule that.

## 2021-05-23 NOTE — Telephone Encounter (Signed)
I have called and spoke with pt and she stated that she is trying to be a part of a research study and they sent her an email and told her that she would need to have a CT scan scheduled.  ND please advise. Thanks

## 2021-05-28 ENCOUNTER — Encounter: Payer: Self-pay | Admitting: Internal Medicine

## 2021-05-28 ENCOUNTER — Ambulatory Visit: Payer: Medicare HMO | Admitting: Internal Medicine

## 2021-05-28 ENCOUNTER — Other Ambulatory Visit: Payer: Self-pay

## 2021-05-28 VITALS — BP 120/68 | HR 68 | Temp 98.1°F | Ht 65.0 in | Wt 190.4 lb

## 2021-05-28 DIAGNOSIS — Z23 Encounter for immunization: Secondary | ICD-10-CM

## 2021-05-28 DIAGNOSIS — R053 Chronic cough: Secondary | ICD-10-CM | POA: Diagnosis not present

## 2021-05-28 NOTE — Progress Notes (Signed)
[name]    [id number]    [birth year]  Primary Care Physician:Duncan, Elveria Rising, MD Date of Appointment: 05/28/2021 Established Patient Visit  Chief complaint:   Chief Complaint  Patient presents with   Follow-up    Pt states she has been doing okay since last visit. States she still has an occ cough and also states she wakes up with bad indigestion.     HPI: [name] is a 70 y.o. woman with chronic cough related to GERD   Interval Updates: Here for follow up. She was taking claritin and flonase for rhinitis. Did not help.  Not on any medications for GERD. She notes that coughing is worse than ever. Baking soda is no longer helping. She is not interested in any medication for this.   She is here because she got an email about a research study and wants a CT scan to study lung cancer. She was told she didn't qualify for the study because she doesn't have a CT scan scheduled.   Of note she was getting LDCT for lung cancer screening but her last CT scan was in 2021 which was benign. She is now more than 15 years out from smoking.  She is very concerned about lung cancer and wants another CT scan. She wants to enroll in this study.   She showed me the email - it is not from a university or government agency. Best I can tell it is from a company called [employer].    I have reviewed the patient's family social and past medical history and updated as appropriate.   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   Hyperlipidemia    Hypertrophic cardiomyopathy (HCC)    Palpitations    Trigger finger of left hand 09/2012   left long finger    Past Surgical History:  Procedure Laterality Date   BREAST REDUCTION SURGERY Bilateral 2001   CHOLECYSTECTOMY  07/17/2011   Procedure: LAPAROSCOPIC  CHOLECYSTECTOMY WITH INTRAOPERATIVE CHOLANGIOGRAM;  Surgeon: Haywood Lasso, MD;  Location: [facility];  Service: General;  Laterality: N/A;   COLONOSCOPY     FOOT GANGLION EXCISION Right    [place] Right 1996   thumb   MYOMECTOMY  10/09/99   x 2   OOPHORECTOMY Right 1980   OOPHORECTOMY Left [place]   PARTIAL HYSTERECTOMY  2002   with no remaining ovary   POLYPECTOMY     TRIGGER FINGER RELEASE Left 09/28/2012   Procedure: RELEASE TRIGGER FINGER/A-1 PULLEY;  Surgeon: Jolyn Nap, MD;  Location: [facility];  Service: Orthopedics;  Laterality: Left;   UPPER GASTROINTESTINAL ENDOSCOPY      Family History  Problem Relation Age of Onset   Colon cancer Father 35   Hepatitis C Brother    Diabetes Sister    Hypertension Brother    Hepatitis C Brother    Colon cancer Paternal Aunt        dx'd in her 42's    Breast cancer Paternal Aunt    Lung cancer Paternal Uncle    Breast cancer Cousin    Liver cancer Cousin  Colon cancer Other        cousin    Colon polyps Neg Hx    Esophageal cancer Neg Hx    Rectal cancer Neg Hx    Stomach cancer Neg Hx     Social History   Occupational History   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 of   Sexual activity: Yes    Birth control/protection: Surgical     Physical Exam: Blood pressure 120/68, pulse 68, temperature 98.1 F (36.7 C), temperature source Oral, height 5\' 5"  (1.651 m), weight 190 lb 6.4 oz (86.4 kg), SpO2 100 %.  Gen:      No acute  distress ENT:  no nasal polyps, mucus membranes moist Lungs:    No increased respiratory effort, symmetric chest wall excursion, clear to auscultation bilaterally, no wheezes or crackles CV:         Regular rate and rhythm; no murmurs, rubs, or gallops.  No pedal edema   Data Reviewed: Imaging: I have personally reviewed the CT Chest Feb 2021 shows mild centrilobular emphysema, and Lung rads 2, no nodules or masses.   PFTs: No flowsheet data found.   Labs:  Immunization status: Immunization History  Administered Date(s) Administered   Fluad Quad(high Dose 65+) 05/05/2020, 05/28/2021   Hep A / Hep B 06/20/2011, 07/25/2011   Hepatitis B 12/27/2011   Influenza Split 06/10/2012   Influenza Whole 06/07/2006, 04/21/2008   Influenza,inj,Quad PF,6+ Mos 03/18/2013, 03/15/2014, 05/09/2015, 07/31/2017, 05/13/2018, 04/01/2019   Moderna Sars-Covid-2 Vaccination 07/30/2019, 08/25/2019, 05/02/2020, 10/31/2020   Pneumococcal Conjugate-13 06/06/2015   Pneumococcal Polysaccharide-23 04/27/2013   Td 06/11/2005   Tdap 09/06/2014   Zoster, Live 04/27/2013    Assessment:  History of tobacco use disorder GERD not well controlled Family history of lung cancer  Plan/Recommendations:  She declines any interventions for her GERD related coughing including medications and referral to GI.   We talked extensively about the current USPSTF guidelines for lung cancer screening and how she no longer qualifies due to quitting smoking over 15 years ago.  I did tell her that unnecessary CT scans can lead to accumulation of radiation exposure over time which can lead to increase risk for malignancy.   I think her email was probably a scam. I recommended clinicaltrials.gov for legitimate clinical trial information.   She did tell me that "just because you people have decided I don't need a scan doesn't mean I want to sit at home and wait to die from lung cancer."   I offered her a second opinion which she  declined.   We discussed PFTs for COPD evaluation and she declined.    Return to Care: As needed.    Lenice Llamas, MD [facility] [place]

## 2021-07-24 ENCOUNTER — Other Ambulatory Visit: Payer: Self-pay | Admitting: Family Medicine

## 2021-07-26 ENCOUNTER — Other Ambulatory Visit (INDEPENDENT_AMBULATORY_CARE_PROVIDER_SITE_OTHER): Payer: Medicare HMO

## 2021-07-26 ENCOUNTER — Other Ambulatory Visit: Payer: Self-pay

## 2021-07-26 DIAGNOSIS — E7800 Pure hypercholesterolemia, unspecified: Secondary | ICD-10-CM

## 2021-07-26 DIAGNOSIS — R202 Paresthesia of skin: Secondary | ICD-10-CM

## 2021-07-26 DIAGNOSIS — I10 Essential (primary) hypertension: Secondary | ICD-10-CM

## 2021-07-26 LAB — COMPREHENSIVE METABOLIC PANEL
ALT: 31 U/L (ref 0–35)
AST: 26 U/L (ref 0–37)
Albumin: 4.4 g/dL (ref 3.5–5.2)
Alkaline Phosphatase: 60 U/L (ref 39–117)
BUN: 12 mg/dL (ref 6–23)
CO2: 31 mEq/L (ref 19–32)
Calcium: 9.9 mg/dL (ref 8.4–10.5)
Chloride: 104 mEq/L (ref 96–112)
Creatinine, Ser: 0.66 mg/dL (ref 0.40–1.20)
GFR: 87.67 mL/min (ref >60.00)
Glucose, Bld: 114 mg/dL — ABNORMAL HIGH (ref 70–99)
Potassium: 4.6 mEq/L (ref 3.5–5.1)
Sodium: 141 mEq/L (ref 135–145)
Total Bilirubin: 0.4 mg/dL (ref 0.2–1.2)
Total Protein: 7.4 g/dL (ref 6.0–8.3)

## 2021-07-26 LAB — CBC WITH DIFFERENTIAL/PLATELET
Basophils Absolute: 0 10*3/uL (ref 0.0–0.1)
Basophils Relative: 1.1 % (ref 0.0–3.0)
Eosinophils Absolute: 0.1 10*3/uL (ref 0.0–0.7)
Eosinophils Relative: 3.8 % (ref 0.0–5.0)
HCT: 40.1 % (ref 36.0–46.0)
Hemoglobin: 12.9 g/dL (ref 12.0–15.0)
Lymphocytes Relative: 32 % (ref 12.0–46.0)
Lymphs Abs: 1.2 10*3/uL (ref 0.7–4.0)
MCHC: 32.3 g/dL (ref 30.0–36.0)
MCV: 84.9 fl (ref 78.0–100.0)
Monocytes Absolute: 0.4 10*3/uL (ref 0.1–1.0)
Monocytes Relative: 10.4 % (ref 3.0–12.0)
Neutro Abs: 2 10*3/uL (ref 1.4–7.7)
Neutrophils Relative %: 52.7 % (ref 43.0–77.0)
Platelets: 190 10*3/uL (ref 150.0–400.0)
RBC: 4.72 Mil/uL (ref 3.87–5.11)
RDW: 13.9 % (ref 11.5–15.5)
WBC: 3.9 10*3/uL — ABNORMAL LOW (ref 4.0–10.5)

## 2021-07-26 LAB — LIPID PANEL
Cholesterol: 216 mg/dL — ABNORMAL HIGH (ref 0–200)
HDL: 45 mg/dL (ref >39.00)
LDL Cholesterol: 150 mg/dL — ABNORMAL HIGH (ref 0–99)
NonHDL: 171.14
Total CHOL/HDL Ratio: 5
Triglycerides: 106 mg/dL (ref 0.0–149.0)
VLDL: 21.2 mg/dL (ref 0.0–40.0)

## 2021-07-26 LAB — TSH: TSH: 1.11 u[IU]/mL (ref 0.35–5.50)

## 2021-07-26 LAB — VITAMIN B12: Vitamin B-12: 781 pg/mL (ref 211–911)

## 2021-07-26 LAB — VITAMIN D 25 HYDROXY (VIT D DEFICIENCY, FRACTURES): VITD: 46.42 ng/mL (ref 30.00–100.00)

## 2021-07-30 ENCOUNTER — Ambulatory Visit: Payer: Medicare HMO | Admitting: Family Medicine

## 2021-08-21 ENCOUNTER — Telehealth: Payer: Self-pay | Admitting: Family Medicine

## 2021-08-21 NOTE — Telephone Encounter (Signed)
LVM for pt to rtn my call to schedule AWV with [facility]. Please schedule this appt if pt calls the office.  °

## 2021-08-22 ENCOUNTER — Telehealth: Payer: Self-pay | Admitting: Family Medicine

## 2021-08-22 NOTE — Telephone Encounter (Signed)
LVM for pt to rtn my call to schedule AWV with [facility]. Please schedule AWV with [facility] if pt calls the office.

## 2021-09-06 NOTE — Progress Notes (Addendum)
Cardiology Office Note   Date:  09/07/2021   ID:  Laelyn, Blumenthal [birth year], MRN [id number]  PCP:  Tonia Ghent, MD  Cardiologist:   Minus Breeding, MD   Chief Complaint  Patient presents with   Dizziness      History of Present Illness: [name] is a 71 y.o. female who presents for evaluation of mild ventricular hypertrophy. She's also had palpitations. An echo in 2010 suggested moderate mitral regurgitation directed posteriorly. There was LVH. This was severe. She is also had some aortic insufficiency. However followup echocardiography in 2013 suggested only mild mitral regurgitation and no significant ventricular hypertrophy or aortic insufficiency.  The last echo in 2017 demonstrated mild LVH.  However, follow-up echo in 2021 did not suggest left-ventricular hypertrophy.  She had hypertrophy for years as mentioned above treated with verapamil presumably to control blood pressure.  She does not think her blood pressure was never really that high.  In 2021 she had presyncope and hypertensive urgency.   She wore a monitor that did demonstrate some SVT.  Interestingly she thinks her son has been diagnosed recently with some kind of cardiomyopathy but she is not clear on any of the details.  She herself feels okay.  She walks a mile and a half 3 times a week and does other exercises.  She does occasionally get dizzy spells which are described previously.  These are always when she is looking at the computer screen.  She loses her peripheral vision and feels like she might pass out.  They are few and far between and sporadic.  They do not happen when she is physically active.  She does not feel palpitations.  She has not had any syncope.  She denies any chest pressure, neck or arm discomfort.  She had no new shortness of breath, PND or orthopnea.  She has not had any hypertensive episodes like she had previously.   Past Medical History:  Diagnosis Date   Allergy    Anemia     age 42    Diverticulosis    GERD (gastroesophageal reflux disease)    occasional; OTC as needed   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   Hyperlipidemia    Hypertrophic cardiomyopathy (HCC)    Palpitations    Trigger finger of left hand 09/2012   left long finger    Past Surgical History:  Procedure Laterality Date   BREAST REDUCTION SURGERY Bilateral 2001   CHOLECYSTECTOMY  07/17/2011   Procedure: LAPAROSCOPIC CHOLECYSTECTOMY WITH INTRAOPERATIVE CHOLANGIOGRAM;  Surgeon: Haywood Lasso, MD;  Location: [place];  Service: General;  Laterality: N/A;   COLONOSCOPY     FOOT GANGLION EXCISION Right    [place] Right 1996   thumb   MYOMECTOMY  10/09/99   x 2   OOPHORECTOMY Right 1980   OOPHORECTOMY Left [place]   PARTIAL HYSTERECTOMY  2002   with no remaining ovary   POLYPECTOMY     TRIGGER FINGER RELEASE Left 09/28/2012   Procedure: RELEASE TRIGGER FINGER/A-1 PULLEY;  Surgeon: Jolyn Nap, MD;  Location: [place];  Service: Orthopedics;  Laterality: Left;   UPPER GASTROINTESTINAL ENDOSCOPY       Current Outpatient Medications  Medication Sig Dispense Refill   b complex vitamins tablet Take 1 tablet by mouth daily.     Black Currant Seed Oil 500  MG CAPS Take 1 capsule by mouth daily.     cholecalciferol (VITAMIN D3) 25 MCG (1000 UNIT) tablet Take 1,000 Units by mouth daily.     Coenzyme Q10 (CO Q 10 PO) Take by mouth.     pravastatin (PRAVACHOL) 20 MG tablet Take 1 tablet (20 mg total) by mouth daily. 90 tablet 3   verapamil (CALAN-SR) 240 MG CR tablet TAKE 1 TABLET EVERY DAY 90 tablet 3   fluticasone (FLONASE) 50 MCG/ACT nasal spray Place 2 sprays into both nostrils daily. (Patient not taking: Reported on 09/07/2021) 16 g 12   No current facility-administered medications for this visit.    Allergies:   Morphine sulfate and Penicillins    ROS:  Please see the history of present  illness.   Otherwise, review of systems are positive for none.   All other systems are reviewed and negative.    PHYSICAL EXAM: VS:  BP 126/72   Pulse (!) 58   Ht 5\' 5"  (1.651 m)   Wt 190 lb 12.8 oz (86.5 kg)   SpO2 97%   BMI 31.75 kg/m  , BMI Body mass index is 31.75 kg/m. GENERAL:  Well appearing NECK:  No jugular venous distention, waveform within normal limits, carotid upstroke brisk and symmetric, no bruits, no thyromegaly LUNGS:  Clear to auscultation bilaterally CHEST:  Unremarkable HEART:  PMI not displaced or sustained,S1 and S2 within normal limits, no S3, no S4, no clicks, no rubs, no murmurs ABD:  Flat, positive bowel sounds normal in frequency in pitch, no bruits, no rebound, no guarding, no midline pulsatile mass, no hepatomegaly, no splenomegaly EXT:  2 plus pulses throughout, no edema, no cyanosis no clubbing  EKG:  EKG  ordered today. The ekg ordered  demonstrates sinus rhythm, rate 58, axis within normal limits, nonspecific ST-T wave changes, sinus arrhythmia.   Recent Labs: 07/26/2021: ALT 31; BUN 12; Creatinine, Ser 0.66; Hemoglobin 12.9; Platelets 190.0; Potassium 4.6; Sodium 141; TSH 1.11    Lipid Panel    Component Value Date/Time   CHOL 216 (H) 07/26/2021 0726   TRIG 106.0 07/26/2021 0726   HDL 45.00 07/26/2021 0726   CHOLHDL 5 07/26/2021 0726   VLDL 21.2 07/26/2021 0726   LDLCALC 150 (H) 07/26/2021 0726      Wt Readings from Last 3 Encounters:  09/07/21 190 lb 12.8 oz (86.5 kg)  05/28/21 190 lb 6.4 oz (86.4 kg)  05/18/21 188 lb (85.3 kg)      Other studies Reviewed: Additional studies/ records that were reviewed today include: Labs. Review of the above records demonstrates:  Please see elsewhere in the note.     ASSESSMENT AND PLAN:  DIZZINESS:   She still has these episodes but she has had a work-up previously without a clear etiology on monitor although she had some SVT.  These are not changed and she has not had any frank syncope.  No  further work-up is suggested.   HTN:   Blood pressures is well controlled.  No change in therapy.    PALPITATIONS:   She has not having any further palpitations.  No change in therapy.   DYSLIPIDEMIA:     Her LDL 150.  She just started taking pravastatin.  She does eat only rarely chicken but mostly fish and vegetables.  She will have her lipids followed by Tonia Ghent, MD  AORTIC ATHEROSCLEROSIS:   We will continue with aggressive risk reduction.  LVH: I will check a follow-up echocardiogram particularly given her  son's recent diagnosis but I am not suspecting any infiltrative or congenital issues.  If again there is no LVH then no further work-up will be planned.  ABNORMAL EKG: She has T wave inversions as listed above but these are unchanged from previous.  No change in therapy.   Current medicines are reviewed at length with the patient today.  The patient does not have concerns regarding medicines.  The following changes have been made: None  Labs/ tests ordered today include:   Orders Placed This Encounter  Procedures   EKG 12-Lead   ECHOCARDIOGRAM COMPLETE     Disposition:   FU with me as needed and based on the results of the above.     Signed, Minus Breeding, MD  09/07/2021 5:08 PM    [facility]

## 2021-09-07 ENCOUNTER — Ambulatory Visit: Payer: Medicare HMO | Admitting: Cardiology

## 2021-09-07 ENCOUNTER — Encounter: Payer: Self-pay | Admitting: Cardiology

## 2021-09-07 ENCOUNTER — Other Ambulatory Visit: Payer: Self-pay

## 2021-09-07 VITALS — BP 126/72 | HR 58 | Ht 65.0 in | Wt 190.8 lb

## 2021-09-07 DIAGNOSIS — R42 Dizziness and giddiness: Secondary | ICD-10-CM

## 2021-09-07 DIAGNOSIS — R002 Palpitations: Secondary | ICD-10-CM

## 2021-09-07 DIAGNOSIS — I517 Cardiomegaly: Secondary | ICD-10-CM

## 2021-09-07 DIAGNOSIS — E785 Hyperlipidemia, unspecified: Secondary | ICD-10-CM

## 2021-09-07 DIAGNOSIS — I700 Atherosclerosis of aorta: Secondary | ICD-10-CM

## 2021-09-07 NOTE — Patient Instructions (Addendum)
Medication Instructions:  ?Your physician recommends that you continue on your current medications as directed. Please refer to the Current Medication list given to you today.  ?*If you need a refill on your cardiac medications before your next appointment, please call your pharmacy* ? ? ?Lab Work: ?None ?If you have labs (blood work) drawn today and your tests are completely normal, you will receive your results only by: ?MyChart Message (if you have MyChart) OR ?A paper copy in the mail ?If you have any lab test that is abnormal or we need to change your treatment, we will call you to review the results. ? ? ?Testing/Procedures: ?Your physician has requested that you have an echocardiogram. Echocardiography is a painless test that uses sound waves to create images of your heart. It provides your doctor with information about the size and shape of your heart and how well your heart?s chambers and valves are working. This procedure takes approximately one hour. There are no restrictions for this procedure. ? ? ? ?Follow-Up: ?At [facility], you and your health needs are our priority.  As part of our continuing mission to provide you with exceptional heart care, we have created designated Provider Care Teams.  These Care Teams include your primary Cardiologist (physician) and Advanced Practice Providers (APPs -  Physician Assistants and Nurse Practitioners) who all work together to provide you with the care you need, when you need it. ? ?We recommend signing up for the patient portal called "MyChart".  Sign up information is provided on this After Visit Summary.  MyChart is used to connect with patients for Virtual Visits (Telemedicine).  Patients are able to view lab/test results, encounter notes, upcoming appointments, etc.  Non-urgent messages can be sent to your provider as well.   ?To learn more about what you can do with MyChart, go to [email or web address].   ? ?Your next appointment:   ?As needed ? ?The  format for your next appointment:   ?In Person ? ?Provider:   ?Minus Breeding, MD   ? ? ?Other Instructions ?  ?

## 2021-09-20 ENCOUNTER — Ambulatory Visit (HOSPITAL_COMMUNITY): Payer: Medicare HMO | Attending: Cardiology

## 2021-09-20 ENCOUNTER — Encounter (HOSPITAL_COMMUNITY): Payer: Self-pay | Admitting: Cardiology

## 2021-09-20 ENCOUNTER — Encounter (HOSPITAL_COMMUNITY): Payer: Self-pay

## 2021-10-10 ENCOUNTER — Ambulatory Visit: Payer: Medicare HMO

## 2021-11-05 ENCOUNTER — Telehealth: Payer: Self-pay

## 2021-11-05 DIAGNOSIS — R21 Rash and other nonspecific skin eruption: Secondary | ICD-10-CM

## 2021-11-05 NOTE — Addendum Note (Signed)
Addended by: Tonia Ghent on: 11/05/2021 01:54 PM ? ? Modules accepted: Orders ? ?

## 2021-11-05 NOTE — Telephone Encounter (Signed)
Patient called requesting referral to GYN. Has rash in vaginal area that started last week. Patient wold like to go to [facility]. Ok to do referral.  ?

## 2021-11-05 NOTE — Telephone Encounter (Signed)
Spoke with patient and advised referral was done and that we may can see her sooner if she wanted. Patient was only comfortable with a females physician for this. I scheduled an appt for her with Dr. Glori Bickers on 11/07/21 at 3:00 pm.  ?

## 2021-11-05 NOTE — Telephone Encounter (Addendum)
I put in the referral.  We may be able to see patient here sooner than she can get an appointment at the gyn clinic.  Thanks.  ?

## 2021-11-07 ENCOUNTER — Ambulatory Visit (INDEPENDENT_AMBULATORY_CARE_PROVIDER_SITE_OTHER): Payer: Medicare HMO | Admitting: Family Medicine

## 2021-11-07 ENCOUNTER — Encounter: Payer: Self-pay | Admitting: Family Medicine

## 2021-11-07 VITALS — BP 142/78 | HR 85 | Temp 97.6°F | Ht 65.0 in | Wt 195.1 lb

## 2021-11-07 DIAGNOSIS — N952 Postmenopausal atrophic vaginitis: Secondary | ICD-10-CM | POA: Insufficient documentation

## 2021-11-07 DIAGNOSIS — R102 Pelvic and perineal pain: Secondary | ICD-10-CM | POA: Diagnosis not present

## 2021-11-07 DIAGNOSIS — N898 Other specified noninflammatory disorders of vagina: Secondary | ICD-10-CM | POA: Diagnosis not present

## 2021-11-07 LAB — POCT WET PREP (WET MOUNT): Trichomonas Wet Prep HPF POC: ABSENT

## 2021-11-07 MED ORDER — ESTRADIOL 0.1 MG/GM VA CREA: TOPICAL_CREAM | VAGINAL | 0 refills | Status: DC

## 2021-11-07 NOTE — Assessment & Plan Note (Signed)
Few clue cells on wet prep ?Likely BV but pt choose not to treat, not bothered  ? ?Not sexually active  ?Enc probiotics ?inst to alert [place] if worse or if she wants treatment  ?Also disc trial of probiotics  ?

## 2021-11-07 NOTE — Assessment & Plan Note (Signed)
Suspect due to atrophy ?

## 2021-11-07 NOTE — Patient Instructions (Signed)
I think you have a vaginal tear/split from atrophy (dryness)  ?Try the estrace cream twice weekly  ?This may prevent it from happening as frequently  ? ? ?A barrier cream for the injured area like desitin or A and D is helpful to protect until healed  ? ?Avoid detergents / water alone is your best bet  ? ?If you want to treat the bacterial vaginosis let us know  ?

## 2021-11-07 NOTE — Progress Notes (Signed)
? ?Subjective:  ? ? Patient ID: [name], female    DOB: 02-10-1951, 71 y.o.   MRN: [id number] ? ?HPI ?72 yo pt of Dr Damita Dunnings presents with vaginal complaint  ? ?Wt Readings from Last 3 Encounters:  ?11/07/21 195 lb 2 oz (88.5 kg)  ?09/07/21 190 lb 12.8 oz (86.5 kg)  ?05/28/21 190 lb 6.4 oz (86.4 kg)  ? ?32.47 kg/m? ? ?Ever so often for 3-5 years gets a slit in the labia - on the R side  ?In spot between minora and majora  ?Like a tear  ?She uses camphor salve-usually works but not this time  ? ?No redness and no pale areas  ? ?Hurts to wipe ?Hurts with urine touches it  ? ?No yeast infection in years  ? ?No discharge or itching  ?Has baseline odor   ?Does wear panty liners for odor  ?This is not new  ? ?Once had a funny pain on pubis (brief and went away)  ? ?Has vaginal dryness  ?Not sexually active for 10 years  ? ?Partial hysterectomy in the past  ? ?Wet prep ? ?Results for orders placed or performed in visit on 11/07/21  ?POCT Wet Prep [facility])  ?Result Value Ref Range  ? Source Wet Prep POC vaginal   ? WBC, Wet Prep HPF POC few   ? Bacteria Wet Prep HPF POC Moderate (A) Few  ? BACTERIA WET PREP MORPHOLOGY POC    ? Clue Cells Wet Prep HPF POC Few (A) None  ? Clue Cells Wet Prep [place] POC    ? Yeast Wet Prep HPF POC None None  ? KOH Wet Prep POC None None  ? Trichomonas Wet Prep HPF POC Absent Absent  ?  ?Patient Active Problem List  ? Diagnosis Date Noted  ? Vaginal pain 11/07/2021  ? Vaginal odor 11/07/2021  ? Vaginal atrophy 11/07/2021  ? Occipital neuralgia 02/18/2021  ? Papule 12/31/2020  ? Plantar fasciitis 10/11/2020  ? Dysphagia 10/11/2020  ? Abdominal symptoms 05/07/2020  ? Rhinorrhea 05/07/2020  ? TIA (transient ischemic attack) 09/29/2019  ? Seasonal allergies 04/14/2019  ? Healthcare maintenance 11/04/2018  ? Abdominal pain, LLQ 11/27/2017  ? Aortic atherosclerosis ([place]) 09/30/2017  ? Advance care planning 06/07/2015  ? Muscle strain 12/09/2014  ? Paresthesia 03/16/2014  ? Achilles tendonitis  09/15/2013  ? Balance problem 03/19/2013  ? Cough 08/25/2012  ? Rash 07/10/2012  ? History of hepatitis C 05/29/2011  ? Mitral and aortic regurgitation 01/24/2011  ? Medicare annual wellness visit, subsequent 01/12/2011  ? LVH (left ventricular hypertrophy) 05/03/2009  ? DIVERTICULITIS OF COLON 03/02/2009  ? External hemorrhoids 10/24/2008  ? HYPERCHOLESTEROLEMIA 10/07/2006  ? Essential hypertension 12/06/2005  ? Hyperglycemia 12/06/2005  ? ?Past Medical History:  ?Diagnosis Date  ? Allergy   ? Anemia   ? age 68   ? Diverticulosis   ? GERD (gastroesophageal reflux disease)   ? occasional; OTC as needed  ? HCV (hepatitis C virus)   ? treated and cured 2016  ? Hyperlipidemia   ? Hypertrophic cardiomyopathy ([place])   ? Palpitations   ? Trigger finger of left hand 09/2012  ? left long finger  ? ?Past Surgical History:  ?Procedure Laterality Date  ? BREAST REDUCTION SURGERY Bilateral 2001  ? CHOLECYSTECTOMY  07/17/2011  ? Procedure: LAPAROSCOPIC CHOLECYSTECTOMY WITH INTRAOPERATIVE CHOLANGIOGRAM;  Surgeon: Haywood Lasso, MD;  Location: [place];  Service: General;  Laterality: N/A;  ? COLONOSCOPY    ? FOOT GANGLION  EXCISION Right   ? [place]  ? LACERATION REPAIR Right 1996  ? thumb  ? MYOMECTOMY  10/09/99  ? x 2  ? OOPHORECTOMY Right 1980  ? OOPHORECTOMY Left 2000  ? OVARIAN CYST DRAINAGE  1980  ? PARTIAL HYSTERECTOMY  2002  ? with no remaining ovary  ? POLYPECTOMY    ? TRIGGER FINGER RELEASE Left 09/28/2012  ? Procedure: RELEASE TRIGGER FINGER/A-1 PULLEY;  Surgeon: Jolyn Nap, MD;  Location: [place];  Service: Orthopedics;  Laterality: Left;  ? UPPER GASTROINTESTINAL ENDOSCOPY    ? ?Social History  ? ?Tobacco Use  ? Smoking status: Former  ?  Packs/day: 2.50  ?  Years: 42.00  ?  Pack years: 105.00  ?  Types: Cigarettes  ?  Quit date: 07/07/2005  ?  Years since quitting: 16.3  ? Smokeless tobacco: Never  ? Tobacco comments:  ?  quit smoking 2008  ?Vaping Use  ? Vaping Use: Never used   ?Substance Use Topics  ? Alcohol use: Not Currently  ?  Comment: a few glasses a wine a week  ? Drug use: No  ?  Comment: previous hx. of  ? ?Family History  ?Problem Relation Age of Onset  ? Colon cancer Father 73  ? Hepatitis C Brother   ? Diabetes Sister   ? Hypertension Brother   ? Hepatitis C Brother   ? Colon cancer Paternal Aunt   ?     dx'd in her 19's   ? Breast cancer Paternal Aunt   ? Lung cancer Paternal Uncle   ? Breast cancer Cousin   ? Liver cancer Cousin   ? Colon cancer Other   ?     cousin   ? Colon polyps Neg Hx   ? Esophageal cancer Neg Hx   ? Rectal cancer Neg Hx   ? Stomach cancer Neg Hx   ? ?Allergies  ?Allergen Reactions  ? Morphine Sulfate Swelling  ? Penicillins Swelling  ? ?Current Outpatient Medications on File Prior to Visit  ?Medication Sig Dispense Refill  ? b complex vitamins tablet Take 1 tablet by mouth daily.    ? Black Currant Seed Oil 500 MG CAPS Take 1 capsule by mouth daily.    ? cholecalciferol (VITAMIN D3) 25 MCG (1000 UNIT) tablet Take 1,000 Units by mouth daily.    ? Coenzyme Q10 (CO Q 10 PO) Take by mouth.    ? fluticasone (FLONASE) 50 MCG/ACT nasal spray Place 2 sprays into both nostrils daily. 16 g 12  ? pravastatin (PRAVACHOL) 20 MG tablet Take 1 tablet (20 mg total) by mouth daily. 90 tablet 3  ? verapamil (CALAN-SR) 240 MG CR tablet TAKE 1 TABLET EVERY DAY 90 tablet 3  ? ?No current facility-administered medications on file prior to visit.  ?  ?Review of Systems  ?Constitutional:  Negative for activity change, appetite change, fatigue, fever and unexpected weight change.  ?HENT:  Negative for congestion, ear pain, rhinorrhea, sinus pressure and sore throat.   ?Eyes:  Negative for pain, redness and visual disturbance.  ?Respiratory:  Negative for cough, shortness of breath and wheezing.   ?Cardiovascular:  Negative for chest pain and palpitations.  ?Gastrointestinal:  Negative for abdominal pain, blood in stool, constipation and diarrhea.  ?Endocrine: Negative for  polydipsia and polyuria.  ?Genitourinary:  Positive for vaginal pain. Negative for decreased urine volume, dysuria, frequency, urgency, vaginal bleeding and vaginal discharge.  ?Musculoskeletal:  Negative for arthralgias, back  pain and myalgias.  ?Skin:  Negative for pallor and rash.  ?Allergic/Immunologic: Negative for environmental allergies.  ?Neurological:  Negative for dizziness, syncope and headaches.  ?Hematological:  Negative for adenopathy. Does not bruise/bleed easily.  ?Psychiatric/Behavioral:  Negative for decreased concentration and dysphoric mood. The patient is not nervous/anxious.   ? ?   ?Objective:  ? Physical Exam ?Exam conducted with a chaperone present.  ?Constitutional:   ?   General: She is not in acute distress. ?   Appearance: Normal appearance. She is obese. She is not ill-appearing or diaphoretic.  ?Eyes:  ?   Conjunctiva/sclera: Conjunctivae normal.  ?   Pupils: Pupils are equal, round, and reactive to light.  ?Cardiovascular:  ?   Rate and Rhythm: Normal rate and regular rhythm.  ?   Heart sounds: Normal heart sounds.  ?Pulmonary:  ?   Effort: Pulmonary effort is normal. No respiratory distress.  ?Abdominal:  ?   General: There is no distension.  ?   Tenderness: There is no abdominal tenderness.  ?Genitourinary: ?   Pubic Area: No rash.   ?   Labia:     ?   Right: Tenderness and injury present. No rash.     ?   Left: No rash.   ?   Urethra: No prolapse or urethral swelling.  ?   Vagina: No vaginal discharge, erythema, tenderness or bleeding.  ?   Comments: Small skin split between labia minora and majora ?Pink/healing tissue ?Dry vaginal mucosa  ? ?No vaginal discharge  ? ?Wet prep shows few clue cells  ?Lymphadenopathy:  ?   Cervical: No cervical adenopathy.  ?Neurological:  ?   Mental Status: She is alert.  ? ? ? ? ? ?   ?Assessment & Plan:  ? ?Problem List Items Addressed This Visit   ? ?  ? Genitourinary  ? Vaginal atrophy - Primary  ?  Causing split in tissue between labia minora  and majora on the R side  ?Disc tx options -will try estrace cream/ discusses poss side effects topical twice weekly  ?Try not to traumatize ?Barrier cream until healed  ?No signs of lichen sclerosis noted ?

## 2021-11-07 NOTE — Assessment & Plan Note (Signed)
Causing split in tissue between labia minora and majora on the R side  ?Disc tx options -will try estrace cream/ discusses poss side effects topical twice weekly  ?Try not to traumatize ?Barrier cream until healed  ?No signs of lichen sclerosis noted ?Update if not starting to improve in a week or if worsening   ?

## 2021-11-27 ENCOUNTER — Telehealth: Payer: Self-pay | Admitting: Family Medicine

## 2021-11-27 NOTE — Telephone Encounter (Signed)
Left message for patient to call back and schedule Medicare Annual Wellness Visit (AWV) either virtually or phone   Last AWV 02/15/20 ; please schedule at anytime with health coach    I left my direct # [phone]

## 2021-11-28 ENCOUNTER — Encounter: Payer: Self-pay | Admitting: Family Medicine

## 2021-12-06 ENCOUNTER — Ambulatory Visit: Payer: Medicare HMO | Admitting: Family Medicine

## 2022-01-31 ENCOUNTER — Ambulatory Visit (INDEPENDENT_AMBULATORY_CARE_PROVIDER_SITE_OTHER): Payer: Medicare HMO

## 2022-01-31 VITALS — Ht 65.0 in | Wt 190.0 lb

## 2022-01-31 DIAGNOSIS — Z0000 Encounter for general adult medical examination without abnormal findings: Secondary | ICD-10-CM | POA: Diagnosis not present

## 2022-01-31 NOTE — Patient Instructions (Signed)
[name] , Thank you for taking time to come for your Medicare Wellness Visit. I appreciate your ongoing commitment to your health goals. Please review the following plan we discussed and let me know if I can assist you in the future.   Screening recommendations/referrals: Colonoscopy: completed 08/24/2020, due 08/24/2025 Mammogram: completed 05/18/2021, due 05/19/2022 Bone Density: decline Recommended yearly ophthalmology/optometry visit for glaucoma screening and checkup Recommended yearly dental visit for hygiene and checkup  Vaccinations: Influenza vaccine: due 02/05/2022 Pneumococcal vaccine: completed 06/06/2015 Tdap vaccine: completed 09/06/2014, due 09/05/2024 Shingles vaccine: discussed   Covid-19: 10/31/2020, 05/02/2020, 08/25/2019, 07/30/2019  Advanced directives: Advance directive discussed with you today.   Conditions/risks identified: none  Next appointment: Follow up in one year for your annual wellness visit    Preventive Care 73 Years and Older, Female Preventive care refers to lifestyle choices and visits with your health care provider that can promote health and wellness. What does preventive care include? A yearly physical exam. This is also called an annual well check. Dental exams once or twice a year. Routine eye exams. Ask your health care provider how often you should have your eyes checked. Personal lifestyle choices, including: Daily care of your teeth and gums. Regular physical activity. Eating a healthy diet. Avoiding tobacco and drug use. Limiting alcohol use. Practicing safe sex. Taking low-dose aspirin every day. Taking vitamin and mineral supplements as recommended by your health care provider. What happens during an annual well check? The services and screenings done by your health care provider during your annual well check will depend on your age, overall health, lifestyle risk factors, and family history of disease. Counseling  Your health care  provider may ask you questions about your: Alcohol use. Tobacco use. Drug use. Emotional well-being. Home and relationship well-being. Sexual activity. Eating habits. History of falls. Memory and ability to understand (cognition). Work and work Statistician. Reproductive health. Screening  You may have the following tests or measurements: Height, weight, and BMI. Blood pressure. Lipid and cholesterol levels. These may be checked every 5 years, or more frequently if you are over 45 years old. Skin check. Lung cancer screening. You may have this screening every year starting at age 73 if you have a 30-pack-year history of smoking and currently smoke or have quit within the past 15 years. Fecal occult blood test (FOBT) of the stool. You may have this test every year starting at age 73. Flexible sigmoidoscopy or colonoscopy. You may have a sigmoidoscopy every 5 years or a colonoscopy every 10 years starting at age 73. Hepatitis C blood test. Hepatitis B blood test. Sexually transmitted disease (STD) testing. Diabetes screening. This is done by checking your blood sugar (glucose) after you have not eaten for a while (fasting). You may have this done every 1-3 years. Bone density scan. This is done to screen for osteoporosis. You may have this done starting at age 73. Mammogram. This may be done every 1-2 years. Talk to your health care provider about how often you should have regular mammograms. Talk with your health care provider about your test results, treatment options, and if necessary, the need for more tests. Vaccines  Your health care provider may recommend certain vaccines, such as: Influenza vaccine. This is recommended every year. Tetanus, diphtheria, and acellular pertussis (Tdap, Td) vaccine. You may need a Td booster every 10 years. Zoster vaccine. You may need this after age 73. Pneumococcal 13-valent conjugate (PCV13) vaccine. One dose is recommended after age  73. Pneumococcal  polysaccharide (PPSV23) vaccine. One dose is recommended after age 73. Talk to your health care provider about which screenings and vaccines you need and how often you need them. This information is not intended to replace advice given to you by your health care provider. Make sure you discuss any questions you have with your health care provider. Document Released: 07/21/2015 Document Revised: 03/13/2016 Document Reviewed: 04/25/2015 Elsevier Interactive Patient Education  2017 [place] Prevention in the Home Falls can cause injuries. They can happen to people of all ages. There are many things you can do to make your home safe and to help prevent falls. What can I do on the outside of my home? Regularly fix the edges of walkways and driveways and fix any cracks. Remove anything that might make you trip as you walk through a door, such as a raised step or threshold. Trim any bushes or trees on the path to your home. Use bright outdoor lighting. Clear any walking paths of anything that might make someone trip, such as rocks or tools. Regularly check to see if handrails are loose or broken. Make sure that both sides of any steps have handrails. Any raised decks and porches should have guardrails on the edges. Have any leaves, snow, or ice cleared regularly. Use sand or salt on walking paths during winter. Clean up any spills in your garage right away. This includes oil or grease spills. What can I do in the bathroom? Use night lights. Install grab bars by the toilet and in the tub and shower. Do not use towel bars as grab bars. Use non-skid mats or decals in the tub or shower. If you need to sit down in the shower, use a plastic, non-slip stool. Keep the floor dry. Clean up any water that spills on the floor as soon as it happens. Remove soap buildup in the tub or shower regularly. Attach bath mats securely with double-sided non-slip rug tape. Do not have throw  rugs and other things on the floor that can make you trip. What can I do in the bedroom? Use night lights. Make sure that you have a light by your bed that is easy to reach. Do not use any sheets or blankets that are too big for your bed. They should not hang down onto the floor. Have a firm chair that has side arms. You can use this for support while you get dressed. Do not have throw rugs and other things on the floor that can make you trip. What can I do in the kitchen? Clean up any spills right away. Avoid walking on wet floors. Keep items that you use a lot in easy-to-reach places. If you need to reach something above you, use a strong step stool that has a grab bar. Keep electrical cords out of the way. Do not use floor polish or wax that makes floors slippery. If you must use wax, use non-skid floor wax. Do not have throw rugs and other things on the floor that can make you trip. What can I do with my stairs? Do not leave any items on the stairs. Make sure that there are handrails on both sides of the stairs and use them. Fix handrails that are broken or loose. Make sure that handrails are as long as the stairways. Check any carpeting to make sure that it is firmly attached to the stairs. Fix any carpet that is loose or worn. Avoid having throw rugs at the top or  bottom of the stairs. If you do have throw rugs, attach them to the floor with carpet tape. Make sure that you have a light switch at the top of the stairs and the bottom of the stairs. If you do not have them, ask someone to add them for you. What else can I do to help prevent falls? Wear shoes that: Do not have high heels. Have rubber bottoms. Are comfortable and fit you well. Are closed at the toe. Do not wear sandals. If you use a stepladder: Make sure that it is fully opened. Do not climb a closed stepladder. Make sure that both sides of the stepladder are locked into place. Ask someone to hold it for you, if  possible. Clearly mark and make sure that you can see: Any grab bars or handrails. First and last steps. Where the edge of each step is. Use tools that help you move around (mobility aids) if they are needed. These include: Canes. Walkers. Scooters. Crutches. Turn on the lights when you go into a dark area. Replace any light bulbs as soon as they burn out. Set up your furniture so you have a clear path. Avoid moving your furniture around. If any of your floors are uneven, fix them. If there are any pets around you, be aware of where they are. Review your medicines with your doctor. Some medicines can make you feel dizzy. This can increase your chance of falling. Ask your doctor what other things that you can do to help prevent falls. This information is not intended to replace advice given to you by your health care provider. Make sure you discuss any questions you have with your health care provider. Document Released: 04/20/2009 Document Revised: 11/30/2015 Document Reviewed: 07/29/2014 Elsevier Interactive Patient Education  2017 [employer].

## 2022-01-31 NOTE — Progress Notes (Signed)
I connected with Pricilla Larsson today by telephone and verified that I am speaking with the correct person using two identifiers. Location patient: home Location provider: work Persons participating in the virtual visit: Itxel, Wickard LPN.   I discussed the limitations, risks, security and privacy concerns of performing an evaluation and management service by telephone and the availability of in person appointments. I also discussed with the patient that there may be a patient responsible charge related to this service. The patient expressed understanding and verbally consented to this telephonic visit.    Interactive audio and video telecommunications were attempted between this provider and patient, however failed, due to patient having technical difficulties OR patient did not have access to video capability.  We continued and completed visit with audio only.     Vital signs may be patient reported or missing.  Subjective:   [name] is a 71 y.o. female who presents for Medicare Annual (Subsequent) preventive examination.  Review of Systems     Cardiac Risk Factors include: advanced age (>33mn, >>55women);hypertension;obesity (BMI >30kg/m2)     Objective:    Today's Vitals   01/31/22 1611  Weight: 190 lb (86.2 kg)  Height: '5\' 5"'$  (1.651 m)   Body mass index is 31.62 kg/m.     01/31/2022    4:14 PM 02/04/2021    9:24 AM 02/15/2020    3:43 PM 02/15/2020    8:58 AM 04/04/2019    3:44 PM 06/09/2018   12:18 PM 06/30/2016   12:03 PM  Advanced Directives  Does Patient Have a Medical Advance Directive? No No No No No No No  Would patient like information on creating a medical advance directive?  No - Patient declined No - Patient declined No - Patient declined  No - Patient declined No - Patient declined    Current Medications (verified) Outpatient Encounter Medications as of 01/31/2022  Medication Sig   b complex vitamins tablet Take 1 tablet by mouth  daily.   Black Currant Seed Oil 500 MG CAPS Take 1 capsule by mouth daily.   cholecalciferol (VITAMIN D3) 25 MCG (1000 UNIT) tablet Take 1,000 Units by mouth daily.   Coenzyme Q10 (CO Q 10 PO) Take by mouth.   estradiol (ESTRACE VAGINAL) 0.1 MG/GM vaginal cream Apply to affected vaginal area twice weekly (pea sized amount)   fluticasone (FLONASE) 50 MCG/ACT nasal spray Place 2 sprays into both nostrils daily.   pravastatin (PRAVACHOL) 20 MG tablet Take 1 tablet (20 mg total) by mouth daily.   verapamil (CALAN-SR) 240 MG CR tablet TAKE 1 TABLET EVERY DAY   No facility-administered encounter medications on file as of 01/31/2022.    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   Hyperlipidemia    Hypertrophic cardiomyopathy (HCC)    Palpitations    Trigger finger of left hand 09/2012   left long finger   Past Surgical History:  Procedure Laterality Date   BREAST REDUCTION SURGERY Bilateral 2001   CHOLECYSTECTOMY  07/17/2011   Procedure: LAPAROSCOPIC CHOLECYSTECTOMY WITH INTRAOPERATIVE CHOLANGIOGRAM;  Surgeon: CHaywood Lasso MD;  [facility]  Service: General;  Laterality: N/A;   COLONOSCOPY     FOOT  GANGLION EXCISION Right    HEMORRHOID SURGERY  1980   LACERATION REPAIR Right 1996   thumb   MYOMECTOMY  10/09/99   x 2   OOPHORECTOMY Right 1980   OOPHORECTOMY Left 2000   OVARIAN CYST DRAINAGE  1980   PARTIAL HYSTERECTOMY  2002   with no remaining ovary   POLYPECTOMY     TRIGGER FINGER RELEASE Left 09/28/2012   Procedure: RELEASE TRIGGER FINGER/A-1 PULLEY;  Surgeon: Jolyn Nap, MD;  Location: [facility];  Service: Orthopedics;  Laterality: Left;   UPPER GASTROINTESTINAL ENDOSCOPY     Family History  Problem Relation Age  of Onset   Colon cancer Father 50   Hepatitis C Brother    Diabetes Sister    Hypertension Brother    Hepatitis C Brother    Colon cancer Paternal Aunt        dx'd in her 82's    Breast cancer Paternal 46    Lung cancer Paternal Uncle    Breast cancer Cousin    Liver cancer Cousin    Colon cancer Other        cousin    Colon polyps Neg Hx    Esophageal cancer Neg Hx    Rectal cancer Neg Hx    Stomach cancer Neg Hx    Social History   Socioeconomic History   Marital status: Single    Spouse name: Not on file   Number of children: 2   Years of education: Not on file   Highest education level: Not on file  Occupational History   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 of   Sexual activity: Yes    Birth control/protection: Surgical  Other Topics Concern   Not on file  Social History Narrative   Divorced 1982   2 children   Left handed   Working from home for [employer] as of 2019- laid off 2021   Social Determinants of Health   Financial Resource Strain: Low Risk  (01/31/2022)   Overall Financial Resource Strain (CARDIA)    Difficulty of Paying Living Expenses: Not hard at all  Food Insecurity: No Food Insecurity (01/31/2022)   Hunger Vital Sign    Worried About Running Out of Food in the Last Year: Never true    Ran Out of Food in the Last Year: Never true  Transportation Needs: No Transportation Needs (01/31/2022)   PRAPARE - Hydrologist (Medical): No    Lack of Transportation (Non-Medical): No  Physical Activity:  Sufficiently Active (01/31/2022)   Exercise Vital Sign    Days of Exercise per Week: 5 days    Minutes of Exercise per Session: 40 min  Stress: No Stress Concern Present (01/31/2022)   [place]    Feeling of Stress : Not at all  Social Connections: Not on file  Tobacco Counseling Counseling given: Not Answered Tobacco comments: quit smoking 2008   Clinical Intake:  Pre-visit preparation completed: Yes  Pain : No/denies pain     Nutritional Status: BMI > 30  Obese Nutritional Risks: None Diabetes: No  How often do you need to have someone help you when you read instructions, pamphlets, or other written materials from your doctor or pharmacy?: 1 - Never  Diabetic? no  Interpreter Needed?: No  Information entered by :: NAllen LPN   Activities of Daily Living    01/31/2022    4:15 PM 02/15/2021    8:09 AM  In your present state of health, do you have any difficulty performing the following activities:  Hearing? 0 0  Vision? 0 0  Difficulty concentrating or making decisions? 0 0  Walking or climbing stairs? 0 0  Dressing or bathing? 0 0  Doing errands, shopping? 0 0  Preparing Food and eating ? N   Using the Toilet? N   In the past six months, have you accidently leaked urine? N   Do you have problems with loss of bowel control? N   Managing your Medications? N   Managing your Finances? N   Housekeeping or managing your Housekeeping? N     Patient Care Team: Tonia Ghent, MD as PCP - General (Family Medicine) Minus Breeding, MD as PCP - Cardiology (Cardiology) Lafayette Dragon, MD (Inactive) as Consulting Physician (Gastroenterology) Marty Heck, MD as Consulting Physician (Gastroenterology) Dannielle Karvonen, RN as [place] any recent [place] you may have received from other than [facility] providers in the past year (date may be  approximate).     Assessment:   This is a routine wellness examination for Krisinda.  Hearing/Vision screen Vision Screening - Comments:: Regular eye exams, [facility]  Dietary issues and exercise activities discussed: Current Exercise Habits: Home exercise routine, Type of exercise: strength training/weights;treadmill, Time (Minutes): 40, Frequency (Times/Week): 5, Weekly Exercise (Minutes/Week): 200   Goals Addressed             This Visit's Progress    Patient Stated       01/31/2022, wants to lose 20 pounds       Depression Screen    01/31/2022    4:14 PM 07/11/2020   11:23 AM 02/15/2020    8:59 AM 07/31/2017    9:52 AM 12/11/2016    6:31 PM 06/13/2016   10:15 AM 06/06/2015   10:11 AM  PHQ 2/9 Scores  PHQ - 2 Score 0 0 0 0 0 0 0  PHQ- 9 Score   0        Fall Risk    01/31/2022    4:14 PM 01/09/2021   12:32 PM 07/11/2020   11:23 AM 02/15/2020    8:59 AM 07/31/2017    9:52 AM  [place] in the past year? 0 0 0 0 Yes  Number falls in past yr: 0 0 0 0 1  Injury with Fall? 0 0 0 0 No  Risk for fall due to : Medication side effect Post anesthesia  Medication side effect   Follow up Falls evaluation completed;Education provided;Falls prevention discussed Falls evaluation completed Falls evaluation completed Falls evaluation completed;Falls prevention discussed     FALL RISK PREVENTION PERTAINING TO THE HOME:  Any stairs in or around the home? Yes  If so, are there any without handrails? No  Home free of loose throw rugs  in walkways, pet beds, electrical cords, etc? Yes  Adequate lighting in your home to reduce risk of falls? Yes   ASSISTIVE DEVICES UTILIZED TO PREVENT FALLS:  Life alert? No  Use of a cane, walker or w/c? No  Grab bars in the bathroom? Yes  Shower chair or bench in shower? No  Elevated toilet seat or a handicapped toilet? Yes   TIMED UP AND GO:  Was the test performed? No .      Cognitive Function:    02/15/2020    9:01 AM  MMSE -  Mini Mental State Exam  Orientation to time 5  Orientation to Place 5  Registration 3  Attention/ Calculation 5  Recall 3  Language- repeat 1        01/31/2022    4:16 PM  6CIT Screen  What Year? 0 points  What month? 0 points  What time? 0 points  Count back from 20 0 points  Months in reverse 0 points  Repeat phrase 0 points  Total Score 0 points    Immunizations Immunization History  Administered Date(s) Administered   Fluad Quad(high Dose 65+) 05/05/2020, 05/28/2021   Hep A / Hep B 06/20/2011, 07/25/2011   Hepatitis B 12/27/2011   Influenza Split 06/10/2012   Influenza Whole 06/07/2006, 04/21/2008   Influenza,inj,Quad PF,6+ Mos 03/18/2013, 03/15/2014, 05/09/2015, 07/31/2017, 05/13/2018, 04/01/2019   Moderna Sars-Covid-2 Vaccination 07/30/2019, 08/25/2019, 05/02/2020, 10/31/2020   Pneumococcal Conjugate-13 06/06/2015   Pneumococcal Polysaccharide-23 04/27/2013   Td 06/11/2005   Tdap 09/06/2014   Zoster, Live 04/27/2013    TDAP status: Up to date  Flu Vaccine status: Up to date  Pneumococcal vaccine status: Up to date  Covid-19 vaccine status: Completed vaccines  Qualifies for Shingles Vaccine? Yes   Zostavax completed Yes   Shingrix Completed?: No.    Education has been provided regarding the importance of this vaccine. Patient has been advised to call insurance company to determine out of pocket expense if they have not yet received this vaccine. Advised may also receive vaccine at local pharmacy or Health Dept. Verbalized acceptance and understanding.  Screening Tests Health Maintenance  Topic Date Due   Zoster Vaccines- Shingrix (1 of 2) Never done   Pneumonia Vaccine 55+ Years old (3 - PPSV23 or PCV20) 04/27/2018   COVID-19 Vaccine (5 - Moderna series) 12/26/2020   DEXA SCAN  02/15/2024 (Originally 03/15/2014)   INFLUENZA VACCINE  02/05/2022   MAMMOGRAM  05/19/2023   TETANUS/TDAP  09/05/2024   COLONOSCOPY (Pts 45-20yr Insurance coverage will need to  be confirmed)  08/24/2025   Hepatitis C Screening  Completed   HPV VACCINES  Aged Out    Health Maintenance  Health Maintenance Due  Topic Date Due   Zoster Vaccines- Shingrix (1 of 2) Never done   Pneumonia Vaccine 73 Years old (3 - PPSV23 or PCV20) 04/27/2018   COVID-19 Vaccine (5 - Moderna series) 12/26/2020    Colorectal cancer screening: Type of screening: Colonoscopy. Completed 08/24/2020. Repeat every 5 years  Mammogram status: Completed 05/18/2021. Repeat every year  Bone Density status: decline  Lung Cancer Screening: (Low Dose CT Chest recommended if Age 73-80years, 30 pack-year currently smoking OR have quit w/in 15years.) does not qualify.   Lung Cancer Screening Referral: no  Additional Screening:  Hepatitis C Screening: does qualify; Completed 07/31/2017  Vision Screening: Recommended annual ophthalmology exams for early detection of glaucoma and other disorders of the eye. Is the patient up to date with their annual eye exam?  Yes  Who is the provider or what is the name of the office in which the patient attends annual eye exams? [facility] If pt is not established with a provider, would they like to be referred to a provider to establish care? No .   Dental Screening: Recommended annual dental exams for proper oral hygiene  Community Resource Referral / Chronic Care Management: CRR required this visit?  No   CCM required this visit?  No      Plan:     I have personally reviewed and noted the following in the patient's chart:   Medical and social history Use of alcohol, tobacco or illicit drugs  Current medications and supplements including opioid prescriptions.  Functional ability and status Nutritional status Physical activity Advanced directives List of other physicians Hospitalizations, surgeries, and ER visits in previous 12 months Vitals Screenings to include cognitive, depression, and falls Referrals and appointments  In addition, I  have reviewed and discussed with patient certain preventive protocols, quality metrics, and best practice recommendations. A written personalized care plan for preventive services as well as general preventive health recommendations were provided to patient.     Kellie Simmering, LPN   [id number]   Nurse Notes: none  Due to this being a virtual visit, the after visit summary with patients personalized plan was offered to patient via mail or my-chart. Patient would like to access on my-chart

## 2022-02-03 ENCOUNTER — Other Ambulatory Visit: Payer: Self-pay | Admitting: Family Medicine

## 2022-02-25 ENCOUNTER — Encounter: Payer: Self-pay | Admitting: Family Medicine

## 2022-02-25 ENCOUNTER — Ambulatory Visit (INDEPENDENT_AMBULATORY_CARE_PROVIDER_SITE_OTHER): Payer: Medicare HMO | Admitting: Family Medicine

## 2022-02-25 VITALS — BP 122/80 | HR 63 | Temp 97.9°F | Ht 65.0 in | Wt 193.0 lb

## 2022-02-25 DIAGNOSIS — I10 Essential (primary) hypertension: Secondary | ICD-10-CM | POA: Diagnosis not present

## 2022-02-25 DIAGNOSIS — Z7189 Other specified counseling: Secondary | ICD-10-CM

## 2022-02-25 DIAGNOSIS — Z0000 Encounter for general adult medical examination without abnormal findings: Secondary | ICD-10-CM

## 2022-02-25 DIAGNOSIS — E7800 Pure hypercholesterolemia, unspecified: Secondary | ICD-10-CM

## 2022-02-25 DIAGNOSIS — K219 Gastro-esophageal reflux disease without esophagitis: Secondary | ICD-10-CM | POA: Diagnosis not present

## 2022-02-25 DIAGNOSIS — R198 Other specified symptoms and signs involving the digestive system and abdomen: Secondary | ICD-10-CM

## 2022-02-25 DIAGNOSIS — I517 Cardiomegaly: Secondary | ICD-10-CM

## 2022-02-25 LAB — COMPREHENSIVE METABOLIC PANEL
ALT: 19 U/L (ref 0–35)
AST: 19 U/L (ref 0–37)
Albumin: 4.4 g/dL (ref 3.5–5.2)
Alkaline Phosphatase: 52 U/L (ref 39–117)
BUN: 8 mg/dL (ref 6–23)
CO2: 30 mEq/L (ref 19–32)
Calcium: 10 mg/dL (ref 8.4–10.5)
Chloride: 102 mEq/L (ref 96–112)
Creatinine, Ser: 0.65 mg/dL (ref 0.40–1.20)
GFR: 87.63 mL/min (ref >60.00)
Glucose, Bld: 94 mg/dL (ref 70–99)
Potassium: 4.1 mEq/L (ref 3.5–5.1)
Sodium: 139 mEq/L (ref 135–145)
Total Bilirubin: 0.7 mg/dL (ref 0.2–1.2)
Total Protein: 7.3 g/dL (ref 6.0–8.3)

## 2022-02-25 LAB — LIPID PANEL
Cholesterol: 188 mg/dL (ref 0–200)
HDL: 43.9 mg/dL (ref >39.00)
NonHDL: 144.35
Total CHOL/HDL Ratio: 4
Triglycerides: 236 mg/dL — ABNORMAL HIGH (ref 0.0–149.0)
VLDL: 47.2 mg/dL — ABNORMAL HIGH (ref 0.0–40.0)

## 2022-02-25 LAB — CBC WITH DIFFERENTIAL/PLATELET
Basophils Absolute: 0.1 10*3/uL (ref 0.0–0.1)
Basophils Relative: 1.2 % (ref 0.0–3.0)
Eosinophils Absolute: 0.1 10*3/uL (ref 0.0–0.7)
Eosinophils Relative: 2.8 % (ref 0.0–5.0)
HCT: 37.8 % (ref 36.0–46.0)
Hemoglobin: 12.3 g/dL (ref 12.0–15.0)
Lymphocytes Relative: 28.2 % (ref 12.0–46.0)
Lymphs Abs: 1.4 10*3/uL (ref 0.7–4.0)
MCHC: 32.6 g/dL (ref 30.0–36.0)
MCV: 84.6 fl (ref 78.0–100.0)
Monocytes Absolute: 0.4 10*3/uL (ref 0.1–1.0)
Monocytes Relative: 8.4 % (ref 3.0–12.0)
Neutro Abs: 3 10*3/uL (ref 1.4–7.7)
Neutrophils Relative %: 59.4 % (ref 43.0–77.0)
Platelets: 215 10*3/uL (ref 150.0–400.0)
RBC: 4.47 Mil/uL (ref 3.87–5.11)
RDW: 13.6 % (ref 11.5–15.5)
WBC: 5 10*3/uL (ref 4.0–10.5)

## 2022-02-25 LAB — LDL CHOLESTEROL, DIRECT: Direct LDL: 107 mg/dL

## 2022-02-25 LAB — TSH: TSH: 1.22 u[IU]/mL (ref 0.35–5.50)

## 2022-02-25 MED ORDER — PRAVASTATIN SODIUM 20 MG PO TABS: 20.0000 mg | ORAL_TABLET | Freq: Every day | ORAL | 3 refills | Status: DC

## 2022-02-25 MED ORDER — ESTRADIOL 0.1 MG/GM VA CREA: TOPICAL_CREAM | VAGINAL | 0 refills | Status: DC

## 2022-02-25 NOTE — Progress Notes (Unsigned)
Elevated Cholesterol: Using medications without problems: yes Muscle aches: not since taking coQ10.   Diet compliance: yes Exercise: yes See notes on labs.  Hypertension, history of LVH.  Using medication without problems or lightheadedness: yes Chest pain with exertion: no but she feels an occ "bubbling" in the chest, noted on a hot day and after skipping a meal.  Improved with baking soda and burping.   Edema: no Short of breath: no She walking 64mn on the treadmill.    Occ/rare brief R sided abd pain.  Can last a few seconds.  4 times in the last 2 months.  No blood in stool.  H/o constipation.  Has been using metamucil.  Discussed constipation management.  Flu yearly Shingles 2014 PNA 2016 Tetanus 09/2014, done out of clinic.  covid vaccine 2021 Colonoscopy 2022  Mammogram 2022 DXA declined, d/w pt 2023 Advance directive- son MLinton Rumpdesignated if patient were incapacitated.    We talked about managing episodic insomnia and sleep hygiene.  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  D/w pt.     She is working on diet and exercise. D/w pt.  PMH and SH reviewed  Meds, vitals, and allergies reviewed.   ROS: Per HPI unless specifically indicated in ROS section   GEN: nad, alert and oriented HEENT: ncat NECK: supple w/o LA CV: rrr. PULM: ctab, no inc wob ABD: soft, +bs EXT: no edema SKIN: no acute rash  30 minutes were devoted to patient care in this encounter (this includes time spent reviewing the patient's file/history, interviewing and examining the patient, counseling/reviewing plan with patient).

## 2022-02-25 NOTE — Patient Instructions (Signed)
Go to the lab on the way out.   If you have mychart we'll likely use that to update you.    °Take care.  Glad to see you. °Update me as needed.   °

## 2022-02-27 DIAGNOSIS — K219 Gastro-esophageal reflux disease without esophagitis: Secondary | ICD-10-CM | POA: Insufficient documentation

## 2022-02-27 NOTE — Assessment & Plan Note (Signed)
Discussed trying to increase fiber in her diet and then using Metamucil regularly and she can update me as needed.

## 2022-02-27 NOTE — Assessment & Plan Note (Signed)
Flu yearly Shingles 2014 PNA 2016 Tetanus 09/2014, done out of clinic.  covid vaccine 2021 Colonoscopy 2022  Mammogram 2022 DXA declined, d/w pt 2023 Advance directive- son Linton Rump designated if patient were incapacitated.

## 2022-02-27 NOTE — Assessment & Plan Note (Signed)
Likely causing the "bubbling" in her chest.  Reasonable to use baking soda as needed and discussed trigger avoidance.

## 2022-02-27 NOTE — Assessment & Plan Note (Signed)
Continue verapamil.  Continue work on diet and exercise.

## 2022-02-27 NOTE — Assessment & Plan Note (Signed)
Reasonable to continue pravastatin.  Continue work on diet and exercise.

## 2022-02-27 NOTE — Assessment & Plan Note (Signed)
Advance directive- son Orvan July if patient were incapacitated.

## 2022-04-17 ENCOUNTER — Ambulatory Visit (INDEPENDENT_AMBULATORY_CARE_PROVIDER_SITE_OTHER): Payer: Medicare HMO | Admitting: Family Medicine

## 2022-04-17 ENCOUNTER — Encounter: Payer: Self-pay | Admitting: Family Medicine

## 2022-04-17 VITALS — BP 120/70 | HR 76 | Temp 97.9°F | Ht 65.0 in | Wt 193.2 lb

## 2022-04-17 DIAGNOSIS — M25511 Pain in right shoulder: Secondary | ICD-10-CM | POA: Diagnosis not present

## 2022-04-17 DIAGNOSIS — M7050 Other bursitis of knee, unspecified knee: Secondary | ICD-10-CM | POA: Diagnosis not present

## 2022-04-17 DIAGNOSIS — M25561 Pain in right knee: Secondary | ICD-10-CM

## 2022-04-17 NOTE — Progress Notes (Signed)
[name] [name], [name], [name] at [facility] [name], [name]  Phone: [phone]  FAX: [phone]  [name] - 71 y.o. female  MRN [id number]  Date of Birth: [birth year]  Date: 04/17/2022  PCP: [name], [name]  Referral: [name], [name]  Chief Complaint  Patient presents with   Knee Pain    Right   Shoulder Pain   Hip Pain    Right   Subjective:   [name] is a 71 y.o. very pleasant female patient with Body mass index is 32.16 kg/m. who presents with the following:  The patient presents with some ongoing right-sided knee pain.  Rode to [place], has been hurting for about a week.  It was hurting really bad, then it went away.  Has been hurting off and on for a few months.  Recently, a a couple of weeks ago, it was hurting a lot.   She did not have any kind of specific injury at all, but after her ride she did notice that her anterior knee started to hurt.  She is really more anteromedial and just inferior to the knee.  She also has some mild joint line tenderness, more medially.  Also, shoulder is hurting deep and inside.  Has been hurting for a few months.  She does have some crepitus with motion, she has not had any kind of repetitive motion recently.  She has not had any trauma, nor has she ever had any major trauma, event to the shoulder, or any kind of operative intervention.  No old trauma, injuries, surgery.   Review of Systems is noted in the HPI, as appropriate  Objective:   BP 120/70   Pulse 76   Temp 97.9 F (36.6 C) (Oral)   Ht '5\' 5"'$  (1.651 m)   Wt 193 lb 4 oz (87.7 kg)   SpO2 95%   BMI 32.16 kg/m   GEN: No acute distress; alert,appropriate. PULM: Breathing comfortably in no respiratory distress PSYCH: Normally interactive.   Knee: Right Gait: Normal heel toe pattern ROM: 0-1 20 Effusion: neg Echymosis or edema: none Patellar tendon NT Painful  PLICA: neg Patellar grind: negative Medial and lateral patellar facet loading: negative medial and lateral joint lines: Medial joint line tenderness There is tenderness at the pes anserine bursa, this is the area of maximal tenderness on exam Mcmurray's neg Flexion-pinch neg Varus and valgus stress: stable Lachman: neg Ant and Post drawer: neg Hip abduction, IR, ER: WNL Hip flexion str: 5/5 Hip abd: 5/5 Quad: 5/5 VMO atrophy:No Hamstring concentric and eccentric: 5/5   Shoulder: R Inspection: No muscle wasting or winging Ecchymosis/edema: neg  AC joint, scapula, clavicle: NT Cervical spine: NT, full ROM Spurling's: neg Abduction: full, 5/5 Flexion: full, 5/5 IR, full, lift-off: 5/5 ER at neutral: full, 5/5 AC crossover and compression: neg Neer: neg Hawkins: neg Drop Test: neg Empty Can: neg Supraspinatus insertion: NT Bicipital groove: NT Speed's: neg Yergason's: neg Sulcus sign: neg Scapular dyskinesis: none C5-T1 intact Sensation intact Grip 5/5   Laboratory and Imaging Data:  Assessment and Plan:     ICD-10-CM   1. Pes anserine bursitis  M70.50     2. Acute pain of right knee  M25.561     3. Acute pain of right shoulder  M25.511      Acute pes anserine bursitis.  There is also some mild medial joint line tenderness, with likely  some underlying arthritis.  For now, she is going to do some ibuprofen 3 times daily over the next week as she will use some Voltaren gel.  Shoulder exam is fairly benign, she does have some occasional deep ache and is mild crepitus, suspect she may have some glenohumeral arthritis, but right now this is not really flared up and bothering her all that much.  Follow-up as needed  [employer] speech-to-text software was used for transcription in this dictation.  Possible transcriptional errors can occur using Editor, commissioning.   Signed,  [name], [name]   Outpatient Encounter Medications as of 04/17/2022   Medication Sig   b complex vitamins tablet Take 1 tablet by mouth daily.   Black Currant Seed Oil 500 MG CAPS Take 1 capsule by mouth daily.   cholecalciferol (VITAMIN D3) 25 MCG (1000 UNIT) tablet Take 1,000 Units by mouth daily.   Coenzyme Q10 (CO Q 10 PO) Take by mouth.   estradiol (ESTRACE) 0.1 MG/GM vaginal cream APPLY A PEA SIZED AMOUNT TO AFFECTED AREA TWICE A WEEK AS NEEDED   fluticasone (FLONASE) 50 MCG/ACT nasal spray Place 2 sprays into both nostrils daily.   pravastatin (PRAVACHOL) 20 MG tablet Take 1 tablet (20 mg total) by mouth daily.   verapamil (CALAN-SR) 240 MG CR tablet TAKE 1 TABLET EVERY DAY   No facility-administered encounter medications on file as of 04/17/2022.

## 2022-06-04 ENCOUNTER — Ambulatory Visit (HOSPITAL_COMMUNITY): Payer: Medicare HMO | Attending: Cardiology

## 2022-06-04 DIAGNOSIS — I517 Cardiomegaly: Secondary | ICD-10-CM | POA: Insufficient documentation

## 2022-06-04 MED ORDER — PERFLUTREN LIPID MICROSPHERE
1.0000 mL | INTRAVENOUS | Status: AC | PRN
  Administered 2022-06-04: 1 mL via INTRAVENOUS

## 2022-06-05 LAB — ECHOCARDIOGRAM COMPLETE
Area-P 1/2: 3.08 cm2
S' Lateral: 2.2 cm

## 2022-06-07 ENCOUNTER — Ambulatory Visit (INDEPENDENT_AMBULATORY_CARE_PROVIDER_SITE_OTHER): Payer: Medicare HMO | Admitting: Family Medicine

## 2022-06-07 ENCOUNTER — Encounter: Payer: Self-pay | Admitting: Family Medicine

## 2022-06-07 VITALS — BP 116/62 | HR 74 | Temp 97.9°F | Ht 65.0 in | Wt 193.0 lb

## 2022-06-07 DIAGNOSIS — H6990 Unspecified Eustachian tube disorder, unspecified ear: Secondary | ICD-10-CM | POA: Diagnosis not present

## 2022-06-07 DIAGNOSIS — R198 Other specified symptoms and signs involving the digestive system and abdomen: Secondary | ICD-10-CM

## 2022-06-07 NOTE — Patient Instructions (Signed)
Restart flonase and gently try to pop you ears.  Let me know if that isn't helping.  Take care.  Glad to see you.

## 2022-06-07 NOTE — Progress Notes (Unsigned)
Patient has been having fluttering/vibration feeling in R ear x couple of weeks. No pain or does not feel any fullness in ear.  Comes and goes, not in the last 2 days.  Not using flonase much.  Not a ticking sound.  No pain.  Prev with scant ear canal discharge once, that was 2 days ago.  No L ear sx.  No change in hearing.  Some soreness in the neck, down to R shoulder.  Massage helped. No ST.  No fever, some runny nose (typical for this time of year).  She has occ brief L frontal HA, quick onset and quick resolution.    Recently back form cruise to [place] and other [place].  She had fun.  Meds, vitals, and allergies reviewed.   ROS: Per HPI unless specifically indicated in ROS section   ETD on exam. No movement with valsalva.  Weber louder on R ear, ie the affected ear.   Update me if not better.   R sided abd pain vs L sided abd pain.  Can last a few hours, then subside. Can happen at night.  Noted after eating.  Better with fasting.    It feels like she is scared of heights but w/o the exposure.   No blood in stool.  Try bentyl?  Check old records.  Started ~2006.

## 2022-06-09 ENCOUNTER — Other Ambulatory Visit: Payer: Self-pay | Admitting: Family Medicine

## 2022-06-09 NOTE — Assessment & Plan Note (Signed)
Benign abdominal exam and I want to consider options.  See follow-up phone note.  Colonoscopy 2022.  Given the timeline I do not suspect an ominous issue, discussed.

## 2022-06-09 NOTE — Assessment & Plan Note (Signed)
Anatomy discussed with patient.  Use Flonase and gently perform Valsalva.  Update me as needed.  She agrees to plan.

## 2022-06-09 NOTE — Telephone Encounter (Signed)
Please check with patient.  I looked back to her old records.  Dicyclomine/Bentyl would be a reasonable thing to use if she were having colon spasm causing her symptoms.  She had a prescription for this last year.  Did she try it, did she see any significant improvement with that?  If she did improve, or she does not remember it having effect, I would try it again.  I have the prescription pended below.  Please let me know if she specifically recalls that it did not help.  Thanks.

## 2022-06-10 ENCOUNTER — Encounter: Payer: Self-pay | Admitting: *Deleted

## 2022-06-12 NOTE — Telephone Encounter (Signed)
Patient called back in returning a call she received.  

## 2022-06-12 NOTE — Telephone Encounter (Signed)
LMTCB

## 2022-06-14 ENCOUNTER — Other Ambulatory Visit (HOSPITAL_COMMUNITY): Payer: Self-pay | Admitting: Family Medicine

## 2022-06-14 DIAGNOSIS — Z1231 Encounter for screening mammogram for malignant neoplasm of breast: Secondary | ICD-10-CM

## 2022-06-14 NOTE — Telephone Encounter (Signed)
Spoke with patient and she does not ever remember taking that medication but is up to trying it. Wants sent to [facility], [place]

## 2022-06-16 MED ORDER — DICYCLOMINE HCL 20 MG PO TABS
20.0000 mg | ORAL_TABLET | Freq: Three times a day (TID) | ORAL | 1 refills | Status: DC
Start: 2022-06-16 — End: 2023-05-23

## 2022-06-16 NOTE — Telephone Encounter (Signed)
Sent.  Thanks.  Please have her update [place] in about 1 week to let me know if she notices any improvement.

## 2022-06-26 DIAGNOSIS — M542 Cervicalgia: Secondary | ICD-10-CM | POA: Diagnosis not present

## 2022-06-26 DIAGNOSIS — M9901 Segmental and somatic dysfunction of cervical region: Secondary | ICD-10-CM | POA: Diagnosis not present

## 2022-06-26 DIAGNOSIS — M9902 Segmental and somatic dysfunction of thoracic region: Secondary | ICD-10-CM | POA: Diagnosis not present

## 2022-06-26 DIAGNOSIS — M546 Pain in thoracic spine: Secondary | ICD-10-CM | POA: Diagnosis not present

## 2022-06-27 ENCOUNTER — Encounter: Payer: Self-pay | Admitting: Family Medicine

## 2022-06-27 ENCOUNTER — Ambulatory Visit (INDEPENDENT_AMBULATORY_CARE_PROVIDER_SITE_OTHER): Payer: Medicare HMO | Admitting: Family Medicine

## 2022-06-27 VITALS — BP 150/90 | HR 76 | Temp 97.7°F | Ht 65.0 in | Wt 193.2 lb

## 2022-06-27 DIAGNOSIS — M542 Cervicalgia: Secondary | ICD-10-CM

## 2022-06-27 MED ORDER — KETOROLAC TROMETHAMINE 30 MG/ML IJ SOLN
30.0000 mg | Freq: Once | INTRAMUSCULAR | Status: AC
  Administered 2022-06-27: 30 mg via INTRAMUSCULAR

## 2022-06-27 MED ORDER — KETOROLAC TROMETHAMINE 30 MG/ML IJ SOLN: 30.0000 mg | Freq: Once | INTRAMUSCULAR | Status: DC

## 2022-06-27 MED ORDER — CYCLOBENZAPRINE HCL 10 MG PO TABS: 5.0000 mg | ORAL_TABLET | Freq: Three times a day (TID) | ORAL | 0 refills | Status: DC | PRN

## 2022-06-27 NOTE — Progress Notes (Signed)
Cardiology Office Note   Date:  06/28/2022   ID:  Maansi, Wike August 23, 1950, MRN [id number]  PCP:  Tonia Ghent, MD  Cardiologist:   Minus Breeding, MD   Chief Complaint  Patient presents with   Dizziness      History of Present Illness: [name] is a 71 y.o. female who presents for evaluation of mild ventricular hypertrophy. She's also had palpitations. An echo in 2010 suggested moderate mitral regurgitation directed posteriorly. There was LVH. This was severe. She is also had some aortic insufficiency. However followup echocardiography in 2013 suggested only mild mitral regurgitation and no significant ventricular hypertrophy or aortic insufficiency.    Her echo in Nov 2023 demonstrated mild LVH with a mild intracavitary gradient.      She called to be added on because she had an episode of dizziness.  This was in the mid November.  Felt her heart racing somewhat.  Peripheral vision seemed closing on her.  She became dizzy.  This lasted for several seconds.  This is similar to what she described in 2022. She was found to have some plaque in the left carotid bifurcation and proximal left ICA less than 50% but no obstructive carotid disease.  Monitor demonstrated some episodes of SVT which could have been related to her symptoms.  She does occasionally feel her heart racing.  She has not had any frank syncope.  She has not had any new chest pressure, neck or arm discomfort.  She had no new orders of breath, PND or orthopnea.   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   Hyperlipidemia    Hypertrophic cardiomyopathy (HCC)    Palpitations    Trigger finger of left hand 09/2012   left long finger    Past Surgical History:  Procedure Laterality Date   BREAST REDUCTION SURGERY Bilateral 2001    CHOLECYSTECTOMY  07/17/2011   Procedure: LAPAROSCOPIC CHOLECYSTECTOMY WITH INTRAOPERATIVE CHOLANGIOGRAM;  Surgeon: Haywood Lasso, MD;  Location: [facility];  Service: General;  Laterality: N/A;   COLONOSCOPY     FOOT GANGLION EXCISION Right    [place] Right 1996   thumb   MYOMECTOMY  10/09/99   x 2   OOPHORECTOMY Right 1980   OOPHORECTOMY Left [place]   PARTIAL HYSTERECTOMY  2002   with no remaining ovary   POLYPECTOMY     TRIGGER FINGER RELEASE Left 09/28/2012   Procedure: RELEASE TRIGGER FINGER/A-1 PULLEY;  Surgeon: Jolyn Nap, MD;  Location: [facility];  Service: Orthopedics;  Laterality: Left;   UPPER GASTROINTESTINAL ENDOSCOPY       Current Outpatient Medications  Medication Sig Dispense Refill   b complex vitamins tablet Take 1 tablet by mouth daily.     Black Currant Seed Oil 500 MG CAPS Take 1 capsule by mouth daily.     cholecalciferol (VITAMIN D3) 25 MCG (1000 UNIT) tablet Take 1,000 Units by mouth daily.     Coenzyme Q10 (CO Q 10 PO) Take by mouth.     cyclobenzaprine (FLEXERIL) 10 MG tablet Take 0.5-1 tablets (5-10 mg total) by mouth 3 (three) times daily as needed for muscle  spasms (neck pain /spasm). 30 tablet 0   dicyclomine (BENTYL) 20 MG tablet Take 1 tablet (20 mg total) by mouth 3 (three) times daily before meals. 90 tablet 1   estradiol (ESTRACE) 0.1 MG/GM vaginal cream APPLY A PEA SIZED AMOUNT TO AFFECTED AREA TWICE A WEEK AS NEEDED 42.5 g 0   fluticasone (FLONASE) 50 MCG/ACT nasal spray Place 2 sprays into both nostrils daily. 16 g 12   pravastatin (PRAVACHOL) 20 MG tablet Take 1 tablet (20 mg total) by mouth daily. 90 tablet 3   verapamil (CALAN-SR) 240 MG CR tablet TAKE 1 TABLET EVERY DAY 90 tablet 3   No current facility-administered medications for this visit.    Allergies:   Morphine sulfate and Penicillins    ROS:  Please see the history of present illness.   Otherwise,  review of systems are positive for neck stiffness and pain acutely.   All other systems are reviewed and negative.    PHYSICAL EXAM: VS:  BP 120/80 (BP Location: Left Arm, Patient Position: Sitting, Cuff Size: Normal)   Pulse 78   Ht '5\' 5"'$  (1.651 m)   Wt 196 lb (88.9 kg)   BMI 32.62 kg/m  , BMI Body mass index is 32.62 kg/m. GENERAL:  Well appearing NECK:  No jugular venous distention, waveform within normal limits, carotid upstroke brisk and symmetric, no bruits, no thyromegaly LUNGS:  Clear to auscultation bilaterally CHEST:  Unremarkable HEART:  PMI not displaced or sustained,S1 and S2 within normal limits, no S3, no S4, no clicks, no rubs, no murmurs ABD:  Flat, positive bowel sounds normal in frequency in pitch, no bruits, no rebound, no guarding, no midline pulsatile mass, no hepatomegaly, no splenomegaly EXT:  2 plus pulses throughout, no edema, no cyanosis no clubbing   EKG:  EKG  ordered today. The ekg ordered  demonstrates sinus rhythm, rate 78, axis within normal limits, nonspecific ST-T wave changes, sinus arrhythmia.   Recent Labs: 02/25/2022: ALT 19; BUN 8; Creatinine, Ser 0.65; Hemoglobin 12.3; Platelets 215.0; Potassium 4.1; Sodium 139; TSH 1.22    Lipid Panel    Component Value Date/Time   CHOL 188 02/25/2022 1121   TRIG 236.0 (H) 02/25/2022 1121   HDL 43.90 02/25/2022 1121   CHOLHDL 4 02/25/2022 1121   VLDL 47.2 (H) 02/25/2022 1121   LDLCALC 150 (H) 07/26/2021 0726   LDLDIRECT 107.0 02/25/2022 1121      Wt Readings from Last 3 Encounters:  06/28/22 196 lb (88.9 kg)  06/27/22 193 lb 4 oz (87.7 kg)  06/07/22 193 lb (87.5 kg)      Other studies Reviewed: Additional studies/ records that were reviewed today include: Echocardiogram, labs. Review of the above records demonstrates:  Please see elsewhere in the note.     ASSESSMENT AND PLAN:  DIZZINESS:   Somewhat difficult going on when she had her episode November.   Symptoms have been infrequent.   Will check another carotid Doppler but otherwise no workup.  She will let me know if these get worse.   HTN:   Blood pressures is at target.  No change in therapy.   PALPITATIONS:   She had a monitor as above in 2021.  She will let me know if she has any increasing symptoms at which point I might need to reapply a monitor.   DYSLIPIDEMIA:    She is been noted to have an elevated LDL and has been placed on Crestor by her primary provider.    I do  not have the most recent LDL.  However, she states she is having is followed by her primary provider and her triglycerides are elevated.  I will defer to his management.  AORTIC ATHEROSCLEROSIS:   I agree with aggressive risk reduction.  LVH:    This was mild.  I will follow this clinically  ABNORMAL EKG:   She had exertion.  These are unchanged.    Current medicines are reviewed at length with the patient today.  The patient does not have concerns regarding medicines.  The following changes have been made: None  Labs/ tests ordered today include:   Orders Placed This Encounter  Procedures   EKG 12-Lead   VAS US CAROTID     Disposition:   Follow-up with me in 6   Signed, Minus Breeding, MD  06/28/2022 11:04 AM    [place]

## 2022-06-27 NOTE — Assessment & Plan Note (Signed)
Spasm, consistent with torticollis  Pain with movement - very uncomfortable  No s/s of infection   Px flexeril (she has tol well in the past) with caution of sedation  Adv use if ice/heat as needed Toradol 30 mg today IM  At home can resume ibuprofen tonight if needed  Given handout with neck stretches to consider once her symptoms improve May need to consider PT later  Disc imp of appropriate pillow/head support  Discussed ER precautions

## 2022-06-27 NOTE — Patient Instructions (Addendum)
Toradol shot today   Take the flexeril as needed and be caution of sedation   Use ice  Then try heat later to see if it helps   Keep your head supported / if the donut pillow helps then use it   Then starting tonight you can go back to ibuprofen up to 800 mg with food every 8-12 hours as needed   See the chiropractor as planned   Update [place] next week   If symptoms worsen/ become severe go to the ER  Take a look at the neck stretches when you improve

## 2022-06-27 NOTE — Progress Notes (Signed)
Subjective:    Patient ID: [name], female    DOB: [birth year], 71 y.o.   MRN: [id number]  HPI 71 yo pt of Dr Damita Dunnings presents for neck pain   Wt Readings from Last 3 Encounters:  06/27/22 193 lb 4 oz (87.7 kg)  06/07/22 193 lb (87.5 kg)  04/17/22 193 lb 4 oz (87.7 kg)   32.16 kg/m  Pain started in R trap area - for about a month  Thought she strained a muscle   This happened after she tried a new swan shaped pillow  Stopped when she started hurting   Then started to moved up the neck into her head (Monday) Worse when she moves/even when she talks   This is new  Went to the chiropractor yesterday  Did a manipulation and it helped at the time, she goes back tomorrow   No fever  No uri symptoms   Otc Bio freeze pads Back and body- something Tylenol Ibuprofen - 3 pills  3 tylenol also   Patient Active Problem List   Diagnosis Date Noted   GERD (gastroesophageal reflux disease) 02/27/2022   Vaginal pain 11/07/2021   Vaginal odor 11/07/2021   Vaginal atrophy 11/07/2021   Occipital neuralgia 02/18/2021   Papule 12/31/2020   Plantar fasciitis 10/11/2020   Dysphagia 10/11/2020   Abdominal symptoms 05/07/2020   Rhinorrhea 05/07/2020   TIA (transient ischemic attack) 09/29/2019   Seasonal allergies 04/14/2019   Healthcare maintenance 11/04/2018   Neck pain 11/27/2017   Aortic atherosclerosis ([place]) 09/30/2017   HLD (hyperlipidemia) 09/30/2017   Advance care planning 06/07/2015   Muscle strain 12/09/2014   ETD (eustachian tube dysfunction) 05/04/2014   Paresthesia 03/16/2014   Achilles tendonitis 09/15/2013   Balance problem 03/19/2013   Cough 08/25/2012   Rash 07/10/2012   History of hepatitis C 05/29/2011   Mitral and aortic regurgitation 01/24/2011   Medicare annual wellness visit, subsequent 01/12/2011   LVH (left ventricular hypertrophy) 05/03/2009   DIVERTICULITIS OF COLON 03/02/2009   External hemorrhoids 10/24/2008   Past Medical History:   Diagnosis Date   Allergy    Anemia    age 48    Diverticulosis    GERD (gastroesophageal reflux disease)    occasional; OTC as needed   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   Hyperlipidemia    Hypertrophic cardiomyopathy (HCC)    Palpitations    Trigger finger of left hand 09/2012   left long finger   Past Surgical History:  Procedure Laterality Date   BREAST REDUCTION SURGERY Bilateral 2001   CHOLECYSTECTOMY  07/17/2011   Procedure: LAPAROSCOPIC CHOLECYSTECTOMY WITH INTRAOPERATIVE CHOLANGIOGRAM;  Surgeon: Haywood Lasso, MD;  Location: [facility];  Service: General;  Laterality: N/A;   COLONOSCOPY     FOOT GANGLION EXCISION Right    HEMORRHOID SURGERY  1980   LACERATION REPAIR Right 1996   thumb   MYOMECTOMY  10/09/99   x 2   OOPHORECTOMY Right 1980   OOPHORECTOMY Left [place]   PARTIAL HYSTERECTOMY  2002   with no remaining ovary   POLYPECTOMY     TRIGGER FINGER RELEASE Left 09/28/2012   Procedure: RELEASE TRIGGER FINGER/A-1 PULLEY;  Surgeon: Jolyn Nap, MD;  Location: [facility];  Service: Orthopedics;  Laterality: Left;   UPPER GASTROINTESTINAL ENDOSCOPY     Social History   Tobacco Use   Smoking status: Former    Packs/day: 2.50  Years: 42.00    Total pack years: 105.00    Types: Cigarettes    Quit date: 07/07/2005    Years since quitting: 16.9   Smokeless tobacco: Never   Tobacco comments:    quit smoking 2008  Vaping Use   Vaping Use: Never used  Substance Use Topics   Alcohol use: Not Currently   Drug use: No    Comment: previous hx. of   Family History  Problem Relation Age of Onset   Colon cancer Father 32   Diabetes Sister    Hepatitis C Brother    Hypertension Brother    Hepatitis C Brother    Colon cancer Paternal Aunt        dx'd in her 2's    Breast cancer Paternal Aunt        two aunts   Lung cancer Paternal Uncle    Breast cancer Cousin    Liver cancer Cousin    Colon  cancer Other        cousin    Colon polyps Neg Hx    Esophageal cancer Neg Hx    Rectal cancer Neg Hx    Stomach cancer Neg Hx    Allergies  Allergen Reactions   Morphine Sulfate Swelling   Penicillins Swelling   Current Outpatient Medications on File Prior to Visit  Medication Sig Dispense Refill   b complex vitamins tablet Take 1 tablet by mouth daily.     Black Currant Seed Oil 500 MG CAPS Take 1 capsule by mouth daily.     cholecalciferol (VITAMIN D3) 25 MCG (1000 UNIT) tablet Take 1,000 Units by mouth daily.     Coenzyme Q10 (CO Q 10 PO) Take by mouth.     dicyclomine (BENTYL) 20 MG tablet Take 1 tablet (20 mg total) by mouth 3 (three) times daily before meals. 90 tablet 1   estradiol (ESTRACE) 0.1 MG/GM vaginal cream APPLY A PEA SIZED AMOUNT TO AFFECTED AREA TWICE A WEEK AS NEEDED 42.5 g 0   fluticasone (FLONASE) 50 MCG/ACT nasal spray Place 2 sprays into both nostrils daily. 16 g 12   pravastatin (PRAVACHOL) 20 MG tablet Take 1 tablet (20 mg total) by mouth daily. 90 tablet 3   verapamil (CALAN-SR) 240 MG CR tablet TAKE 1 TABLET EVERY DAY 90 tablet 3   No current facility-administered medications on file prior to visit.    Review of Systems  Constitutional:  Negative for activity change, appetite change, fatigue, fever and unexpected weight change.  HENT:  Negative for congestion, ear pain, rhinorrhea, sinus pressure and sore throat.   Eyes:  Negative for pain, redness and visual disturbance.  Respiratory:  Negative for cough, shortness of breath and wheezing.   Cardiovascular:  Negative for chest pain and palpitations.  Gastrointestinal:  Negative for abdominal pain, blood in stool, constipation and diarrhea.  Endocrine: Negative for polydipsia and polyuria.  Genitourinary:  Negative for dysuria, frequency and urgency.  Musculoskeletal:  Positive for neck pain and neck stiffness. Negative for arthralgias, back pain and myalgias.  Skin:  Negative for pallor and rash.   Allergic/Immunologic: Negative for environmental allergies.  Neurological:  Negative for dizziness, syncope and headaches.  Hematological:  Negative for adenopathy. Does not bruise/bleed easily.  Psychiatric/Behavioral:  Negative for decreased concentration and dysphoric mood. The patient is not nervous/anxious.        Objective:   Physical Exam Constitutional:      General: She is not in acute distress.  Appearance: Normal appearance. She is obese. She is not ill-appearing or diaphoretic.  HENT:     Head:     Comments: No facial or scalp tenderness Eyes:     General:        Right eye: No discharge.        Left eye: No discharge.     Conjunctiva/sclera: Conjunctivae normal.     Pupils: Pupils are equal, round, and reactive to light.  Musculoskeletal:        General: No swelling.     Cervical back: Spasms, torticollis and tenderness present. No swelling, edema, deformity, erythema, signs of trauma, bony tenderness or crepitus. Pain with movement present. Decreased range of motion.     Comments: Any neck movement can cause pain  Worst is to rotate to the R She can move but has pain   Skin:    General: Skin is warm.     Findings: No erythema or rash.  Neurological:     Mental Status: She is alert.     Cranial Nerves: No cranial nerve deficit.     Sensory: No sensory deficit.     Motor: No weakness.     Coordination: Coordination normal.     Deep Tendon Reflexes: Reflexes normal.  Psychiatric:        Mood and Affect: Mood normal.           Assessment & Plan:   Problem List Items Addressed This Visit       Other   Neck pain - Primary    Spasm, consistent with torticollis  Pain with movement - very uncomfortable  No s/s of infection   Px flexeril (she has tol well in the past) with caution of sedation  Adv use if ice/heat as needed Toradol 30 mg today IM  At home can resume ibuprofen tonight if needed  Given handout with neck stretches to consider once her  symptoms improve May need to consider PT later  Disc imp of appropriate pillow/head support  Discussed ER precautions

## 2022-06-28 ENCOUNTER — Ambulatory Visit: Payer: Medicare HMO | Attending: Cardiology | Admitting: Cardiology

## 2022-06-28 ENCOUNTER — Ambulatory Visit (HOSPITAL_COMMUNITY)
Admission: RE | Admit: 2022-06-28 | Discharge: 2022-06-28 | Disposition: A | Discharge status: Home / Self Care | Payer: Medicare HMO | Source: Ambulatory Visit | Attending: Family Medicine | Admitting: Family Medicine

## 2022-06-28 ENCOUNTER — Encounter: Payer: Self-pay | Admitting: Cardiology

## 2022-06-28 VITALS — BP 120/80 | HR 78 | Ht 65.0 in | Wt 196.0 lb

## 2022-06-28 DIAGNOSIS — Z1231 Encounter for screening mammogram for malignant neoplasm of breast: Secondary | ICD-10-CM | POA: Diagnosis not present

## 2022-06-28 DIAGNOSIS — I700 Atherosclerosis of aorta: Secondary | ICD-10-CM | POA: Diagnosis not present

## 2022-06-28 DIAGNOSIS — R002 Palpitations: Secondary | ICD-10-CM | POA: Diagnosis not present

## 2022-06-28 DIAGNOSIS — E785 Hyperlipidemia, unspecified: Secondary | ICD-10-CM

## 2022-06-28 DIAGNOSIS — M9902 Segmental and somatic dysfunction of thoracic region: Secondary | ICD-10-CM | POA: Diagnosis not present

## 2022-06-28 DIAGNOSIS — I517 Cardiomegaly: Secondary | ICD-10-CM

## 2022-06-28 DIAGNOSIS — M546 Pain in thoracic spine: Secondary | ICD-10-CM | POA: Diagnosis not present

## 2022-06-28 DIAGNOSIS — M9901 Segmental and somatic dysfunction of cervical region: Secondary | ICD-10-CM | POA: Diagnosis not present

## 2022-06-28 DIAGNOSIS — M542 Cervicalgia: Secondary | ICD-10-CM | POA: Diagnosis not present

## 2022-06-28 NOTE — Patient Instructions (Signed)
Medication Instructions:  Continue same medications *If you need a refill on your cardiac medications before your next appointment, please call your pharmacy*   Lab Work: None ordered   Testing/Procedures: Carotid doppler   Follow-Up: At [facility], you and your health needs are our priority.  As part of our continuing mission to provide you with exceptional heart care, we have created designated Provider Care Teams.  These Care Teams include your primary Cardiologist (physician) and Advanced Practice Providers (APPs -  Physician Assistants and Nurse Practitioners) who all work together to provide you with the care you need, when you need it.  We recommend signing up for the patient portal called "MyChart".  Sign up information is provided on this After Visit Summary.  MyChart is used to connect with patients for Virtual Visits (Telemedicine).  Patients are able to view lab/test results, encounter notes, upcoming appointments, etc.  Non-urgent messages can be sent to your provider as well.   To learn more about what you can do with MyChart, go to [email or web address].    Your next appointment:  6 months    Call in March to schedule June appointment     The format for your next appointment: Office   Provider:  Dr.Hochrein   Important Information About Sugar

## 2022-07-03 DIAGNOSIS — M546 Pain in thoracic spine: Secondary | ICD-10-CM | POA: Diagnosis not present

## 2022-07-03 DIAGNOSIS — M542 Cervicalgia: Secondary | ICD-10-CM | POA: Diagnosis not present

## 2022-07-03 DIAGNOSIS — M9901 Segmental and somatic dysfunction of cervical region: Secondary | ICD-10-CM | POA: Diagnosis not present

## 2022-07-03 DIAGNOSIS — M9902 Segmental and somatic dysfunction of thoracic region: Secondary | ICD-10-CM | POA: Diagnosis not present

## 2022-07-10 DIAGNOSIS — M9901 Segmental and somatic dysfunction of cervical region: Secondary | ICD-10-CM | POA: Diagnosis not present

## 2022-07-10 DIAGNOSIS — M542 Cervicalgia: Secondary | ICD-10-CM | POA: Diagnosis not present

## 2022-07-10 DIAGNOSIS — M546 Pain in thoracic spine: Secondary | ICD-10-CM | POA: Diagnosis not present

## 2022-07-10 DIAGNOSIS — M9902 Segmental and somatic dysfunction of thoracic region: Secondary | ICD-10-CM | POA: Diagnosis not present

## 2022-07-26 ENCOUNTER — Ambulatory Visit (INDEPENDENT_AMBULATORY_CARE_PROVIDER_SITE_OTHER): Payer: Medicare HMO | Admitting: Family Medicine

## 2022-07-26 ENCOUNTER — Encounter: Payer: Self-pay | Admitting: Family Medicine

## 2022-07-26 VITALS — BP 150/80 | HR 92 | Temp 99.2°F | Ht 65.0 in | Wt 195.1 lb

## 2022-07-26 DIAGNOSIS — R051 Acute cough: Secondary | ICD-10-CM

## 2022-07-26 LAB — POC INFLUENZA A&B (BINAX/QUICKVUE)
Influenza A, POC: NEGATIVE
Influenza B, POC: NEGATIVE

## 2022-07-26 LAB — POC COVID19 BINAXNOW: SARS Coronavirus 2 Ag: NEGATIVE

## 2022-07-26 NOTE — Patient Instructions (Addendum)
Rest, fluids and time.  Call if new fever,  ear pain or face pain,  or symptoms worsening past day 5-7 of illness for consideration of antibiotics.  Start flonase 2 spray per nostril daily.  Can use nyquil as needed  If severe shortness of breath , go to ER.

## 2022-07-26 NOTE — Progress Notes (Signed)
Patient ID: [name], female    DOB: 1951/01/30, 72 y.o.   MRN: [id number]  This visit was conducted in person.  BP (!) 150/80   Pulse 92   Temp 99.2 F (37.3 C) (Oral)   Ht '5\' 5"'$  (1.651 m)   Wt 195 lb 2 oz (88.5 kg)   SpO2 96%   BMI 32.47 kg/m    CC:  Chief Complaint  Patient presents with   Cough    Started on Wednesday   Sneezing   Shortness of Breath         Subjective:   HPI: [name] is a 72 y.o. female presenting on 07/26/2022 for Cough (Started on Wednesday), Sneezing, and Shortness of Breath (/)  Date of onset:  3 days   Initial symptoms of  congesiton and ST.. now progressed to copiugh and sneeze, productive. No face pain, no ear pain.  Mild SOB,  occ wheeze.  No fever.  No body  aches, chest.   Able to rest at night.    Sick contacts:  none COVID testing:   none     She has tried to treat with  Nyquil .   No history of chronic lung disease  COPD on problem list.. per pt asthma as child. Non-smoker.       Relevant past medical, surgical, family and social history reviewed and updated as indicated. Interim medical history since our last visit reviewed. Allergies and medications reviewed and updated. Outpatient Medications Prior to Visit  Medication Sig Dispense Refill   b complex vitamins tablet Take 1 tablet by mouth daily.     Black Currant Seed Oil 500 MG CAPS Take 1 capsule by mouth daily.     cholecalciferol (VITAMIN D3) 25 MCG (1000 UNIT) tablet Take 1,000 Units by mouth daily.     Coenzyme Q10 (CO Q 10 PO) Take by mouth.     cyclobenzaprine (FLEXERIL) 10 MG tablet Take 0.5-1 tablets (5-10 mg total) by mouth 3 (three) times daily as needed for muscle spasms (neck pain /spasm). 30 tablet 0   dicyclomine (BENTYL) 20 MG tablet Take 1 tablet (20 mg total) by mouth 3 (three) times daily before meals. 90 tablet 1   estradiol (ESTRACE) 0.1 MG/GM vaginal cream APPLY A PEA SIZED AMOUNT TO AFFECTED AREA TWICE A WEEK AS NEEDED 42.5 g 0    fluticasone (FLONASE) 50 MCG/ACT nasal spray Place 2 sprays into both nostrils daily. 16 g 12   pravastatin (PRAVACHOL) 20 MG tablet Take 1 tablet (20 mg total) by mouth daily. 90 tablet 3   verapamil (CALAN-SR) 240 MG CR tablet TAKE 1 TABLET EVERY DAY 90 tablet 3   No facility-administered medications prior to visit.     Per HPI unless specifically indicated in ROS section below Review of Systems  Constitutional:  Negative for fatigue and fever.  HENT:  Positive for congestion and sneezing.   Eyes:  Negative for pain.  Respiratory:  Positive for cough and shortness of breath.   Cardiovascular:  Negative for chest pain, palpitations and leg swelling.  Gastrointestinal:  Negative for abdominal pain.  Genitourinary:  Negative for dysuria and vaginal bleeding.  Musculoskeletal:  Negative for back pain.  Neurological:  Negative for syncope, light-headedness and headaches.  Psychiatric/Behavioral:  Negative for dysphoric mood.    Objective:  BP (!) 150/80   Pulse 92   Temp 99.2 F (37.3 C) (Oral)   Ht '5\' 5"'$  (1.651 m)   Wt 195  lb 2 oz (88.5 kg)   SpO2 96%   BMI 32.47 kg/m   Wt Readings from Last 3 Encounters:  07/26/22 195 lb 2 oz (88.5 kg)  06/28/22 196 lb (88.9 kg)  06/27/22 193 lb 4 oz (87.7 kg)      Physical Exam    Results for orders placed or performed in visit on 07/26/22  POC COVID-19  Result Value Ref Range   SARS Coronavirus 2 Ag Negative Negative  POC Influenza A&B (Binax test)  Result Value Ref Range   Influenza A, POC Negative Negative   Influenza B, POC Negative Negative    Assessment and Plan  Acute cough -     POC COVID-19 BinaxNow -     POC Influenza A&B(BINAX/QUICKVUE)    No follow-ups on file.   Eliezer Lofts, MD

## 2022-07-26 NOTE — Progress Notes (Signed)
Patient ID: [name], female    DOB: 1950-10-03, 72 y.o.   MRN: [id number]  This visit was conducted in person.  BP (!) 150/80   Pulse 92   Temp 99.2 F (37.3 C) (Oral)   Ht '5\' 5"'$  (1.651 m)   Wt 195 lb 2 oz (88.5 kg)   SpO2 96%   BMI 32.47 kg/m    CC:  Chief Complaint  Patient presents with   Cough    Started on Wednesday   Sneezing   Shortness of Breath         Subjective:   HPI: [name] is a 72 y.o. female presenting on 07/26/2022 for Cough (Started on Wednesday), Sneezing, and Shortness of Breath (/)   Date of onset:  Initial symptoms included  Symptoms progressed to    [place] contacts:  COVID testing:   none     She has tried to treat with      No history of chronic lung disease such as asthma or COPD. Non-smoker.       Relevant past medical, surgical, family and social history reviewed and updated as indicated. Interim medical history since our last visit reviewed. Allergies and medications reviewed and updated. Outpatient Medications Prior to Visit  Medication Sig Dispense Refill   b complex vitamins tablet Take 1 tablet by mouth daily.     Black Currant Seed Oil 500 MG CAPS Take 1 capsule by mouth daily.     cholecalciferol (VITAMIN D3) 25 MCG (1000 UNIT) tablet Take 1,000 Units by mouth daily.     Coenzyme Q10 (CO Q 10 PO) Take by mouth.     cyclobenzaprine (FLEXERIL) 10 MG tablet Take 0.5-1 tablets (5-10 mg total) by mouth 3 (three) times daily as needed for muscle spasms (neck pain /spasm). 30 tablet 0   dicyclomine (BENTYL) 20 MG tablet Take 1 tablet (20 mg total) by mouth 3 (three) times daily before meals. 90 tablet 1   estradiol (ESTRACE) 0.1 MG/GM vaginal cream APPLY A PEA SIZED AMOUNT TO AFFECTED AREA TWICE A WEEK AS NEEDED 42.5 g 0   fluticasone (FLONASE) 50 MCG/ACT nasal spray Place 2 sprays into both nostrils daily. 16 g 12   pravastatin (PRAVACHOL) 20 MG tablet Take 1 tablet (20 mg total) by mouth daily. 90 tablet 3    verapamil (CALAN-SR) 240 MG CR tablet TAKE 1 TABLET EVERY DAY 90 tablet 3   No facility-administered medications prior to visit.     Per HPI unless specifically indicated in ROS section below Review of Systems Objective:  BP (!) 150/80   Pulse 92   Temp 99.2 F (37.3 C) (Oral)   Ht '5\' 5"'$  (1.651 m)   Wt 195 lb 2 oz (88.5 kg)   SpO2 96%   BMI 32.47 kg/m   Wt Readings from Last 3 Encounters:  07/26/22 195 lb 2 oz (88.5 kg)  06/28/22 196 lb (88.9 kg)  06/27/22 193 lb 4 oz (87.7 kg)      Physical Exam    Results for orders placed or performed in visit on 06/04/22  ECHOCARDIOGRAM COMPLETE  Result Value Ref Range   Area-P 1/2 3.08 cm2   S' Lateral 2.20 cm    Assessment and Plan  There are no diagnoses linked to this encounter.  No follow-ups on file.   Eliezer Lofts, MD

## 2022-08-07 ENCOUNTER — Other Ambulatory Visit: Payer: Self-pay | Admitting: Cardiology

## 2022-08-07 ENCOUNTER — Ambulatory Visit (HOSPITAL_COMMUNITY)
Admission: RE | Admit: 2022-08-07 | Discharge: 2022-08-07 | Disposition: A | Discharge status: Home / Self Care | Payer: Medicare HMO | Source: Ambulatory Visit | Attending: Cardiology | Admitting: Cardiology

## 2022-08-07 DIAGNOSIS — I517 Cardiomegaly: Secondary | ICD-10-CM

## 2022-08-07 DIAGNOSIS — I700 Atherosclerosis of aorta: Secondary | ICD-10-CM

## 2022-08-07 DIAGNOSIS — I6523 Occlusion and stenosis of bilateral carotid arteries: Secondary | ICD-10-CM | POA: Insufficient documentation

## 2022-08-07 DIAGNOSIS — R002 Palpitations: Secondary | ICD-10-CM | POA: Insufficient documentation

## 2022-08-07 DIAGNOSIS — E785 Hyperlipidemia, unspecified: Secondary | ICD-10-CM | POA: Insufficient documentation

## 2022-08-13 NOTE — Assessment & Plan Note (Signed)
Acute, likely viral upper respiratory tract infection.  Testing in office today for COVID and flu negative. Rest, fluids and time.  Call if new fever,  ear pain or face pain,  or symptoms worsening past day 5-7 of illness for consideration of antibiotics.  Start flonase 2 spray per nostril daily.  Can use nyquil as needed  If severe shortness of breath , go to ER.

## 2022-09-09 ENCOUNTER — Telehealth: Payer: Self-pay | Admitting: Family Medicine

## 2022-09-09 MED ORDER — VERAPAMIL HCL ER 240 MG PO TBCR: 240.0000 mg | EXTENDED_RELEASE_TABLET | Freq: Every day | ORAL | 2 refills | Status: DC

## 2022-09-09 NOTE — Telephone Encounter (Signed)
Prescription Request  09/09/2022  LOV: 06/07/2022  What is the name of the medication or equipment? verapamil (CALAN-SR) 240 MG CR tablet    Have you contacted your pharmacy to request a refill? No   Which pharmacy would you like this sent to?  [place], [place] - [place] [address] [place] [address] Phone: [phone] Fax: [phone]    Patient notified that their request is being sent to the clinical staff for review and that they should receive a response within 2 business days.   Please advise at Mobile [phone] (mobile)

## 2022-09-09 NOTE — Addendum Note (Signed)
Addended by: Sherrilee Gilles B on: 09/09/2022 12:24 PM   Modules accepted: Orders

## 2022-09-09 NOTE — Telephone Encounter (Signed)
Erx has been sent.

## 2022-09-19 ENCOUNTER — Encounter: Payer: Self-pay | Admitting: Family Medicine

## 2022-09-19 ENCOUNTER — Ambulatory Visit (INDEPENDENT_AMBULATORY_CARE_PROVIDER_SITE_OTHER): Payer: Medicare HMO | Admitting: Family Medicine

## 2022-09-19 VITALS — BP 130/70 | HR 69 | Temp 97.5°F | Ht 65.0 in | Wt 197.0 lb

## 2022-09-19 DIAGNOSIS — R202 Paresthesia of skin: Secondary | ICD-10-CM

## 2022-09-19 NOTE — Progress Notes (Signed)
Bentyl helped with GI sx.  Used prn.  No adverse effect on medication.  Discussed.  09/16/22, sx started after work and lasted until bedtime w/o clear trigger.  It "felt a little jackhammer in the R foot" like a tremor in the foot but not visible.  Concurrently with L eyelid twitching.  Scalp with L sided prickly feeling.  Didn't cross midline on the scalp. She had L sided lip tingling at the time.  Didn't cross midline.  Burning sensation on L side of abd wall, 1 patch on the skin on the L side, over the inferior ribs.  Had R sided abd pain that felt different from the above.  Did not have a sore throat but some L sided neck muscle soreness.    All had happened prior to 09/16/22 but previously not at the same time, ie that happens separately over the last few months.    She also has a separate roaring that is constant in the head, in the R ear.  "Like a white noise."   All sx resolved except for the R ear.  That is longstanding and not new, ie going on for years intermittently but more persistent recently.    Meds, vitals, and allergies reviewed.   ROS: Per HPI unless specifically indicated in ROS section   GEN: nad, alert and oriented HEENT: ncat, TM wnl NECK: supple w/o LA CV: rrr.  PULM: ctab, no inc wob ABD: soft, +bs EXT: no edema SKIN: no acute rash but open comedone on the L upper back.   CN 2-12 wnl B, S/S/DTR wnl x4 except for mild L lip paresthesia at the OV.  This is not a new finding, as above.  30 minutes were devoted to patient care in this encounter (this includes time spent reviewing the patient's file/history, interviewing and examining the patient, counseling/reviewing plan with patient).

## 2022-09-19 NOTE — Patient Instructions (Addendum)
Go to the lab on the way out.   If you have mychart we'll likely use that to update you.    Take care.  Glad to see you. If you have new weakness or speech changes, then dial 911.  You may need an MRI.

## 2022-09-20 LAB — COMPREHENSIVE METABOLIC PANEL
ALT: 21 U/L (ref 0–35)
AST: 20 U/L (ref 0–37)
Albumin: 4.5 g/dL (ref 3.5–5.2)
Alkaline Phosphatase: 66 U/L (ref 39–117)
BUN: 10 mg/dL (ref 6–23)
CO2: 28 mEq/L (ref 19–32)
Calcium: 10.3 mg/dL (ref 8.4–10.5)
Chloride: 103 mEq/L (ref 96–112)
Creatinine, Ser: 0.7 mg/dL (ref 0.40–1.20)
GFR: 85.74 mL/min (ref >60.00)
Glucose, Bld: 83 mg/dL (ref 70–99)
Potassium: 4.1 mEq/L (ref 3.5–5.1)
Sodium: 139 mEq/L (ref 135–145)
Total Bilirubin: 0.4 mg/dL (ref 0.2–1.2)
Total Protein: 7.8 g/dL (ref 6.0–8.3)

## 2022-09-20 LAB — CBC WITH DIFFERENTIAL/PLATELET
Basophils Absolute: 0 10*3/uL (ref 0.0–0.1)
Basophils Relative: 0.4 % (ref 0.0–3.0)
Eosinophils Absolute: 0.1 10*3/uL (ref 0.0–0.7)
Eosinophils Relative: 3.2 % (ref 0.0–5.0)
HCT: 40 % (ref 36.0–46.0)
Hemoglobin: 13.1 g/dL (ref 12.0–15.0)
Lymphocytes Relative: 36.2 % (ref 12.0–46.0)
Lymphs Abs: 1.6 10*3/uL (ref 0.7–4.0)
MCHC: 32.8 g/dL (ref 30.0–36.0)
MCV: 83.5 fl (ref 78.0–100.0)
Monocytes Absolute: 0.4 10*3/uL (ref 0.1–1.0)
Monocytes Relative: 8.3 % (ref 3.0–12.0)
Neutro Abs: 2.3 10*3/uL (ref 1.4–7.7)
Neutrophils Relative %: 51.9 % (ref 43.0–77.0)
Platelets: 207 10*3/uL (ref 150.0–400.0)
RBC: 4.78 Mil/uL (ref 3.87–5.11)
RDW: 13.6 % (ref 11.5–15.5)
WBC: 4.5 10*3/uL (ref 4.0–10.5)

## 2022-09-20 LAB — TSH: TSH: 1.05 u[IU]/mL (ref 0.35–5.50)

## 2022-09-20 LAB — VITAMIN B12: Vitamin B-12: 686 pg/mL (ref 211–911)

## 2022-09-22 NOTE — Assessment & Plan Note (Signed)
Multiple paresthesias, ongoing but intermittent, in multiple nerve distributions and on separate sides of the body.  Discussed options.  She would likely not tolerate a closed MRI and if she needs an MRI done it would likely need to be an open MRI.  I think it makes sense to check her labs first.  If she has any new weakness or new symptoms then she knows to go to the emergency room.  At this point it is not clear what is causing her symptoms but given the duration there is no reason to think that she has any emergent issue that would require emergency room evaluation right now.

## 2022-09-27 ENCOUNTER — Encounter: Payer: Self-pay | Admitting: Neurology

## 2022-09-27 ENCOUNTER — Other Ambulatory Visit: Payer: Self-pay | Admitting: Family Medicine

## 2022-09-27 DIAGNOSIS — R202 Paresthesia of skin: Secondary | ICD-10-CM

## 2022-09-30 ENCOUNTER — Encounter: Payer: Self-pay | Admitting: *Deleted

## 2022-10-18 ENCOUNTER — Ambulatory Visit: Payer: Medicare HMO | Admitting: Neurology

## 2022-10-20 ENCOUNTER — Ambulatory Visit
Admission: RE | Admit: 2022-10-20 | Discharge: 2022-10-20 | Disposition: A | Discharge status: Home / Self Care | Payer: Medicare HMO | Source: Ambulatory Visit | Attending: Family Medicine | Admitting: Family Medicine

## 2022-10-20 DIAGNOSIS — I6782 Cerebral ischemia: Secondary | ICD-10-CM | POA: Diagnosis not present

## 2022-10-20 DIAGNOSIS — R202 Paresthesia of skin: Secondary | ICD-10-CM

## 2022-10-20 DIAGNOSIS — R251 Tremor, unspecified: Secondary | ICD-10-CM | POA: Diagnosis not present

## 2022-11-15 NOTE — Progress Notes (Signed)
Initial neurology clinic note  INIOLUWA BRUMLOW MRN: [id number] DOB: Nov 17, 1950  Referring provider: Joaquim Nam, MD  Primary care provider: Joaquim Nam, MD  Reason for consult:  paresthesias  Subjective:  This is Ms. Terri Piedra, a 72 y.o. female with a medical history of hypertrophic cardiomyopathy, HLD, IBS who presents to neurology clinic with paresthesias. The patient is alone today.  Patient first noticed symptoms about 3 months ago. She first noticed her right foot felt like a "jackhammer" was in her foot (vibrating sensation). She also has had "warm chills" going through both legs and sometimes her head. She felt the left side of her face (right down the middle) was tingling. She also had left sided neck pain and burning on the left abdominal wall. She feels itching/prickling in her body, especially the arms as well. She noticed muscle spasms in her left eyelid. She feels soreness in the front of her left neck without difficulty swallowing.  On 09/27/22, patient was talking and laughed. Her peripheral vision closed in and felt like she was going to faint. She took a deep breath and this resolved. She mentions that this occurs every once in a while.  She has not had any of these symptoms over the last month. She does not think anything has significantly changes. She does not remember any illness prior to symptoms. Her symptoms are not interfering with her life currently.   She denies headaches. She endorses a more constant white noise (roar) in her head, for many years, especially when she is going on long drives. This resolves with tylenol.  Of note, patient is on B complex.  Patient denies recent fevers, night sweats, or unintentional weight loss.  EtOH use: None Uses marijuana nightly to sleep (smokes) - started 2 weeks ago (symptoms were prior) Restrictive diet: No  MEDICATIONS:  Outpatient Encounter Medications as of 11/22/2022  Medication Sig   b complex  vitamins tablet Take 1 tablet by mouth daily.   Black Currant Seed Oil 500 MG CAPS Take 1 capsule by mouth daily.   cholecalciferol (VITAMIN D3) 25 MCG (1000 UNIT) tablet Take 1,000 Units by mouth daily.   Coenzyme Q10 (CO Q 10 PO) Take by mouth.   dicyclomine (BENTYL) 20 MG tablet Take 1 tablet (20 mg total) by mouth 3 (three) times daily before meals.   estradiol (ESTRACE) 0.1 MG/GM vaginal cream APPLY A PEA SIZED AMOUNT TO AFFECTED AREA TWICE A WEEK AS NEEDED   fluticasone (FLONASE) 50 MCG/ACT nasal spray Place 2 sprays into both nostrils daily.   loratadine (CLARITIN) 10 MG tablet Take 10 mg by mouth daily.   omega-3 acid ethyl esters (LOVAZA) 1 g capsule Take by mouth 2 (two) times daily.   pravastatin (PRAVACHOL) 20 MG tablet Take 1 tablet (20 mg total) by mouth daily.   verapamil (CALAN-SR) 240 MG CR tablet Take 1 tablet (240 mg total) by mouth daily.   cyclobenzaprine (FLEXERIL) 10 MG tablet Take 0.5-1 tablets (5-10 mg total) by mouth 3 (three) times daily as needed for muscle spasms (neck pain /spasm). (Patient not taking: Reported on 11/22/2022)   No facility-administered encounter medications on file as of 11/22/2022.    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   Hyperlipidemia    Hypertrophic cardiomyopathy (HCC)    Palpitations    Trigger finger of left hand 09/2012   left long finger    PAST SURGICAL HISTORY: Past Surgical History:  Procedure Laterality Date   BREAST REDUCTION SURGERY Bilateral 2001   CHOLECYSTECTOMY  07/17/2011   Procedure: LAPAROSCOPIC CHOLECYSTECTOMY WITH INTRAOPERATIVE CHOLANGIOGRAM;  Surgeon: Currie Paris, MD;  Location: [facility] OR;  Service: General;  Laterality: N/A;   COLONOSCOPY     FOOT GANGLION EXCISION Right    HEMORRHOID SURGERY  1980   LACERATION  REPAIR Right 1996   thumb   MYOMECTOMY  10/09/99   x 2   OOPHORECTOMY Right 1980   OOPHORECTOMY Left 2000   OVARIAN CYST DRAINAGE  1980   PARTIAL HYSTERECTOMY  2002   with no remaining ovary   POLYPECTOMY     TRIGGER FINGER RELEASE Left 09/28/2012   Procedure: RELEASE TRIGGER FINGER/A-1 PULLEY;  Surgeon: Jodi Marble, MD;  Location: [facility];  Service: Orthopedics;  Laterality: Left;   UPPER GASTROINTESTINAL ENDOSCOPY      ALLERGIES: Allergies  Allergen Reactions   Morphine Sulfate Swelling   Penicillins Swelling    FAMILY HISTORY: Family History  Problem Relation Age of Onset   Colon cancer Father 55   Diabetes Sister    Hepatitis C Brother    Hypertension Brother    Hepatitis C Brother    Colon cancer Paternal Aunt        dx'd in her 54's    Breast cancer Paternal Aunt        two aunts   Lung cancer Paternal Uncle    Breast cancer Cousin    Liver cancer Cousin    Colon cancer Other        cousin    Colon polyps Neg Hx    Esophageal cancer Neg Hx    Rectal cancer Neg Hx    Stomach cancer Neg Hx     SOCIAL HISTORY: Social History   Tobacco Use   Smoking status: Former    Packs/day: 2.50    Years: 42.00    Additional pack years: 0.00    Total pack years: 105.00    Types: Cigarettes    Quit date: 07/07/2005    Years since quitting: 17.3   Smokeless tobacco: Never   Tobacco comments:    quit smoking 2008  Vaping Use   Vaping Use: Never used  Substance Use Topics   Alcohol use: Not Currently    Comment: rarely   Drug use: Yes    Frequency: 7.0 times per week    Types: Marijuana    Comment: previous hx. of   Social History   Social History Narrative   Divorced 1982   2 children   Left handed      Are you currently employed ?    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    What type of home do you live in: 1 story or 2 story? two   Caffeine no     Objective:  Vital Signs:   BP (!) 141/74   Pulse 72   Ht 5\' 5"  (1.651 m)   Wt 196 lb (88.9 kg)   SpO2 98%   BMI 32.62 kg/m   General: No acute distress.  Patient appears well-groomed.   Head:  Normocephalic/atraumatic Neck: supple, no paraspinal tenderness, full range of  motion Extremities: No peripheral edema, mild arthritic deformities of the hand  Neurological Exam: Mental status: alert and oriented, speech fluent and not dysarthric, language intact.  Cranial nerves: CN I: not tested CN II: pupils equal, round and reactive to light, visual fields intact CN III, IV, VI:  full range of motion, no nystagmus, no ptosis CN V: facial sensation intact. CN VII: upper and lower face symmetric CN VIII: hearing intact CN IX, X: uvula midline CN XI: sternocleidomastoid and trapezius muscles intact CN XII: tongue midline  Bulk & Tone: normal, no fasciculations. Motor:  muscle strength 5/5 throughout Deep Tendon Reflexes:  2+ throughout,  toes downgoing.   Sensation:  Pinprick, vibratory, and proprioception sensation intact. Finger to nose testing:  Without dysmetria.   Gait:  Normal station and stride.  Romberg negative.   Labs and Imaging review: Internal labs: No results found for: "HGBA1C" Lab Results  Component Value Date   VITAMINB12 686 09/19/2022   Lab Results  Component Value Date   TSH 1.05 09/19/2022   Lab Results  Component Value Date   ESRSEDRATE 10 02/04/2021   09/19/22: CBC wnl CMP wnl  Imaging: MRI brain wo contrast (10/20/22): FINDINGS: Brain: Negative for an acute infarct. No hydrocephalus. No hemorrhage. No extra-axial fluid collection. No mass lesion. Sequela of mild chronic microvascular ischemic change. Cavum septum pellucidum.   Vascular: Normal flow voids.   Skull and upper cervical spine: Normal marrow signal.   Sinuses/Orbits: No middle ear or mastoid effusion. Paranasal sinuses are clear. Orbits are unremarkable.   Other: None   IMPRESSION: No acute  intracranial process.  CT head and CTA head and neck (02/04/21): FINDINGS: CT HEAD FINDINGS   Brain:   Mild generalized cerebral and cerebellar atrophy.   There is no acute intracranial hemorrhage.   No demarcated cortical infarct.   No extra-axial fluid collection.   No evidence of an intracranial mass.   No midline shift.   Partially empty sella turcica.   Vascular: No hyperdense vessel.   Skull: No calvarial fracture or focal suspicious calvarial lesion.   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   Orbits: No acute or significant finding.   Review of the MIP images confirms the above findings   CTA NECK FINDINGS   Aortic arch: Common origin of the innominate and left common carotid arteries. Atherosclerotic plaque within the visualized aortic arch and proximal major branch vessels of the neck. No hemodynamically significant innominate or proximal subclavian artery stenosis.   Right carotid system: CCA and ICA patent within the neck without stenosis. Mild soft and calcified plaque within the carotid bifurcation and proximal ICA.   Left carotid system: CCA and ICA patent within the neck. Soft and calcified plaque within the carotid bifurcation and proximal ICA results in less than 50% stenosis at the origin of the left ICA.   Vertebral arteries: Streak and beam hardening artifact arising from a dense right-sided contrast bolus partially obscures the V1 right vertebral artery. Within this limitation, the vertebral arteries are codominant and patent within the neck without appreciable stenosis.   Skeleton: Cervical spondylosis. No acute bony abnormality or aggressive osseous lesion.   Other neck: No neck mass or cervical lymphadenopathy.   Upper chest: No consolidation within the imaged lung apices.   Review of the MIP images confirms the above findings   CTA HEAD FINDINGS   Anterior circulation:   The intracranial internal carotid  arteries are patent. Atherosclerotic plaque within both vessels with no  more than mild stenosis. The M1 middle cerebral arteries are patent. No M2 proximal branch occlusion or high-grade proximal stenosis is identified. The anterior cerebral arteries are patent. No intracranial aneurysm is identified.   Posterior circulation:   The intracranial vertebral arteries are patent. The basilar artery is patent. The posterior cerebral arteries are patent. Posterior communicating arteries are hypoplastic or absent bilaterally.   Venous sinuses: Within the limitations of contrast timing, no convincing thrombus.   Anatomic variants: As described   Review of the MIP images confirms the above findings   IMPRESSION: CT head:   1. No evidence of acute intracranial abnormality. 2. Mild generalized parenchymal atrophy.   CTA neck:   1. The common carotid and internal carotid arteries are patent within the neck. Atherosclerotic plaque within the bilateral carotid systems within the neck, as described. Soft and calcified plaque within the left carotid bifurcation/proximal left ICA results in less than 50% stenosis at the origin of the left ICA. 2. Streak and beam hardening artifact arising from a dense right-sided contrast bolus partially obscures the V1 right vertebral artery. Within this limitation, the vertebral arteries are patent within the neck without appreciable stenosis.   CTA head:   1. No intracranial large vessel occlusion or proximal high-grade arterial stenosis. 2. Atherosclerotic plaque within the intracranial internal carotid arteries bilaterally with no more than mild stenosis.  Assessment/Plan:  LUDMILA BYER is a 72 y.o. female who presents for evaluation of intermittent paresthesias of face, arms, abdomen, and legs. She has a relevant medical history of hypertrophic cardiomyopathy, HLD, IBS. Her neurological examination is essentially normal today. Available  diagnostic data is significant for normal MRI brain on 10/20/22, normal B12. The etiology of patient's symptoms is currently unclear. Luckily, the symptoms do not interfere with her daily activities and have not occurred in the last month. Given that symptoms involve the face, the brain is the obvious localization, if neurologic, which was normal. I will check labs to look for treatable causes, and patient will monitor symptoms.  PLAN: -Blood work: B1, B6, vit D -Consider stopping the B complex -Discussed marijuana use. Despite this starting after symptom onset, I expressed concern for not always knowing what she is putting in her body and the potential for adverse reactions. -Patient will monitor symptoms and reach out with new or worsening symptoms   -Return to clinic as needed  The impression above as well as the plan as outlined below were extensively discussed with the patient who voiced understanding. All questions were answered to their satisfaction.  When available, results of the above investigations and possible further recommendations will be communicated to the patient via telephone/MyChart. Patient to call office if not contacted after expected testing turnaround time.   Total time spent reviewing records, interview, history/exam, documentation, and coordination of care on day of encounter:  35 min   Thank you for allowing me to participate in patient's care.  If I can answer any additional questions, I would be pleased to do so.  Jacquelyne Balint, MD   CC: Joaquim Nam, MD [address] [place] [address]  CC: Referring provider: Joaquim Nam, MD [address] [place],  [place] [address]

## 2022-11-22 ENCOUNTER — Ambulatory Visit: Payer: Medicare HMO | Admitting: Neurology

## 2022-11-22 ENCOUNTER — Other Ambulatory Visit: Payer: Medicare HMO

## 2022-11-22 ENCOUNTER — Encounter: Payer: Self-pay | Admitting: Neurology

## 2022-11-22 VITALS — BP 141/74 | HR 72 | Ht 65.0 in | Wt 196.0 lb

## 2022-11-22 DIAGNOSIS — R209 Unspecified disturbances of skin sensation: Secondary | ICD-10-CM | POA: Diagnosis not present

## 2022-11-22 NOTE — Patient Instructions (Signed)
I am not sure the cause of your symptoms. Your MRI brain and normal neurologic exam are reassuring. I would like to do some blood work today. I will be in touch when I have the results.  Consider stopping the B complex as this can cause odd paresthesias sometimes.  We discussed marijuana use and the concern of not always knowing what is in this.  Monitor your symptoms and if you have new or worsening symptoms, let me know.  The physicians and staff at [facility] are committed to providing excellent care. You may receive a survey requesting feedback about your experience at our office. We strive to receive "very good" responses to the survey questions. If you feel that your experience would prevent you from giving the office a "very good " response, please contact our office to try to remedy the situation. We may be reached at [phone]. Thank you for taking the time out of your busy day to complete the survey.  Jacquelyne Balint, MD [facility]

## 2022-11-27 ENCOUNTER — Other Ambulatory Visit (INDEPENDENT_AMBULATORY_CARE_PROVIDER_SITE_OTHER): Payer: Medicare HMO

## 2022-11-27 DIAGNOSIS — R209 Unspecified disturbances of skin sensation: Secondary | ICD-10-CM | POA: Diagnosis not present

## 2022-11-27 LAB — VITAMIN D 25 HYDROXY (VIT D DEFICIENCY, FRACTURES): VITD: 50.13 ng/mL (ref 30.00–100.00)

## 2022-11-30 LAB — VITAMIN B6: Vitamin B6: 14.9 ng/mL (ref 2.1–21.7)

## 2022-11-30 LAB — VITAMIN B1: Vitamin B1 (Thiamine): 18 nmol/L (ref 8–30)

## 2022-12-06 NOTE — Progress Notes (Signed)
Phone number is wrong, will send my chart message

## 2022-12-06 NOTE — Progress Notes (Signed)
Phone number is not correct.

## 2022-12-17 NOTE — Progress Notes (Signed)
Cardiology Office Note:   Date:  12/23/2022  ID:  [name], DOB Jun 08, 1951, MRN [id number] PCP: Joaquim Nam, MD  [facility] Providers Cardiologist:  Rollene Rotunda, MD    History of Present Illness:   [name] is a 72 y.o. female who presents for evaluation of mild ventricular hypertrophy. She's also had palpitations. An echo in 2010 suggested moderate mitral regurgitation directed posteriorly. There was LVH. This was severe. She is also had some aortic insufficiency. However followup echocardiography in 2013 suggested only mild mitral regurgitation and no significant ventricular hypertrophy or aortic insufficiency.    Her echo in Nov 2023 demonstrated mild LVH with a mild intracavitary gradient.     At the last visit she had dizziness.    She said that this is really just feeling like her peripheral vision is closing and occasionally in physical brain with and she gets over fairly quickly if she deep breathes.  She is not describing palpitations and she has not had any syncope. The patient denies any new symptoms such as chest discomfort, neck or arm discomfort. There has been no new shortness of breath, PND or orthopnea. There have been no reported palpitations, presyncope or syncope.    ROS: As stated in the HPI and negative for all other systems.  Studies Reviewed:    EKG:  NA    Risk Assessment/Calculations:     Physical Exam:   VS:  BP 138/82 (BP Location: Left Arm, Patient Position: Sitting, Cuff Size: Normal)   Pulse 65   Ht 5\' 5"  (1.651 m)   Wt 195 lb 3.2 oz (88.5 kg)   SpO2 95%   BMI 32.48 kg/m    Wt Readings from Last 3 Encounters:  12/23/22 195 lb 3.2 oz (88.5 kg)  11/22/22 196 lb (88.9 kg)  09/19/22 197 lb (89.4 kg)     GEN: Well nourished, well developed in no acute distress NECK: No JVD; No carotid bruits CARDIAC: RRR, no murmurs, rubs, gallops RESPIRATORY:  Clear to auscultation without rales, wheezing or rhonchi  ABDOMEN: Soft,  non-tender, non-distended EXTREMITIES:  No edema; No deformity   ASSESSMENT AND PLAN:   DIZZINESS:   This seems to be more of a vision issue.  She is checked her blood pressure before when she has had this has not been an issue.  Does not seem to be tachypalpitations.  She has not had any syncope.  No further cardiac workup.    HTN:   Blood pressures is upper limits but has not typically been in she will keep an eye on this.  PALPITATIONS:   She had a monitor in 2021 she has had no increasing symptoms.  No change in therapy.  No further testing.    DYSLIPIDEMIA:   Her last LDL was over a year ago and was 150.  Her total cholesterol was elevated in 02/25/2022 at 188.  Triglycerides were elevated.  I talked to her about low carbohydrate diet.  She is already on fish oil.  I am going to switch her from pravastatin to Crestor 20 mg daily and check a lipid profile in about 3 months.    AORTIC ATHEROSCLEROSIS: She will pursue risk reduction.   LVH:    This was mild.  No further workup.  ABNORMAL EKG:   She has had stable appearing EKGs on the previous serial EKGs.  Changes were probably related to mild LVH which was more severe previously.    Elevated coronary calcium: The  patient has no symptoms.  She does have calcium noted on her CTs.  She needs aggressive risk reduction and thus no change to statin.     Signed, Rollene Rotunda, MD

## 2022-12-23 ENCOUNTER — Encounter: Payer: Self-pay | Admitting: Cardiology

## 2022-12-23 ENCOUNTER — Ambulatory Visit: Payer: Medicare HMO | Attending: Cardiology | Admitting: Cardiology

## 2022-12-23 VITALS — BP 138/82 | HR 65 | Ht 65.0 in | Wt 195.2 lb

## 2022-12-23 DIAGNOSIS — R002 Palpitations: Secondary | ICD-10-CM | POA: Diagnosis not present

## 2022-12-23 DIAGNOSIS — E785 Hyperlipidemia, unspecified: Secondary | ICD-10-CM | POA: Diagnosis not present

## 2022-12-23 DIAGNOSIS — I517 Cardiomegaly: Secondary | ICD-10-CM | POA: Diagnosis not present

## 2022-12-23 DIAGNOSIS — I700 Atherosclerosis of aorta: Secondary | ICD-10-CM | POA: Diagnosis not present

## 2022-12-23 DIAGNOSIS — R42 Dizziness and giddiness: Secondary | ICD-10-CM | POA: Diagnosis not present

## 2022-12-23 MED ORDER — ROSUVASTATIN CALCIUM 20 MG PO TABS: 20.0000 mg | ORAL_TABLET | Freq: Every day | ORAL | 3 refills | Status: DC

## 2022-12-23 NOTE — Patient Instructions (Signed)
Medication Instructions:   STOP PRAVASTATIN  START ROSUVASTATIN 20 MG ONCE DAILY  *If you need a refill on your cardiac medications before your next appointment, please call your pharmacy*   Lab Work:  Your physician recommends that you return for lab work in: 3 MONTHS-FASTING  If you have labs (blood work) drawn today and your tests are completely normal, you will receive your results only by: MyChart Message (if you have MyChart) OR A paper copy in the mail If you have any lab test that is abnormal or we need to change your treatment, we will call you to review the results.   Follow-Up: At [facility], you and your health needs are our priority.  As part of our continuing mission to provide you with exceptional heart care, we have created designated Provider Care Teams.  These Care Teams include your primary Cardiologist (physician) and Advanced Practice Providers (APPs -  Physician Assistants and Nurse Practitioners) who all work together to provide you with the care you need, when you need it.  We recommend signing up for the patient portal called "MyChart".  Sign up information is provided on this After Visit Summary.  MyChart is used to connect with patients for Virtual Visits (Telemedicine).  Patients are able to view lab/test results, encounter notes, upcoming appointments, etc.  Non-urgent messages can be sent to your provider as well.   To learn more about what you can do with MyChart, go to [email or web address].    Your next appointment:   12 month(s)  Provider:   Rollene Rotunda, MD

## 2023-01-21 ENCOUNTER — Ambulatory Visit: Payer: Medicare HMO

## 2023-01-21 VITALS — Ht 65.0 in | Wt 192.0 lb

## 2023-01-21 DIAGNOSIS — Z0000 Encounter for general adult medical examination without abnormal findings: Secondary | ICD-10-CM | POA: Diagnosis not present

## 2023-01-21 DIAGNOSIS — Z1231 Encounter for screening mammogram for malignant neoplasm of breast: Secondary | ICD-10-CM | POA: Diagnosis not present

## 2023-01-21 NOTE — Patient Instructions (Addendum)
[name] , Thank you for taking time to come for your Medicare Wellness Visit. I appreciate your ongoing commitment to your health goals. Please review the following plan we discussed and let me know if I can assist you in the future.   These are the goals we discussed:  Goals      Patient Stated     02/15/2020, I will continue to ride my treadmill everyday for 30 minutes.      Patient Stated     01/31/2022, wants to lose 20 pounds     Patient Stated     Lose 20 pounds.        This is a list of the screening recommended for you and due dates:  Health Maintenance  Topic Date Due   Zoster (Shingles) Vaccine (1 of 2) 03/16/1999   Pneumonia Vaccine (3 of 3 - PPSV23 or PCV20) 06/05/2020   COVID-19 Vaccine (6 - 2023-24 season) 09/12/2022   Medicare Annual Wellness Visit  02/01/2023   DEXA scan (bone density measurement)  02/15/2024*   Flu Shot  02/06/2023   Mammogram  06/28/2024   DTaP/Tdap/Td vaccine (3 - Td or Tdap) 09/05/2024   Colon Cancer Screening  08/24/2025   Hepatitis C Screening  Completed   HPV Vaccine  Aged Out  *Topic was postponed. The date shown is not the original due date.   You have an order for:  []   2D Mammogram  [x]   3D Mammogram  []   Bone Density     Please call for appointment:  [facility] [address] [place], [place] [address] [phone]  [facility] [address] [place], [place] [address] [phone]  [facility] [address] [place], [place] [address] [phone]  [facility] [address] [place], [place] [address] [phone]  [facility] [address] [place], [place] [address] [phone]    Make sure to wear two-piece clothing.  No lotions, powders, or deodorants the day of the appointment. Make sure to bring picture ID and insurance card.  Bring list of medications you are  currently taking including any supplements.   Schedule your [place] screening mammogram through MyChart!   Log into your MyChart account.  Go to 'Visit' (or 'Appointments' if on mobile App) --> Schedule an Appointment  Under 'Select a Reason for Visit' choose the Mammogram Screening option.  Complete the pre-visit questions and select the time and place that best fits your schedule.    Advanced directives: Advance directive discussed with you today. Even though you declined this today, please call our office should you change your mind, and we can give you the proper paperwork for you to fill out.   Conditions/risks identified: Aim for 30 minutes of exercise or brisk walking, 6-8 glasses of water, and 5 servings of fruits and vegetables each day.   Next appointment: Follow up in one year for your annual wellness visit 01/22/24 @ 10:15 televisit   Preventive Care 65 Years and Older, Female Preventive care refers to lifestyle choices and visits with your health care provider that can promote health and wellness. What does preventive care include? A yearly physical exam. This is also called an annual well check. Dental exams once or twice a year. Routine eye exams. Ask your health care provider how often you should have your eyes checked. Personal lifestyle choices, including: Daily care of your  teeth and gums. Regular physical activity. Eating a healthy diet. Avoiding tobacco and drug use. Limiting alcohol use. Practicing safe sex. Taking low-dose aspirin every day. Taking vitamin and mineral supplements as recommended by your health care provider. What happens during an annual well check? The services and screenings done by your health care provider during your annual well check will depend on your age, overall health, lifestyle risk factors, and family history of disease. Counseling  Your health care provider may ask you questions about your: Alcohol use. Tobacco  use. Drug use. Emotional well-being. Home and relationship well-being. Sexual activity. Eating habits. History of falls. Memory and ability to understand (cognition). Work and work Astronomer. Reproductive health. Screening  You may have the following tests or measurements: Height, weight, and BMI. Blood pressure. Lipid and cholesterol levels. These may be checked every 5 years, or more frequently if you are over 65 years old. Skin check. Lung cancer screening. You may have this screening every year starting at age 82 if you have a 30-pack-year history of smoking and currently smoke or have quit within the past 15 years. Fecal occult blood test (FOBT) of the stool. You may have this test every year starting at age 87. Flexible sigmoidoscopy or colonoscopy. You may have a sigmoidoscopy every 5 years or a colonoscopy every 10 years starting at age 43. Hepatitis C blood test. Hepatitis B blood test. Sexually transmitted disease (STD) testing. Diabetes screening. This is done by checking your blood sugar (glucose) after you have not eaten for a while (fasting). You may have this done every 1-3 years. Bone density scan. This is done to screen for osteoporosis. You may have this done starting at age 29. Mammogram. This may be done every 1-2 years. Talk to your health care provider about how often you should have regular mammograms. Talk with your health care provider about your test results, treatment options, and if necessary, the need for more tests. Vaccines  Your health care provider may recommend certain vaccines, such as: Influenza vaccine. This is recommended every year. Tetanus, diphtheria, and acellular pertussis (Tdap, Td) vaccine. You may need a Td booster every 10 years. Zoster vaccine. You may need this after age 45. Pneumococcal 13-valent conjugate (PCV13) vaccine. One dose is recommended after age 32. Pneumococcal polysaccharide (PPSV23) vaccine. One dose is recommended  after age 74. Talk to your health care provider about which screenings and vaccines you need and how often you need them. This information is not intended to replace advice given to you by your health care provider. Make sure you discuss any questions you have with your health care provider. Document Released: 07/21/2015 Document Revised: 03/13/2016 Document Reviewed: 04/25/2015 Elsevier Interactive Patient Education  2017 [employer].  Fall Prevention in the Home Falls can cause injuries. They can happen to people of all ages. There are many things you can do to make your home safe and to help prevent falls. What can I do on the outside of my home? Regularly fix the edges of walkways and driveways and fix any cracks. Remove anything that might make you trip as you walk through a door, such as a raised step or threshold. Trim any bushes or trees on the path to your home. Use bright outdoor lighting. Clear any walking paths of anything that might make someone trip, such as rocks or tools. Regularly check to see if handrails are loose or broken. Make sure that both sides of any steps have handrails. Any raised decks and  porches should have guardrails on the edges. Have any leaves, snow, or ice cleared regularly. Use sand or salt on walking paths during winter. Clean up any spills in your garage right away. This includes oil or grease spills. What can I do in the bathroom? Use night lights. Install grab bars by the toilet and in the tub and shower. Do not use towel bars as grab bars. Use non-skid mats or decals in the tub or shower. If you need to sit down in the shower, use a plastic, non-slip stool. Keep the floor dry. Clean up any water that spills on the floor as soon as it happens. Remove soap buildup in the tub or shower regularly. Attach bath mats securely with double-sided non-slip rug tape. Do not have throw rugs and other things on the floor that can make you trip. What can I do  in the bedroom? Use night lights. Make sure that you have a light by your bed that is easy to reach. Do not use any sheets or blankets that are too big for your bed. They should not hang down onto the floor. Have a firm chair that has side arms. You can use this for support while you get dressed. Do not have throw rugs and other things on the floor that can make you trip. What can I do in the kitchen? Clean up any spills right away. Avoid walking on wet floors. Keep items that you use a lot in easy-to-reach places. If you need to reach something above you, use a strong step stool that has a grab bar. Keep electrical cords out of the way. Do not use floor polish or wax that makes floors slippery. If you must use wax, use non-skid floor wax. Do not have throw rugs and other things on the floor that can make you trip. What can I do with my stairs? Do not leave any items on the stairs. Make sure that there are handrails on both sides of the stairs and use them. Fix handrails that are broken or loose. Make sure that handrails are as long as the stairways. Check any carpeting to make sure that it is firmly attached to the stairs. Fix any carpet that is loose or worn. Avoid having throw rugs at the top or bottom of the stairs. If you do have throw rugs, attach them to the floor with carpet tape. Make sure that you have a light switch at the top of the stairs and the bottom of the stairs. If you do not have them, ask someone to add them for you. What else can I do to help prevent falls? Wear shoes that: Do not have high heels. Have rubber bottoms. Are comfortable and fit you well. Are closed at the toe. Do not wear sandals. If you use a stepladder: Make sure that it is fully opened. Do not climb a closed stepladder. Make sure that both sides of the stepladder are locked into place. Ask someone to hold it for you, if possible. Clearly mark and make sure that you can see: Any grab bars or  handrails. First and last steps. Where the edge of each step is. Use tools that help you move around (mobility aids) if they are needed. These include: Canes. Walkers. Scooters. Crutches. Turn on the lights when you go into a dark area. Replace any light bulbs as soon as they burn out. Set up your furniture so you have a clear path. Avoid moving your furniture around. If  any of your floors are uneven, fix them. If there are any pets around you, be aware of where they are. Review your medicines with your doctor. Some medicines can make you feel dizzy. This can increase your chance of falling. Ask your doctor what other things that you can do to help prevent falls. This information is not intended to replace advice given to you by your health care provider. Make sure you discuss any questions you have with your health care provider. Document Released: 04/20/2009 Document Revised: 11/30/2015 Document Reviewed: 07/29/2014 Elsevier Interactive Patient Education  2017 [employer].

## 2023-01-21 NOTE — Progress Notes (Signed)
Subjective:   [name] is a 72 y.o. female who presents for Medicare Annual (Subsequent) preventive examination.  Visit Complete: Virtual  I connected with  [name] on 01/21/23 by a audio enabled telemedicine application and verified that I am speaking with the correct person using two identifiers.  Patient Location: Home  Provider Location: Home Office  I discussed the limitations of evaluation and management by telemedicine. The patient expressed understanding and agreed to proceed.  Per patient no change in vitals since last visit, unable to obtain new vitals due to telehealth visit   Review of Systems      Cardiac Risk Factors include: advanced age (>71men, >35 women);dyslipidemia;sedentary lifestyle     Objective:    Today's Vitals   01/21/23 0912  Weight: 192 lb (87.1 kg)  Height: 5\' 5"  (1.651 m)   Body mass index is 31.95 kg/m.     01/21/2023    9:20 AM 11/22/2022    7:44 AM 01/31/2022    4:14 PM 02/04/2021    9:24 AM 02/15/2020    3:43 PM 02/15/2020    8:58 AM 04/04/2019    3:44 PM  Advanced Directives  Does Patient Have a Medical Advance Directive? No No No No No No No  Would patient like information on creating a medical advance directive? No - Patient declined   No - Patient declined No - Patient declined No - Patient declined     Current Medications (verified) Outpatient Encounter Medications as of 01/21/2023  Medication Sig   Black Currant Seed Oil 500 MG CAPS Take 1 capsule by mouth daily.   cholecalciferol (VITAMIN D3) 25 MCG (1000 UNIT) tablet Take 1,000 Units by mouth daily.   Coenzyme Q10 (CO Q 10 PO) Take by mouth.   dicyclomine (BENTYL) 20 MG tablet Take 1 tablet (20 mg total) by mouth 3 (three) times daily before meals.   estradiol (ESTRACE) 0.1 MG/GM vaginal cream APPLY A PEA SIZED AMOUNT TO AFFECTED AREA TWICE A WEEK AS NEEDED   fluticasone (FLONASE) 50 MCG/ACT nasal spray Place 2 sprays into both nostrils daily.   loratadine  (CLARITIN) 10 MG tablet Take 10 mg by mouth daily.   omega-3 acid ethyl esters (LOVAZA) 1 g capsule Take by mouth 2 (two) times daily.   rosuvastatin (CRESTOR) 20 MG tablet Take 1 tablet (20 mg total) by mouth daily.   verapamil (CALAN-SR) 240 MG CR tablet Take 1 tablet (240 mg total) by mouth daily.   b complex vitamins tablet Take 1 tablet by mouth daily. (Patient not taking: Reported on 01/21/2023)   cyclobenzaprine (FLEXERIL) 10 MG tablet Take 0.5-1 tablets (5-10 mg total) by mouth 3 (three) times daily as needed for muscle spasms (neck pain /spasm). (Patient not taking: Reported on 01/21/2023)   No facility-administered encounter medications on file as of 01/21/2023.    Allergies (verified) Morphine sulfate and Penicillins   History: Past Medical History:  Diagnosis Date   Allergy    Anemia    age 74    Diverticulosis    GERD (gastroesophageal reflux disease)    occasional; OTC as needed   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   Hyperlipidemia    Hypertrophic cardiomyopathy (HCC)    Palpitations    Trigger finger of left hand 09/2012   left long finger   Past Surgical History:  Procedure Laterality Date   BREAST REDUCTION SURGERY Bilateral 2001   CHOLECYSTECTOMY  07/17/2011   Procedure:  LAPAROSCOPIC CHOLECYSTECTOMY WITH INTRAOPERATIVE CHOLANGIOGRAM;  Surgeon: Currie Paris, MD;  Location: [facility];  Service: General;  Laterality: N/A;   COLONOSCOPY     FOOT GANGLION EXCISION Right    HEMORRHOID SURGERY  1980   LACERATION REPAIR Right 1996   thumb   MYOMECTOMY  10/09/99   x 2   OOPHORECTOMY Right 1980   OOPHORECTOMY Left 2000   OVARIAN CYST DRAINAGE  1980   PARTIAL HYSTERECTOMY  2002   with no remaining ovary   POLYPECTOMY     TRIGGER FINGER RELEASE Left 09/28/2012   Procedure: RELEASE TRIGGER FINGER/A-1 PULLEY;  Surgeon: Jodi Marble, MD;  Location: [facility];  Service: Orthopedics;  Laterality: Left;   UPPER GASTROINTESTINAL ENDOSCOPY      Family History  Problem Relation Age of Onset   Colon cancer Father 73   Diabetes Sister    Hepatitis C Brother    Hypertension Brother    Hepatitis C Brother    Colon cancer Paternal Aunt        dx'd in her 64's    Breast cancer Paternal Aunt        two aunts   Lung cancer Paternal Uncle    Breast cancer Cousin    Liver cancer Cousin    Colon cancer Other        cousin    Colon polyps Neg Hx    Esophageal cancer Neg Hx    Rectal cancer Neg Hx    Stomach cancer Neg Hx    Social History   Socioeconomic History   Marital status: Divorced    Spouse name: Not on file   Number of children: 2   Years of education: Not on file   Highest education level: Not on file  Occupational History   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 of   Sexual activity: Yes    Birth control/protection: Surgical  Other Topics Concern   Not on file  Social History Narrative   Divorced 1982   2 children   Left handed      Are you currently employed ?    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    What type of home do you live in: 1 story or 2 story? two   Caffeine no    Social Determinants of Health   Financial Resource Strain: Low Risk  (01/21/2023)   Overall Financial Resource Strain (CARDIA)    Difficulty of Paying Living Expenses: Not hard at all  Food Insecurity: No Food Insecurity (01/21/2023)   Hunger Vital Sign     Worried About Running Out of Food in the Last Year: Never true    Ran Out of Food in the Last Year: Never true  Transportation Needs:  No Transportation Needs (01/21/2023)   PRAPARE - Administrator, Civil Service (Medical): No    Lack of Transportation (Non-Medical): No  Physical Activity: Inactive (01/21/2023)   Exercise Vital Sign    Days of Exercise per Week: 0 days    Minutes of Exercise per Session: 0 min  Stress: No Stress Concern Present (01/21/2023)   [employer] of Occupational Health - Occupational Stress Questionnaire    Feeling of Stress : Not at all  Social Connections: Socially Isolated (01/21/2023)   Social Connection and Isolation Panel [NHANES]    Frequency of Communication with Friends and Family: More than three times a week    Frequency of Social Gatherings with Friends and Family: More than three times a week    Attends Religious Services: Never    Database administrator or Organizations: No    Attends Engineer, structural: Never    Marital Status: Divorced    Tobacco Counseling Counseling given: Not Answered Tobacco comments: quit smoking 2008   Clinical Intake:  Pre-visit preparation completed: Yes  Pain : No/denies pain     BMI - recorded: 31.95 Nutritional Status: BMI > 30  Obese Nutritional Risks: None Diabetes: No  How often do you need to have someone help you when you read instructions, pamphlets, or other written materials from your doctor or pharmacy?: 1 - Never  Interpreter Needed?: No  Information entered by :: C.Illeana Edick LPN   Activities of Daily Living    01/21/2023    9:22 AM 01/31/2022    4:15 PM  In your present state of health, do you have any difficulty performing the following activities:  Hearing? 0 0  Vision? 0 0  Difficulty concentrating or making decisions? 1 0  Comment occasionally forgets   Walking or climbing stairs? 0 0  Dressing or bathing? 0 0  Doing errands, shopping? 0 0  Preparing  Food and eating ? N N  Using the Toilet? N N  In the past six months, have you accidently leaked urine? N N  Do you have problems with loss of bowel control? N N  Managing your Medications? N N  Managing your Finances? N N  Housekeeping or managing your Housekeeping? N N    Patient Care Team: Joaquim Nam, MD as PCP - General (Family Medicine) Rollene Rotunda, MD as PCP - Cardiology (Cardiology) Hart Carwin, MD (Inactive) as Consulting Physician (Gastroenterology) Brooke Dare, MD as Consulting Physician (Gastroenterology) Otho Ket, RN as [facility] Care Management  Indicate any recent Medical Services you may have received from other than [facility] providers in the past year (date may be approximate).     Assessment:   This is a routine wellness examination for [name].  Hearing/Vision screen Hearing Screening - Comments:: Denies hearing difficulties   Vision Screening - Comments:: Glasses - [facility] - UTD on eye exams   Dietary issues and exercise activities discussed:     Goals Addressed             This Visit's Progress    Patient Stated   Not on track    01/31/2022, wants to lose 20 pounds     Patient Stated       Lose 20 pounds.       Depression Screen    01/21/2023    9:20 AM 09/19/2022    2:10 PM 01/31/2022    4:14 PM 07/11/2020   11:23 AM 02/15/2020  8:59 AM 07/31/2017    9:52 AM 12/11/2016    6:31 PM  PHQ 2/9 Scores  PHQ - 2 Score 0 0 0 0 0 0 0  PHQ- 9 Score  1   0      Fall Risk    01/21/2023    9:22 AM 11/22/2022    7:44 AM 09/19/2022    2:10 PM 07/26/2022    4:27 PM 01/31/2022    4:14 PM  Fall Risk   Falls in the past year? 0 0 0 0 0  Number falls in past yr: 0 0 0 0 0  Injury with Fall? 0 0 0 0 0  Risk for fall due to : No Fall Risks  No Fall Risks No Fall Risks Medication side effect  Follow up Falls prevention discussed;Falls evaluation completed Falls evaluation completed Falls evaluation completed Falls  evaluation completed Falls evaluation completed;Education provided;Falls prevention discussed    MEDICARE RISK AT HOME:  Medicare Risk at Home - 01/21/23 0924     Any stairs in or around the home? Yes    If so, are there any without handrails? No    Home free of loose throw rugs in walkways, pet beds, electrical cords, etc? Yes    Adequate lighting in your home to reduce risk of falls? Yes    Life alert? No    Use of a cane, walker or w/c? No    Grab bars in the bathroom? Yes   in shower   Shower chair or bench in shower? No    Elevated toilet seat or a handicapped toilet? Yes             TIMED UP AND GO:  Was the test performed?  No    Cognitive Function:    02/15/2020    9:01 AM  MMSE - Mini Mental State Exam  Orientation to time 5  Orientation to Place 5  Registration 3  Attention/ Calculation 5  Recall 3  Language- repeat 1        01/21/2023    9:25 AM 01/31/2022    4:16 PM  6CIT Screen  What Year? 0 points 0 points  What month? 0 points 0 points  What time? 0 points 0 points  Count back from 20 0 points 0 points  Months in reverse 0 points 0 points  Repeat phrase 0 points 0 points  Total Score 0 points 0 points    Immunizations Immunization History  Administered Date(s) Administered   Covid-19, Mrna,Vaccine(Spikevax)89yrs and older 05/14/2022   Fluad Quad(high Dose 65+) 05/05/2020, 05/28/2021   Hep A / Hep B 06/20/2011, 07/25/2011   Hepatitis B 12/27/2011   Influenza Split 06/10/2012   Influenza Whole 06/07/2006, 04/21/2008   Influenza, High Dose Seasonal PF 05/14/2022   Influenza,inj,Quad PF,6+ Mos 03/18/2013, 03/15/2014, 05/09/2015, 07/31/2017, 05/13/2018, 04/01/2019   Moderna Sars-Covid-2 Vaccination 07/30/2019, 08/25/2019, 05/02/2020, 10/31/2020   Pneumococcal Conjugate-13 06/06/2015   Pneumococcal Polysaccharide-23 04/27/2013   Td 06/11/2005   Tdap 09/06/2014   Zoster, Live 04/27/2013    TDAP status: Up to date  Flu Vaccine status: Up  to date  Pneumococcal vaccine status: Due, Education has been provided regarding the importance of this vaccine. Advised may receive this vaccine at local pharmacy or Health Dept. Aware to provide a copy of the vaccination record if obtained from local pharmacy or Health Dept. Verbalized acceptance and understanding.  Covid-19 vaccine status: Information provided on how to obtain vaccines.   Qualifies for Shingles Vaccine?  Yes   Zostavax completed No   Shingrix Completed?: No.    Education has been provided regarding the importance of this vaccine. Patient has been advised to call insurance company to determine out of pocket expense if they have not yet received this vaccine. Advised may also receive vaccine at local pharmacy or Health Dept. Verbalized acceptance and understanding.  Screening Tests Health Maintenance  Topic Date Due   Zoster Vaccines- Shingrix (1 of 2) 03/16/1999   Pneumonia Vaccine 60+ Years old (3 of 3 - PPSV23 or PCV20) 06/05/2020   COVID-19 Vaccine (6 - 2023-24 season) 09/12/2022   Medicare Annual Wellness (AWV)  02/01/2023   DEXA SCAN  02/15/2024 (Originally 03/15/2014)   INFLUENZA VACCINE  02/06/2023   MAMMOGRAM  06/28/2024   DTaP/Tdap/Td (3 - Td or Tdap) 09/05/2024   Colonoscopy  08/24/2025   Hepatitis C Screening  Completed   HPV VACCINES  Aged Out    Health Maintenance  Health Maintenance Due  Topic Date Due   Zoster Vaccines- Shingrix (1 of 2) 03/16/1999   Pneumonia Vaccine 39+ Years old (3 of 3 - PPSV23 or PCV20) 06/05/2020   COVID-19 Vaccine (6 - 2023-24 season) 09/12/2022   Medicare Annual Wellness (AWV)  02/01/2023    Colorectal cancer screening: Type of screening: Colonoscopy. Completed 08/24/20. Repeat every 5 years  Mammogram status: Ordered 01/21/23. Pt provided with contact info and advised to call to schedule appt.   Bone Density Scan- Patient declined.  Lung Cancer Screening: (Low Dose CT Chest recommended if Age 95-80 years, 20 pack-year  currently smoking OR have quit w/in 15years.) does qualify.   Lung Cancer Screening Referral: no  Additional Screening:  Hepatitis C Screening: does qualify; Completed 07/31/17  Vision Screening: Recommended annual ophthalmology exams for early detection of glaucoma and other disorders of the eye. Is the patient up to date with their annual eye exam?  Yes  Who is the provider or what is the name of the office in which the patient attends annual eye exams? [facility] If pt is not established with a provider, would they like to be referred to a provider to establish care? Yes .   Dental Screening: Recommended annual dental exams for proper oral hygiene    Community Resource Referral / Chronic Care Management: CRR required this visit?  No   CCM required this visit?  No     Plan:     I have personally reviewed and noted the following in the patient's chart:   Medical and social history Use of alcohol, tobacco or illicit drugs  Current medications and supplements including opioid prescriptions. Patient is not currently taking opioid prescriptions. Functional ability and status Nutritional status Physical activity Advanced directives List of other physicians Hospitalizations, surgeries, and ER visits in previous 12 months Vitals Screenings to include cognitive, depression, and falls Referrals and appointments  In addition, I have reviewed and discussed with patient certain preventive protocols, quality metrics, and best practice recommendations. A written personalized care plan for preventive services as well as general preventive health recommendations were provided to patient.     Maryan Puls, LPN   [id number]/[id number]   After Visit Summary: (MyChart) Due to this being a telephonic visit, the after visit summary with patients personalized plan was offered to patient via MyChart   Nurse Notes: none

## 2023-01-28 ENCOUNTER — Ambulatory Visit (INDEPENDENT_AMBULATORY_CARE_PROVIDER_SITE_OTHER): Payer: Medicare HMO | Admitting: Internal Medicine

## 2023-01-28 ENCOUNTER — Encounter: Payer: Self-pay | Admitting: Internal Medicine

## 2023-01-28 VITALS — BP 122/78 | HR 69 | Temp 97.9°F | Ht 65.0 in | Wt 191.0 lb

## 2023-01-28 DIAGNOSIS — K625 Hemorrhage of anus and rectum: Secondary | ICD-10-CM | POA: Diagnosis not present

## 2023-01-28 MED ORDER — HYDROCORTISONE 2.5 % EX CREA: TOPICAL_CREAM | Freq: Three times a day (TID) | CUTANEOUS | 1 refills | Status: DC | PRN

## 2023-01-28 NOTE — Progress Notes (Signed)
Subjective:    Patient ID: [name], female    DOB: 04/05/1951, 72 y.o.   MRN: [id number]  HPI Here due to rectal bleeding  Moved bowels last night Saw small clots and red blood in the bowl--looked like a lot Not a lot of stool in there No pain Thought about ER but decided against it  Went again this morning---blood in the stool itself--but none in the bowl Some itching this morning  Current Outpatient Medications on File Prior to Visit  Medication Sig Dispense Refill   Black Currant Seed Oil 500 MG CAPS Take 1 capsule by mouth daily.     cholecalciferol (VITAMIN D3) 25 MCG (1000 UNIT) tablet Take 1,000 Units by mouth daily.     Coenzyme Q10 (CO Q 10 PO) Take by mouth.     cyclobenzaprine (FLEXERIL) 10 MG tablet Take 0.5-1 tablets (5-10 mg total) by mouth 3 (three) times daily as needed for muscle spasms (neck pain /spasm). 30 tablet 0   dicyclomine (BENTYL) 20 MG tablet Take 1 tablet (20 mg total) by mouth 3 (three) times daily before meals. 90 tablet 1   estradiol (ESTRACE) 0.1 MG/GM vaginal cream APPLY A PEA SIZED AMOUNT TO AFFECTED AREA TWICE A WEEK AS NEEDED 42.5 g 0   fluticasone (FLONASE) 50 MCG/ACT nasal spray Place 2 sprays into both nostrils daily. 16 g 12   loratadine (CLARITIN) 10 MG tablet Take 10 mg by mouth daily.     omega-3 acid ethyl esters (LOVAZA) 1 g capsule Take by mouth 2 (two) times daily.     rosuvastatin (CRESTOR) 20 MG tablet Take 1 tablet (20 mg total) by mouth daily. 90 tablet 3   verapamil (CALAN-SR) 240 MG CR tablet Take 1 tablet (240 mg total) by mouth daily. 90 tablet 2   No current facility-administered medications on file prior to visit.    Allergies  Allergen Reactions   Morphine Sulfate Swelling   Penicillins Swelling    Past Medical History:  Diagnosis Date   Allergy    Anemia    age 7    Diverticulosis    GERD (gastroesophageal reflux disease)    occasional; OTC as needed   HCV (hepatitis C virus)    treated and cured  2016   Hyperlipidemia    Hypertrophic cardiomyopathy (HCC)    Palpitations    Trigger finger of left hand 09/2012   left long finger    Past Surgical History:  Procedure Laterality Date   BREAST REDUCTION SURGERY Bilateral 2001   CHOLECYSTECTOMY  07/17/2011   Procedure: LAPAROSCOPIC CHOLECYSTECTOMY WITH INTRAOPERATIVE CHOLANGIOGRAM;  Surgeon: Currie Paris, MD;  Location: [facility] OR;  Service: General;  Laterality: N/A;   COLONOSCOPY     FOOT GANGLION EXCISION Right    HEMORRHOID SURGERY  1980   LACERATION REPAIR Right 1996   thumb   MYOMECTOMY  10/09/99   x 2   OOPHORECTOMY Right 1980   OOPHORECTOMY Left 2000   OVARIAN CYST DRAINAGE  1980   PARTIAL HYSTERECTOMY  2002   with no remaining ovary   POLYPECTOMY     TRIGGER FINGER RELEASE Left 09/28/2012   Procedure: RELEASE TRIGGER FINGER/A-1 PULLEY;  Surgeon: Jodi Marble, MD;  Location: [facility];  Service: Orthopedics;  Laterality: Left;   UPPER GASTROINTESTINAL ENDOSCOPY      Family History  Problem Relation Age of Onset   Colon cancer Father 41   Diabetes Sister    Hepatitis C Brother  Hypertension Brother    Hepatitis C Brother    Colon cancer Paternal Aunt        dx'd in her 39's    Breast cancer Paternal Aunt        two aunts   Lung cancer Paternal Uncle    Breast cancer Cousin    Liver cancer Cousin    Colon cancer Other        cousin    Colon polyps Neg Hx    Esophageal cancer Neg Hx    Rectal cancer Neg Hx    Stomach cancer Neg Hx     Social History   Socioeconomic History   Marital status: Divorced    Spouse name: Not on file   Number of children: 2   Years of education: Not on file   Highest education level: Not on file  Occupational History   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 of   Sexual activity: Yes    Birth control/protection: Surgical  Other Topics Concern   Not on file  Social History Narrative   Divorced 1982   2 children   Left handed      Are you currently employed ?    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    What type of home do you live in: 1 story or 2 story? two   Caffeine no    Social Determinants of Health   Financial Resource Strain: Low Risk  (01/21/2023)   Overall Financial Resource Strain (CARDIA)    Difficulty of Paying Living Expenses: Not hard at all  Food Insecurity: No Food Insecurity (01/21/2023)   Hunger Vital Sign    Worried About Running Out of Food in the Last Year: Never true    Ran Out of Food in the Last Year: Never true  Transportation Needs: No Transportation Needs (01/21/2023)   PRAPARE - Administrator, Civil Service (Medical): No    Lack of Transportation (Non-Medical): No  Physical Activity: Inactive (01/21/2023)   Exercise Vital Sign    Days of Exercise per Week: 0 days    Minutes of Exercise per Session: 0 min  Stress: No Stress Concern Present (01/21/2023)   [employer] of Occupational Health - Occupational Stress Questionnaire    Feeling of Stress : Not at all  Social Connections: Socially Isolated (01/21/2023)   Social Connection and Isolation Panel [NHANES]    Frequency of Communication with Friends and Family: More than three times a week    Frequency of Social Gatherings with Friends and Family: More than three times a week    Attends Religious Services: Never    Database administrator or Organizations: No     Attends Banker Meetings: Never  Marital Status: Divorced  Catering manager Violence: Not At Risk (01/21/2023)   Humiliation, Afraid, Rape, and Kick questionnaire    Fear of Current or Ex-Partner: No    Emotionally Abused: No    Physically Abused: No    Sexually Abused: No   Review of Systems No dizziness or syncope No N/V Eating fine    Objective:   Physical Exam Constitutional:      Appearance: Normal appearance.  Abdominal:     Palpations: Abdomen is soft.     Tenderness: There is no abdominal tenderness.  Genitourinary:    Comments: Fissure at 6 o'clock No rectal mass and basically no stool in the vault Neurological:     Mental Status: She is alert.            Assessment & Plan:

## 2023-01-28 NOTE — Assessment & Plan Note (Signed)
History most consistent with diverticular bleed---but does have fissure (though less likely to cause that type of bleed. Will treat with HC cream to rectum If recurrent bleeding--should go to ER

## 2023-01-29 ENCOUNTER — Telehealth: Payer: Self-pay

## 2023-01-29 ENCOUNTER — Emergency Department (HOSPITAL_COMMUNITY)
Admission: EM | Admit: 2023-01-29 | Discharge: 2023-01-29 | Disposition: A | Discharge status: Home / Self Care | Payer: Medicare HMO | Attending: Emergency Medicine | Admitting: Emergency Medicine

## 2023-01-29 ENCOUNTER — Emergency Department (HOSPITAL_COMMUNITY): Payer: Medicare HMO

## 2023-01-29 ENCOUNTER — Other Ambulatory Visit: Payer: Self-pay

## 2023-01-29 DIAGNOSIS — K625 Hemorrhage of anus and rectum: Secondary | ICD-10-CM | POA: Diagnosis present

## 2023-01-29 DIAGNOSIS — K5732 Diverticulitis of large intestine without perforation or abscess without bleeding: Secondary | ICD-10-CM | POA: Insufficient documentation

## 2023-01-29 DIAGNOSIS — K5792 Diverticulitis of intestine, part unspecified, without perforation or abscess without bleeding: Secondary | ICD-10-CM

## 2023-01-29 DIAGNOSIS — D259 Leiomyoma of uterus, unspecified: Secondary | ICD-10-CM | POA: Diagnosis not present

## 2023-01-29 DIAGNOSIS — K921 Melena: Secondary | ICD-10-CM | POA: Diagnosis not present

## 2023-01-29 LAB — CBC WITH DIFFERENTIAL/PLATELET
Abs Immature Granulocytes: 0.01 10*3/uL (ref 0.00–0.07)
Basophils Absolute: 0 10*3/uL (ref 0.0–0.1)
Basophils Relative: 1 %
Eosinophils Absolute: 0.2 10*3/uL (ref 0.0–0.5)
Eosinophils Relative: 3 %
HCT: 38.8 % (ref 36.0–46.0)
Hemoglobin: 12.7 g/dL (ref 12.0–15.0)
Immature Granulocytes: 0 %
Lymphocytes Relative: 30 %
Lymphs Abs: 1.4 10*3/uL (ref 0.7–4.0)
MCH: 27.6 pg (ref 26.0–34.0)
MCHC: 32.7 g/dL (ref 30.0–36.0)
MCV: 84.3 fL (ref 80.0–100.0)
Monocytes Absolute: 0.4 10*3/uL (ref 0.1–1.0)
Monocytes Relative: 8 %
Neutro Abs: 2.6 10*3/uL (ref 1.7–7.7)
Neutrophils Relative %: 58 %
Platelets: 169 10*3/uL (ref 150–400)
RBC: 4.6 MIL/uL (ref 3.87–5.11)
RDW: 13.3 % (ref 11.5–15.5)
WBC: 4.5 10*3/uL (ref 4.0–10.5)
nRBC: 0 % (ref 0.0–0.2)

## 2023-01-29 LAB — COMPREHENSIVE METABOLIC PANEL
ALT: 22 U/L (ref 0–44)
AST: 22 U/L (ref 15–41)
Albumin: 4 g/dL (ref 3.5–5.0)
Alkaline Phosphatase: 60 U/L (ref 38–126)
Anion gap: 6 (ref 5–15)
BUN: 6 mg/dL — ABNORMAL LOW (ref 8–23)
CO2: 26 mmol/L (ref 22–32)
Calcium: 9.8 mg/dL (ref 8.9–10.3)
Chloride: 106 mmol/L (ref 98–111)
Creatinine, Ser: 0.62 mg/dL (ref 0.44–1.00)
GFR, Estimated: >60 mL/min (ref >60)
Glucose, Bld: 106 mg/dL — ABNORMAL HIGH (ref 70–99)
Potassium: 3.9 mmol/L (ref 3.5–5.1)
Sodium: 138 mmol/L (ref 135–145)
Total Bilirubin: 0.6 mg/dL (ref 0.3–1.2)
Total Protein: 8.1 g/dL (ref 6.5–8.1)

## 2023-01-29 LAB — TYPE AND SCREEN
ABO/RH(D): O POS
Antibody Screen: NEGATIVE

## 2023-01-29 MED ORDER — METRONIDAZOLE 500 MG PO TABS: 500.0000 mg | ORAL_TABLET | Freq: Two times a day (BID) | ORAL | 0 refills | Status: DC

## 2023-01-29 MED ORDER — CIPROFLOXACIN HCL 500 MG PO TABS: 500.0000 mg | ORAL_TABLET | Freq: Two times a day (BID) | ORAL | 0 refills | Status: DC

## 2023-01-29 MED ORDER — IOHEXOL 300 MG/ML  SOLN
100.0000 mL | Freq: Once | INTRAMUSCULAR | Status: AC | PRN
  Administered 2023-01-29: 100 mL via INTRAVENOUS

## 2023-01-29 NOTE — Telephone Encounter (Signed)
Pt called in and rectal bleeding may be worse and now passing blood clots with bm. Pt saw Dr Alphonsus Sias on 01/28/23 and was advised if recurrent bleeding to go to ED. Pt is going to [facility] ED now. Sending note to Dr Alphonsus Sias and Dr Para March as Lorain Childes to PCP.

## 2023-01-29 NOTE — ED Notes (Signed)
POC occult blood positive--MD made aware

## 2023-01-29 NOTE — ED Triage Notes (Signed)
Pt states she has had blood in her stool for 2-3 days now. Pt saw PCP and was informed if she passed another bloody stool to come to the ER. Denies weakness, lightheadedness, denies SOB

## 2023-01-29 NOTE — ED Provider Notes (Signed)
[facility] AT [facility] Provider Note   CSN: [id number] Arrival date & time: 01/29/23  3664     History  Chief Complaint  Patient presents with   Blood In Stools    Patricia UNCAPHER is a 72 y.o. female.  HPI   Patient has a history of hypertrophic cardiomyopathy, hepatitis C, reflux, diverticulosis, hyperlipidemia, anemia.  Patient states she has a history of colon polyps and her last colonoscopy was 2 years ago.  She presents to the ED for evaluation of rectal bleeding.  Patient states she noticed an episode a couple days ago.  She went to her primary care doctor's office yesterday.  Symptoms were felt to be possibly related to a diverticular bleed.  Patient only had 1 episode of blood in her stool.  She had not had any recurrent episodes and no additional complaints at that time.  Patient was instructed to return to the emergency room she had any recurrent episodes.  Patient states she went to the bathroom this morning and again noticed blood in her stool.  She has some mild discomfort in her left side of her abdomen but patient states this has been ongoing for a while and not a new issue.  Home Medications Prior to Admission medications   Medication Sig Start Date End Date Taking? Authorizing Provider  ciprofloxacin (CIPRO) 500 MG tablet Take 1 tablet (500 mg total) by mouth 2 (two) times daily. 01/29/23  Yes Linwood Dibbles, MD  metroNIDAZOLE (FLAGYL) 500 MG tablet Take 1 tablet (500 mg total) by mouth 2 (two) times daily. 01/29/23  Yes Linwood Dibbles, MD  Black Currant Seed Oil 500 MG CAPS Take 1 capsule by mouth daily.    [provider]  cholecalciferol (VITAMIN D3) 25 MCG (1000 UNIT) tablet Take 1,000 Units by mouth daily.    [provider]  Coenzyme Q10 (CO Q 10 PO) Take by mouth.    [provider]  cyclobenzaprine (FLEXERIL) 10 MG tablet Take 0.5-1 tablets (5-10 mg total) by mouth 3 (three) times daily as needed for muscle  spasms (neck pain /spasm). 06/27/22   Tower, Audrie Gallus, MD  dicyclomine (BENTYL) 20 MG tablet Take 1 tablet (20 mg total) by mouth 3 (three) times daily before meals. 06/16/22   Joaquim Nam, MD  estradiol (ESTRACE) 0.1 MG/GM vaginal cream APPLY A PEA SIZED AMOUNT TO AFFECTED AREA TWICE A WEEK AS NEEDED 02/25/22   Joaquim Nam, MD  fluticasone [facility]) 50 MCG/ACT nasal spray Place 2 sprays into both nostrils daily. 05/05/20   Joaquim Nam, MD  hydrocortisone 2.5 % cream Apply topically 3 (three) times daily as needed. 01/28/23   Karie Schwalbe, MD  loratadine (CLARITIN) 10 MG tablet Take 10 mg by mouth daily.    [provider]  omega-3 acid ethyl esters (LOVAZA) 1 g capsule Take by mouth 2 (two) times daily.    [provider]  rosuvastatin (CRESTOR) 20 MG tablet Take 1 tablet (20 mg total) by mouth daily. 12/23/22   Rollene Rotunda, MD  verapamil (CALAN-SR) 240 MG CR tablet Take 1 tablet (240 mg total) by mouth daily. 09/09/22   Joaquim Nam, MD      Allergies    Morphine sulfate and Penicillins    Review of Systems   Review of Systems  Physical Exam Updated Vital Signs BP (!) 138/98   Pulse 72   Temp 98.1 F (36.7 C) (Oral)   Ht 1.651 m (  5\' 5" )   Wt 86.6 kg   SpO2 100%   BMI 31.78 kg/m  Physical Exam Vitals and nursing note reviewed.  Constitutional:      General: She is not in acute distress.    Appearance: She is well-developed.  HENT:     Head: Normocephalic and atraumatic.     Right Ear: External ear normal.     Left Ear: External ear normal.  Eyes:     General: No scleral icterus.       Right eye: No discharge.        Left eye: No discharge.     Conjunctiva/sclera: Conjunctivae normal.  Neck:     Trachea: No tracheal deviation.  Cardiovascular:     Rate and Rhythm: Normal rate and regular rhythm.  Pulmonary:     Effort: Pulmonary effort is normal. No respiratory distress.     Breath sounds: Normal breath sounds. No stridor. No  wheezing or rales.  Abdominal:     General: Bowel sounds are normal. There is no distension.     Palpations: Abdomen is soft.     Tenderness: There is no abdominal tenderness. There is no guarding or rebound.  Genitourinary:    Comments: No mass appreciated on rectal exam, no obvious source of bleeding, blood tinge noted on glove after dre Musculoskeletal:        General: No tenderness or deformity.     Cervical back: Neck supple.  Skin:    General: Skin is warm and dry.     Findings: No rash.  Neurological:     General: No focal deficit present.     Mental Status: She is alert.     Cranial Nerves: No cranial nerve deficit, dysarthria or facial asymmetry.     Sensory: No sensory deficit.     Motor: No abnormal muscle tone or seizure activity.     Coordination: Coordination normal.  Psychiatric:        Mood and Affect: Mood normal.     ED Results / Procedures / Treatments   Labs (all labs ordered are listed, but only abnormal results are displayed) Labs Reviewed  COMPREHENSIVE METABOLIC PANEL - Abnormal; Notable for the following components:      Result Value   Glucose, Bld 106 (*)    BUN 6 (*)    All other components within normal limits  CBC WITH DIFFERENTIAL/PLATELET  TYPE AND SCREEN    EKG None  Radiology CT ABDOMEN PELVIS W CONTRAST  Result Date: 01/29/2023 CLINICAL DATA:  Diverticulitis.  Blood in stool for 3 days. EXAM: CT ABDOMEN AND PELVIS WITH CONTRAST TECHNIQUE: Multidetector CT imaging of the abdomen and pelvis was performed using the standard protocol following bolus administration of intravenous contrast. RADIATION DOSE REDUCTION: This exam was performed according to the departmental dose-optimization program which includes automated exposure control, adjustment of the mA and/or kV according to patient size and/or use of iterative reconstruction technique. CONTRAST:  OMNIPAQUE IOHEXOL 300 MG/ML  SOLN COMPARISON:  06/26/2015 FINDINGS: Lower Chest: No  acute findings. Hepatobiliary: No suspicious hepatic masses identified. Prior cholecystectomy. No evidence of biliary obstruction. Pancreas:  No mass or inflammatory changes. Spleen: Within normal limits in size and appearance. Adrenals/Urinary Tract: No suspicious masses identified. No evidence of ureteral calculi or hydronephrosis. Stomach/Bowel: Normal appendix visualized. Diverticulosis is seen throughout the descending and sigmoid colon. Mild wall thickening and pericolonic soft tissue stranding is seen involving the descending colon, consistent with mild diverticulitis. No evidence of perforation or  abscess. Vascular/Lymphatic: No pathologically enlarged lymph nodes. No acute vascular findings. Aortic atherosclerotic calcification incidentally noted. Reproductive: Calcified uterine fibroid seen in the right fundal region measuring 3.6 cm. Adnexal regions are unremarkable. Other:  None. Musculoskeletal:  No suspicious bone lesions identified. IMPRESSION: Mild diverticulitis involving the descending colon. No evidence of perforation or abscess. 3.6 cm calcified uterine fibroid. Aortic Atherosclerosis (ICD10-I70.0). Electronically Signed   By: Danae Orleans M.D.   On: 01/29/2023 11:08    Procedures Procedures    Medications Ordered in ED Medications  iohexol (OMNIPAQUE) 300 MG/ML solution 100 mL (100 mLs Intravenous Contrast Given 01/29/23 1042)    ED Course/ Medical Decision Making/ A&P Clinical Course as of 01/29/23 1158  Wed Jan 29, 2023  1011 CBC normal.  Metabolic panel normal. [JK]  1116 CT scan does show mild diverticulitis.  No evidence of perforation or abscess, calcified uterine fibroid noted [JK]    Clinical Course User Index [JK] Linwood Dibbles, MD                             Medical Decision Making Problems Addressed: Diverticulitis: acute illness or injury that poses a threat to life or bodily functions  Amount and/or Complexity of Data Reviewed Labs: ordered. Decision-making  details documented in ED Course. Radiology: ordered and independent interpretation performed.  Risk Prescription drug management.   Patient presented to the ED with concerns of rectal bleeding.  Patient does have history of diverticulitis.  She did have a colonoscopy 2 years ago and had polyps but no history of colon cancer.  Patient's ED workup shows normal hemoglobin.  She has not had any large-volume rectal bleeding.  Patient CT scan does show evidence of diverticulitis.  This would account for her rectal bleeding as well as her left-sided domino discomfort.  Patient is afebrile nontoxic.  She does not have a fever.  No leukocytosis.  No signs of complicating features on the CT scan.  We discussed inpatient versus outpatient treatment.  I feel patient is appropriate for outpatient management and she is comfortable with this plan.  Patient understands she needs to return to the hospital if she starts having heavy rectal bleeding fevers lightheadedness or other concerning symptoms.        Final Clinical Impression(s) / ED Diagnoses Final diagnoses:  Diverticulitis    Rx / DC Orders ED Discharge Orders          Ordered    ciprofloxacin (CIPRO) 500 MG tablet  2 times daily        01/29/23 1155    metroNIDAZOLE (FLAGYL) 500 MG tablet  2 times daily        01/29/23 1155              Linwood Dibbles, MD 01/29/23 1158

## 2023-01-29 NOTE — Discharge Instructions (Signed)
The CT scan did show diverticulitis.  This would explain your abdominal discomfort as well as the blood in your stool.  Take the antibiotics as prescribed.  Follow-up with your doctor to be rechecked to make sure your symptoms resolved.  You may continue to notice small amount of blood in your stool over the next several days.  Return to the emergency room if you notice increasing rectal bleeding, lightheadedness, fevers, vomiting or other concerning symptoms

## 2023-01-29 NOTE — Telephone Encounter (Signed)
Agree. Thanks

## 2023-01-30 ENCOUNTER — Emergency Department (HOSPITAL_COMMUNITY)
Admission: EM | Admit: 2023-01-30 | Discharge: 2023-01-30 | Disposition: A | Discharge status: Home / Self Care | Payer: Medicare HMO | Attending: Emergency Medicine | Admitting: Emergency Medicine

## 2023-01-30 ENCOUNTER — Encounter (HOSPITAL_COMMUNITY): Payer: Self-pay

## 2023-01-30 ENCOUNTER — Emergency Department (HOSPITAL_COMMUNITY): Payer: Medicare HMO

## 2023-01-30 DIAGNOSIS — R079 Chest pain, unspecified: Secondary | ICD-10-CM | POA: Insufficient documentation

## 2023-01-30 DIAGNOSIS — R42 Dizziness and giddiness: Secondary | ICD-10-CM | POA: Insufficient documentation

## 2023-01-30 DIAGNOSIS — R202 Paresthesia of skin: Secondary | ICD-10-CM | POA: Diagnosis not present

## 2023-01-30 DIAGNOSIS — Z87891 Personal history of nicotine dependence: Secondary | ICD-10-CM | POA: Insufficient documentation

## 2023-01-30 DIAGNOSIS — I700 Atherosclerosis of aorta: Secondary | ICD-10-CM | POA: Diagnosis not present

## 2023-01-30 DIAGNOSIS — R0789 Other chest pain: Secondary | ICD-10-CM | POA: Diagnosis not present

## 2023-01-30 LAB — CBC
HCT: 35.2 % — ABNORMAL LOW (ref 36.0–46.0)
Hemoglobin: 11.6 g/dL — ABNORMAL LOW (ref 12.0–15.0)
MCH: 27.6 pg (ref 26.0–34.0)
MCHC: 33 g/dL (ref 30.0–36.0)
MCV: 83.8 fL (ref 80.0–100.0)
Platelets: 153 10*3/uL (ref 150–400)
RBC: 4.2 MIL/uL (ref 3.87–5.11)
RDW: 13.4 % (ref 11.5–15.5)
WBC: 4.1 10*3/uL (ref 4.0–10.5)
nRBC: 0 % (ref 0.0–0.2)

## 2023-01-30 LAB — BASIC METABOLIC PANEL
Anion gap: 6 (ref 5–15)
BUN: 9 mg/dL (ref 8–23)
CO2: 26 mmol/L (ref 22–32)
Calcium: 9.7 mg/dL (ref 8.9–10.3)
Chloride: 105 mmol/L (ref 98–111)
Creatinine, Ser: 0.66 mg/dL (ref 0.44–1.00)
GFR, Estimated: >60 mL/min (ref >60)
Glucose, Bld: 134 mg/dL — ABNORMAL HIGH (ref 70–99)
Potassium: 3.7 mmol/L (ref 3.5–5.1)
Sodium: 137 mmol/L (ref 135–145)

## 2023-01-30 LAB — TROPONIN I (HIGH SENSITIVITY)
Troponin I (High Sensitivity): 3 ng/L (ref <18)
Troponin I (High Sensitivity): 3 ng/L (ref <18)

## 2023-01-30 LAB — POC OCCULT BLOOD, ED: Fecal Occult Bld: POSITIVE — AB

## 2023-01-30 NOTE — ED Provider Notes (Signed)
[facility] Emergency Department Provider Note MRN:  [id number]  Arrival date & time: 01/30/23     Chief Complaint   Dizziness and Tingling   History of Present Illness   [name] is a 72 y.o. year-old female with a history of cardiomyopathy presenting to the ED with chief complaint of dizziness.  Chest pressure intermittently with lightheadedness, tingling to arms and legs, some pain to the left jaw as well.  Feels better now.  Recently started cipro and flagyl for diverticulitis.    Review of Systems  A thorough review of systems was obtained and all systems are negative except as noted in the HPI and PMH.   Patient's Health History    Past Medical History:  Diagnosis Date   Allergy    Anemia    age 89    Diverticulosis    GERD (gastroesophageal reflux disease)    occasional; OTC as needed   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   Hyperlipidemia    Hypertrophic cardiomyopathy (HCC)    Palpitations    Trigger finger of left hand 09/2012   left long finger    Past Surgical History:  Procedure Laterality Date   BREAST REDUCTION SURGERY Bilateral 2001   CHOLECYSTECTOMY  07/17/2011   Procedure: LAPAROSCOPIC CHOLECYSTECTOMY WITH INTRAOPERATIVE CHOLANGIOGRAM;  Surgeon: Currie Paris, MD;  Location: [facility] OR;  Service: General;  Laterality: N/A;   COLONOSCOPY     FOOT GANGLION EXCISION Right    HEMORRHOID SURGERY  1980   LACERATION REPAIR Right 1996   thumb   MYOMECTOMY  10/09/99   x 2   OOPHORECTOMY Right 1980   OOPHORECTOMY Left 2000   OVARIAN CYST DRAINAGE  1980   PARTIAL HYSTERECTOMY  2002   with no remaining ovary   POLYPECTOMY     TRIGGER FINGER RELEASE Left 09/28/2012   Procedure: RELEASE TRIGGER FINGER/A-1 PULLEY;  Surgeon: Jodi Marble, MD;  Location: [facility];  Service: Orthopedics;  Laterality: Left;   UPPER GASTROINTESTINAL ENDOSCOPY      Family History  Problem Relation Age of Onset    Colon cancer Father 28   Diabetes Sister    Hepatitis C Brother    Hypertension Brother    Hepatitis C Brother    Colon cancer Paternal Aunt        dx'd in her 68's    Breast cancer Paternal Aunt        two aunts   Lung cancer Paternal Uncle    Breast cancer Cousin    Liver cancer Cousin    Colon cancer Other        cousin    Colon polyps Neg Hx    Esophageal cancer Neg Hx    Rectal cancer Neg Hx    Stomach cancer Neg Hx     Social History   Socioeconomic History   Marital status: Divorced    Spouse name: Not on file   Number of children: 2   Years of education: Not on file   Highest education level: Not on file  Occupational History   Occupation: [employer] from home  Tobacco Use   Smoking status: Former    Current packs/day: 0.00    Average packs/day: 2.5 packs/day for 42.0 years (105.0 ttl pk-yrs)    Types: Cigarettes    Start date: 07/08/1963    Quit date: 07/07/2005    Years since quitting: 17.5   Smokeless tobacco: Never  Tobacco comments:    quit smoking 2008  Vaping Use   Vaping status: Never Used  Substance and Sexual Activity   Alcohol use: Not Currently    Comment: rarely   Drug use: Yes    Frequency: 7.0 times per week    Types: Marijuana    Comment: previous hx. of   Sexual activity: Yes    Birth control/protection: Surgical  Other Topics Concern   Not on file  Social History Narrative   Divorced 1982   2 children   Left handed      Are you currently employed ?    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    What type of home do you live in: 1 story or 2 story? two   Caffeine no    Social Determinants of Health   Financial Resource Strain: Low Risk  (01/21/2023)   Overall Financial Resource Strain (CARDIA)    Difficulty of Paying Living Expenses: Not hard at all  Food Insecurity: No Food Insecurity (01/21/2023)   Hunger Vital Sign    Worried About Running Out of Food in the Last Year:  Never true    Ran Out of Food in the Last Year: Never true  Transportation Needs: No Transportation Needs (01/21/2023)   PRAPARE - Administrator, Civil Service (Medical): No    Lack of Transportation (Non-Medical): No  Physical Activity: Inactive (01/21/2023)   Exercise Vital Sign    Days of Exercise per Week: 0 days    Minutes of Exercise per Session: 0 min  Stress: No Stress Concern Present (01/21/2023)   [employer] of Occupational Health - Occupational Stress Questionnaire    Feeling of Stress : Not at all  Social Connections: Socially Isolated (01/21/2023)   Social Connection and Isolation Panel [NHANES]    Frequency of Communication with Friends and Family: More than three times a week    Frequency of Social Gatherings with Friends and Family: More than three times a week    Attends Religious Services: Never    Database administrator or Organizations: No    Attends Banker Meetings: Never    Marital Status: Divorced  Catering manager Violence: Not At Risk (01/21/2023)   Humiliation, Afraid, Rape, and Kick questionnaire    Fear of Current or Ex-Partner: No    Emotionally Abused: No    Physically Abused: No    Sexually Abused: No     Physical Exam   Vitals:   01/30/23 0230 01/30/23 0245  BP: 118/81 126/78  Pulse: 66 64  Resp: 17   Temp:    SpO2: 99% 92%    CONSTITUTIONAL:  well-appearing, NAD NEURO/PSYCH:  Alert and oriented x 3, no focal deficits EYES:  eyes equal and reactive ENT/NECK:  no LAD, no JVD CARDIO:  regular rate, well-perfused, normal S1 and S2 PULM:  CTAB no wheezing or rhonchi GI/GU:  non-distended, non-tender MSK/SPINE:  No gross deformities, no edema SKIN:  no rash, atraumatic   *Additional and/or pertinent findings included in MDM below  Diagnostic and Interventional Summary    EKG Interpretation Date/Time:  Thursday January 30 2023 00:36:43 EDT Ventricular Rate:  67 PR Interval:  165 QRS Duration:  98 QT  Interval:  345 QTC Calculation: 365 R Axis:   58  Text Interpretation: Sinus rhythm Sinus pause Nonspecific T abnrm, anterolateral leads Baseline wander in lead(s) III aVL aVF  V3 V4 No significant change was found Confirmed by Kennis Carina [phone]) on 01/30/2023 2:03:33 AM       Labs Reviewed  CBC - Abnormal; Notable for the following components:      Result Value   Hemoglobin 11.6 (*)    HCT 35.2 (*)    All other components within normal limits  BASIC METABOLIC PANEL - Abnormal; Notable for the following components:   Glucose, Bld 134 (*)    All other components within normal limits  TROPONIN I (HIGH SENSITIVITY)  TROPONIN I (HIGH SENSITIVITY)    DG Chest Port 1 View  Final Result      Medications - No data to display   Procedures  /  Critical Care Procedures  ED Course and Medical Decision Making  Initial Impression and Ddx ACS is considered.  No rash to suggest allergic reaction.  Side effect to first dose of cipro/flagyl possible.  Doubt PE.  Past medical/surgical history that increases complexity of ED encounter: Cardiomyopathy  Interpretation of Diagnostics I personally reviewed the EKG and my interpretation is as follows: Sinus rhythm with no significant change from prior  Labs reassuring with no significant blood count or electrolyte disturbance.  Troponin negative x 2  Patient Reassessment and Ultimate Disposition/Management     Discharge  Patient management required discussion with the following services or consulting groups:  None  Complexity of Problems Addressed Acute illness or injury that poses threat of life of bodily function  Additional Data Reviewed and Analyzed Further history obtained from: Further history from spouse/family member  Additional Factors Impacting ED Encounter Risk None  Elmer Sow. Pilar Plate, MD [facility] Emergency Medicine [facility] [email or web address]  Final Clinical Impressions(s) / ED Diagnoses      ICD-10-CM   1. Chest pain, unspecified type  R07.9       ED Discharge Orders     None        Discharge Instructions Discussed with and Provided to Patient:     Discharge Instructions      You were evaluated in the Emergency Department and after careful evaluation, we did not find any emergent condition requiring admission or further testing in the hospital.  Your exam/testing today is overall reassuring.  Please return to the Emergency Department if you experience any worsening of your condition.   Thank you for allowing [place] to be a part of your care.       Sabas Sous, MD 01/30/23 [phone]

## 2023-01-30 NOTE — Discharge Instructions (Signed)
You were evaluated in the Emergency Department and after careful evaluation, we did not find any emergent condition requiring admission or further testing in the hospital.  Your exam/testing today is overall reassuring.  Please return to the Emergency Department if you experience any worsening of your condition.   Thank you for allowing us to be a part of your care. 

## 2023-01-30 NOTE — ED Triage Notes (Signed)
C/o dizziness, tingling to hands and feet, and tightness in throat 2 hrs after taking flagyl and cipro this am.  Denies sob, cp.  A&O x4 and ambulatory to triage.

## 2023-02-05 ENCOUNTER — Telehealth: Payer: Self-pay | Admitting: *Deleted

## 2023-02-05 NOTE — Telephone Encounter (Signed)
Transition Care Management Follow-up Telephone Call Date of discharge and from where: 01/30/2023   [facility]  How have you been since you were released from the hospital? Much better  Any questions or concerns? No  Items Reviewed: Did the pt receive and understand the discharge instructions provided? Yes  Medications obtained and verified? No  Other? No  Any new allergies since your discharge? No  Dietary orders reviewed? No Do you have support at home?Yes      Follow up appointments reviewed:  PCP Hospital f/u appt confirmed? No  I dont think I need I'm fine the antibiotic should be finished on Fridays says if she feels bad after that maybe she will   Are transportation arrangements needed? No  If their condition worsens, is the pt aware to call PCP or go to the Emergency Dept.? Yes Was the patient provided with contact information for the PCP's office or ED? Yes Was to pt encouraged to call back with questions or concerns? Yes

## 2023-02-06 ENCOUNTER — Other Ambulatory Visit: Payer: Self-pay

## 2023-04-29 ENCOUNTER — Encounter: Payer: Self-pay | Admitting: *Deleted

## 2023-05-21 DIAGNOSIS — H5203 Hypermetropia, bilateral: Secondary | ICD-10-CM | POA: Diagnosis not present

## 2023-05-22 ENCOUNTER — Other Ambulatory Visit: Payer: Self-pay | Admitting: Family Medicine

## 2023-05-22 NOTE — Telephone Encounter (Signed)
Last office visit:09/19/22 Next office visit: Nothing scheduled  Last refill: 06/16/22 dicyclomine (BENTYL) 20 MG tablet qty 90 tablets with 1 refill

## 2023-05-23 NOTE — Telephone Encounter (Signed)
Sent. Thanks.   

## 2023-06-23 ENCOUNTER — Encounter: Payer: Self-pay | Admitting: Family Medicine

## 2023-06-23 ENCOUNTER — Ambulatory Visit (INDEPENDENT_AMBULATORY_CARE_PROVIDER_SITE_OTHER): Payer: Medicare HMO | Admitting: Family Medicine

## 2023-06-23 VITALS — BP 124/70 | HR 67 | Temp 98.4°F | Ht 65.0 in | Wt 189.2 lb

## 2023-06-23 DIAGNOSIS — M79606 Pain in leg, unspecified: Secondary | ICD-10-CM | POA: Diagnosis not present

## 2023-06-23 NOTE — Progress Notes (Unsigned)
Leg pain started last month.  Starts L lateral hip and radiates down past the L knee to the upper lateral shin.  Can radiate medially across the L knee.  Blue emu rub didn't help.  Not constant but each episode is similar to the previous.  No R leg sx.  She is clearly better than prior.  Prev more pain with walking.  No trauma.  No FCNAVD.    Flu shot encouraged.    Meds, vitals, and allergies reviewed.   ROS: Per HPI unless specifically indicated in ROS section   Nad Able to bear weight.  Normal L hip internal/external rotation.  Normal hip flexion.  Greater trochanteric area not tender to palpation. Left knee not tender to palpation on medial lateral joint line.  Knee is stable on testing for ACL MCL and LCL.  No crepitus.  No locking. She is tender along the left ITB.  Quad not tender otherwise.

## 2023-06-23 NOTE — Patient Instructions (Signed)
Possible ITB irritation.  Try icing, gently stretch and use a foam roller.  Take care.  Glad to see you.

## 2023-06-26 NOTE — Assessment & Plan Note (Signed)
Discussed anatomy. Possible ITB irritation.  Discussed that she could try icing, gently stretch and use a foam roller.  Update me as needed.  She agrees to plan.

## 2023-06-30 ENCOUNTER — Ambulatory Visit (HOSPITAL_COMMUNITY)
Admission: RE | Admit: 2023-06-30 | Discharge: 2023-06-30 | Disposition: A | Discharge status: Home / Self Care | Payer: Medicare HMO | Source: Ambulatory Visit | Attending: Family Medicine | Admitting: Family Medicine

## 2023-06-30 DIAGNOSIS — Z1231 Encounter for screening mammogram for malignant neoplasm of breast: Secondary | ICD-10-CM | POA: Diagnosis not present

## 2023-07-14 ENCOUNTER — Telehealth: Payer: Self-pay | Admitting: Family Medicine

## 2023-07-14 DIAGNOSIS — M79606 Pain in leg, unspecified: Secondary | ICD-10-CM

## 2023-07-14 NOTE — Telephone Encounter (Signed)
 Copied from CRM [id number]. Topic: Clinical - Medical Advice >> Jul 14, 2023 10:37 AM Montie POUR wrote: Reason for CRM: [name] is still having pain in left leg. Pain is going down leg from hip to knee. It is not getting any better and she wants to be transferred to a specialist. Please call [phone] with questions. Please message referral through MyChart.

## 2023-07-15 NOTE — Telephone Encounter (Signed)
 Spoke with patient and advised that referral has been sent.

## 2023-07-15 NOTE — Telephone Encounter (Signed)
I put in the referral to ortho.  Thanks.

## 2023-07-15 NOTE — Addendum Note (Signed)
 Addended by: Joaquim Nam on: 07/15/2023 07:01 AM   Modules accepted: Orders

## 2023-07-17 ENCOUNTER — Other Ambulatory Visit: Payer: Self-pay | Admitting: Family Medicine

## 2023-07-17 ENCOUNTER — Ambulatory Visit: Payer: Medicare HMO

## 2023-07-23 ENCOUNTER — Other Ambulatory Visit: Payer: Self-pay

## 2023-07-23 DIAGNOSIS — E785 Hyperlipidemia, unspecified: Secondary | ICD-10-CM

## 2023-07-23 MED ORDER — ROSUVASTATIN CALCIUM 20 MG PO TABS
20.0000 mg | ORAL_TABLET | Freq: Every day | ORAL | 1 refills | Status: DC
Start: 2023-07-23 — End: 2024-02-02

## 2023-07-25 ENCOUNTER — Other Ambulatory Visit: Payer: Self-pay | Admitting: Family Medicine

## 2023-07-25 NOTE — Telephone Encounter (Signed)
Copied from CRM [id number]. Topic: Clinical - Medication Refill >> Jul 25, 2023  3:29 PM Adele Barthel wrote: Most Recent Primary Care Visit:  Provider: Joaquim Nam  Department: [facility]  Visit Type: OFFICE VISIT  Date: 06/23/2023  Medication: verapamil (CALAN-SR) 240 MG CR tablet   Has the patient contacted their pharmacy? Yes (Agent: If no, request that the patient contact the pharmacy for the refill. If patient does not wish to contact the pharmacy document the reason why and proceed with request.) (Agent: If yes, when and what did the pharmacy advise?)  Is this the correct pharmacy for this prescription? Yes If no, delete pharmacy and type the correct one.  This is the patient's preferred pharmacy:   [facility](was not on file, rep Alanna called in request) Phone: [phone]  Has the prescription been filled recently? Yes  Is the patient out of the medication? No  Has the patient been seen for an appointment in the last year OR does the patient have an upcoming appointment? Yes  Can we respond through MyChart? Yes  Agent: Please be advised that Rx refills may take up to 3 business days. We ask that you follow-up with your pharmacy.

## 2023-07-30 ENCOUNTER — Telehealth: Payer: Self-pay | Admitting: Family Medicine

## 2023-07-30 NOTE — Telephone Encounter (Signed)
Copied from CRM [id number]. Topic: Referral - Status >> Jul 30, 2023 10:05 AM Thomes Dinning wrote: Reason for CRM: Patient is asking for an update on her referral for an orthopedic specialist. Please call her back at [phone]

## 2023-07-30 NOTE — Telephone Encounter (Signed)
Please check on status.

## 2023-08-04 ENCOUNTER — Ambulatory Visit: Payer: Self-pay | Admitting: Family Medicine

## 2023-08-04 ENCOUNTER — Telehealth (INDEPENDENT_AMBULATORY_CARE_PROVIDER_SITE_OTHER): Payer: Medicare HMO | Admitting: Family Medicine

## 2023-08-04 VITALS — BP 139/81 | HR 105 | Temp 99.5°F | Ht 65.0 in

## 2023-08-04 DIAGNOSIS — B349 Viral infection, unspecified: Secondary | ICD-10-CM | POA: Diagnosis not present

## 2023-08-04 NOTE — Progress Notes (Unsigned)
   Katheryn Culliton T. Mathieu Schloemer, MD, CAQ Sports Medicine [facility] [address] [place], [address]  Phone: [phone]  FAX: [phone]  [name] - 73 y.o. female  MRN [id number]  Date of Birth: [birth year]  Date: 08/04/2023  PCP: Joaquim Nam, MD  Referral: Joaquim Nam, MD  Chief Complaint  Patient presents with   Generalized Body Aches    Started last Thursday No Covid Test   Headache   Virtual Visit via Video Note:  I connected with  [name] on 08/04/2023 10:20 AM EST by a video enabled telemedicine application and verified that I am speaking with the correct person using two identifiers.   Location patient: home computer, tablet, or smartphone Location provider: work or home office Consent: Verbal consent directly obtained from [employer] Persons participating in the virtual visit: patient, provider  I discussed the limitations of evaluation and management by telemedicine and the availability of in person appointments. The patient expressed understanding and agreed to proceed.  Chief Complaint  Patient presents with   Generalized Body Aches    Started last Thursday No Covid Test   Headache    History of Present Illness:  Started to get sick on Thursday. Taking some Nyquil a few times Now head is hurting Not going away - this morning is the worst.  Had a hard time getting off her house.  She has a lot of polyarthralgia Intermittent low grade fever  Never this sick Lives a lone  Quit smoking for the last 16 years  No ST, no earache Swallowing ok  She will check a covid and flu test at home and get back to me for possible anti-virals ABX is all negative.  Review of Systems as above: See pertinent positives and pertinent negatives per HPI No acute distress verbally   Observations/Objective/Exam:  An attempt was made to discern vital signs over the phone and per patient if applicable and  possible.   General:    Alert, Oriented, appears well and in no acute distress  Pulmonary:     On inspection no signs of respiratory distress.  Psych / Neurological:     Pleasant and cooperative.  Assessment and Plan:    ICD-10-CM   1. Viral syndrome  B34.9      I asked the patient to check a Covid and flu test at home and get back to me with the results.  If Covid positive would start on Paxlovid.   I discussed the assessment and treatment plan with the patient. The patient was provided an opportunity to ask questions and all were answered. The patient agreed with the plan and demonstrated an understanding of the instructions.   The patient was advised to call back or seek an in-person evaluation if the symptoms worsen or if the condition fails to improve as anticipated.  Follow-up: prn unless noted otherwise below No follow-ups on file.  No orders of the defined types were placed in this encounter.  No orders of the defined types were placed in this encounter.   Signed,  Elpidio Galea. Sharina Petre, MD

## 2023-08-04 NOTE — Telephone Encounter (Signed)
Copied from CRM [id number]. Topic: Clinical - Red Word Triage >> Aug 04, 2023  9:37 AM Denese Killings wrote: Red Word that prompted transfer to Nurse Triage: Patient thinks she has the flu and she is really sore. chest is congested/tight, and temperature of 99.5. She states that she can't breathe in while coughing.   Chief Complaint: Body Aches and Weakness Symptoms: Body Aches, Headache,  Frequency: Since Thursday Pertinent Negatives: Patient denies chest pain, shortness of breath, or fever.  Disposition: [] ED /[] Urgent Care (no appt availability in office) / [x] Appointment(In office/virtual)/ []  [place] [facility]/ [] Home Care/ [] Refused Recommended Disposition /[] [place] [facility]/ []  Follow-up with PCP Additional Notes: SM is a 73 year old female being triaged for body aches and was nearly unable to get out of bed this morning. The patient reports congestion and a first time experience of body aches like this. The patient denies a fever, but reports a general feeling of malaise. Unable to drive or come in person to an appointment. Opted for a virtual visit with [facility].    Reason for Disposition  [1] MODERATE pain (e.g., interferes with normal activities) AND [2] present > 3 days  Answer Assessment - Initial Assessment Questions 1. ONSET: "When did the muscle aches or body pains start?"      Since Thursday  2. LOCATION: "What part of your body is hurting?" (e.g., entire body, arms, legs)      Generalized  3. SEVERITY: "How bad is the pain?" (Scale 1-10; or mild, moderate, severe)   - MILD (1-3): doesn't interfere with normal activities    - MODERATE (4-7): interferes with normal activities or awakens from sleep    - SEVERE (8-10):  excruciating pain, unable to do any normal activities      5  4. CAUSE: "What do you think is causing the pains?"     Unsure  5. FEVER: "Have you been having fever?"     No  6. OTHER SYMPTOMS: "Do you have any other symptoms?" (e.g.,  chest pain, weakness, rash, cold or flu symptoms, weight loss)     Chest Congestion, Runny Nose  7. PREGNANCY: "Is there any chance you are pregnant?" "When was your last menstrual period?"     No  8. TRAVEL: "Have you traveled out of the country in the last month?" (e.g., travel history, exposures)     No  Protocols used: Muscle Aches and Body Pain-A-AH

## 2023-08-05 ENCOUNTER — Encounter: Payer: Self-pay | Admitting: Family Medicine

## 2023-08-06 NOTE — Telephone Encounter (Signed)
Is she having pain at this point?  Does she want the referral?

## 2023-08-06 NOTE — Telephone Encounter (Signed)
Per notes in the referral, the patient declined the appt when Ortho contacted them. The referral was closed by Ortho.   Auto: Referral message   ----- Message ----- From: Pattricia Boss Sent: 08/01/2023   3:22 PM EST To: Joaquim Nam, MD   Good afternoon, Dr. Para March. I spoke with Mrs. Hoog about scheduling, she states at this time she is no longer having any pain. She would like to forgo scheduling at this time. Thank you for trusting [place] to care for your patient.

## 2023-08-06 NOTE — Telephone Encounter (Signed)
She states that she is not having any pain and does not want the referral

## 2023-08-07 ENCOUNTER — Telehealth: Payer: Self-pay | Admitting: Family Medicine

## 2023-08-07 ENCOUNTER — Ambulatory Visit: Payer: Self-pay | Admitting: Family Medicine

## 2023-08-07 MED ORDER — DOXYCYCLINE HYCLATE 100 MG PO TABS: 100.0000 mg | ORAL_TABLET | Freq: Two times a day (BID) | ORAL | 0 refills | Status: DC

## 2023-08-07 NOTE — Telephone Encounter (Signed)
Copied from CRM [id number]. Topic: Clinical - Medication Question >> Aug 07, 2023 10:21 AM Maxwell Marion wrote: Reason for CRM: Patient was recently seen on 1/27 for generalized body aches and headache. She wanted to know if an antibiotic could be called in for her to the [facility] because she is not getting any better. Patient asked for a call back today to let her know if the doctor will be able to call this in for her.

## 2023-08-07 NOTE — Telephone Encounter (Addendum)
Prev note reviewed.  If 1 week into sx w/o relief, then would start doxy and f/u if not better.  Thanks.

## 2023-08-07 NOTE — Addendum Note (Signed)
Addended by: Joaquim Nam on: 08/07/2023 02:11 PM   Modules accepted: Orders

## 2023-08-07 NOTE — Telephone Encounter (Signed)
  Chief Complaint: cold Symptoms: nasal/chest congestion, feet cold, watery eyes, weakness, body aches, cough-productive,  Frequency: worsening since Monday Pertinent Negatives: Patient denies fever Disposition: [] ED /[] Urgent Care (no appt availability in office) / [] Appointment(In office/virtual)/ []  [place] [facility]/ [] Home Care/ [] Refused Recommended Disposition /[] [place] [facility]/ [x]  Follow-up with PCP Additional Notes: Pt stated video appt with PCP on Monday instructed to get tested for COVID & flu: COVID negative: was not able to do flu test b/c not available.  Pt stated head seems to have a cloudiness in her head.  Pt would like to request RX to help get over this because she feels worse now than on Monday.  If someone could let pt know if prescription would be called in.   Reason for Disposition  Common cold with no complications  Answer Assessment - Initial Assessment Questions 1. ONSET: "When did the nasal discharge start?"      Monday had video appt and s/s worsening 2. AMOUNT: "How much discharge is there?"      moderate 3. COUGH: "Do you have a cough?" If Yes, ask: "Describe the color of your sputum" (clear, white, yellow, green)     Yellow-thick 4. RESPIRATORY DISTRESS: "Describe your breathing."      no 5. FEVER: "Do you have a fever?" If Yes, ask: "What is your temperature, how was it measured, and when did it start?"     no 6. SEVERITY: "Overall, how bad are you feeling right now?" (e.g., doesn't interfere with normal activities, staying home from school/work, staying in bed)      Severe  7. OTHER SYMPTOMS: "Do you have any other symptoms?" (e.g., sore throat, earache, wheezing, vomiting)     no 8. PREGNANCY: "Is there any chance you are pregnant?" "When was your last menstrual period?"     N/a  Protocols used: Common Cold-A-AH

## 2023-08-07 NOTE — Telephone Encounter (Signed)
Patient notified

## 2023-09-24 ENCOUNTER — Ambulatory Visit: Payer: Self-pay | Admitting: Family Medicine

## 2023-09-24 ENCOUNTER — Other Ambulatory Visit: Payer: Self-pay

## 2023-09-24 MED ORDER — VERAPAMIL HCL ER 240 MG PO TBCR: 240.0000 mg | EXTENDED_RELEASE_TABLET | Freq: Every day | ORAL | 0 refills | Status: DC

## 2023-09-24 NOTE — Telephone Encounter (Signed)
  Chief Complaint: tick bite Symptoms: redness and itching and lump on abd Frequency: last night Pertinent Negatives: Patient denies fever Disposition: [] ED /[] Urgent Care (no appt availability in office) / [x] Appointment(In office/virtual)/ []  [place] [facility]/ [] Home Care/ [] Refused Recommended Disposition /[] [place] [facility]/ []  Follow-up with PCP Additional Notes: Patietn states that she was gardening on Sunday and Monday and then last night she noticed a tick to her abd. She removed the tick and burned it.  Pt states the tick was small and still flat.   Copied from CRM [id number]. Topic: Clinical - Red Word Triage >> Sep 24, 2023  8:49 AM [name] wrote: Red Word that prompted transfer to Nurse Triage: Patient found tick on stomach, now the area is red, itchy, sore and has a lump Reason for Disposition  [1] Red or very tender (to touch) area AND [2] started over 24 hours after the bite  Answer Assessment - Initial Assessment Questions 1. ATTACHED:  "Is the tick still on the skin?"  (e.g., yes, no, unsure) no 2. ONSET - TICK STILL ATTACHED:  "How long do you think the tick has been on your skin?" (e.g., hours, days, unsure)  Note:  Is there a recent activity (camping, hiking) where the caller may have been exposed?  3. ONSET - TICK NOT STILL ATTACHED: "If the tick has been removed, how long do you think the tick was attached before you removed it?" (e.g., 5 hours, 2 days). "When was this?"     Working in garden Sunday and Monday and found tick yesterday 4. LOCATION: "Where is the tick bite located?" (e.g., arm, leg)     Left abd  close to navel 5. TYPE of TICK: "Is it a wood tick or a deer tick?" (e.g., deer tick, wood tick; unsure)     unsure 6. SIZE of TICK: "How big is the tick?" (e.g., size of poppy seed, apple seed, watermelon seed; unsure) Note: Deer ticks can be the size of a poppy seed (nymph) or an apple seed (adult).       Poppy seed 7. ENGORGED: "Did the tick look  flat or engorged (full, swollen)?" (e.g., flat, engorged; unsure)     flat 8. OTHER SYMPTOMS: "Do you have any other symptoms?" (e.g., fever, rash, redness at bite area, red ring around bite)  Protocols used: Tick Bite-A-AH

## 2023-09-24 NOTE — Telephone Encounter (Signed)
Will see at OV.  Thanks.  

## 2023-09-25 ENCOUNTER — Ambulatory Visit (INDEPENDENT_AMBULATORY_CARE_PROVIDER_SITE_OTHER): Admitting: Family Medicine

## 2023-09-25 ENCOUNTER — Encounter: Payer: Self-pay | Admitting: Family Medicine

## 2023-09-25 VITALS — BP 124/82 | HR 66 | Temp 98.9°F | Ht 65.0 in | Wt 186.4 lb

## 2023-09-25 DIAGNOSIS — R198 Other specified symptoms and signs involving the digestive system and abdomen: Secondary | ICD-10-CM | POA: Diagnosis not present

## 2023-09-25 DIAGNOSIS — R140 Abdominal distension (gaseous): Secondary | ICD-10-CM

## 2023-09-25 DIAGNOSIS — E7800 Pure hypercholesterolemia, unspecified: Secondary | ICD-10-CM | POA: Diagnosis not present

## 2023-09-25 DIAGNOSIS — L989 Disorder of the skin and subcutaneous tissue, unspecified: Secondary | ICD-10-CM | POA: Diagnosis not present

## 2023-09-25 LAB — COMPREHENSIVE METABOLIC PANEL
ALT: 30 U/L (ref 0–35)
AST: 30 U/L (ref 0–37)
Albumin: 4.3 g/dL (ref 3.5–5.2)
Alkaline Phosphatase: 54 U/L (ref 39–117)
BUN: 7 mg/dL (ref 6–23)
CO2: 30 meq/L (ref 19–32)
Calcium: 9.9 mg/dL (ref 8.4–10.5)
Chloride: 104 meq/L (ref 96–112)
Creatinine, Ser: 0.69 mg/dL (ref 0.40–1.20)
GFR: 85.43 mL/min (ref >60.00)
Glucose, Bld: 88 mg/dL (ref 70–99)
Potassium: 4.5 meq/L (ref 3.5–5.1)
Sodium: 138 meq/L (ref 135–145)
Total Bilirubin: 0.5 mg/dL (ref 0.2–1.2)
Total Protein: 7.6 g/dL (ref 6.0–8.3)

## 2023-09-25 LAB — CBC WITH DIFFERENTIAL/PLATELET
Basophils Absolute: 0 10*3/uL (ref 0.0–0.1)
Basophils Relative: 1 % (ref 0.0–3.0)
Eosinophils Absolute: 0.2 10*3/uL (ref 0.0–0.7)
Eosinophils Relative: 4 % (ref 0.0–5.0)
HCT: 39 % (ref 36.0–46.0)
Hemoglobin: 12.6 g/dL (ref 12.0–15.0)
Lymphocytes Relative: 25.7 % (ref 12.0–46.0)
Lymphs Abs: 1.1 10*3/uL (ref 0.7–4.0)
MCHC: 32.4 g/dL (ref 30.0–36.0)
MCV: 84.6 fl (ref 78.0–100.0)
Monocytes Absolute: 0.3 10*3/uL (ref 0.1–1.0)
Monocytes Relative: 7.9 % (ref 3.0–12.0)
Neutro Abs: 2.6 10*3/uL (ref 1.4–7.7)
Neutrophils Relative %: 61.4 % (ref 43.0–77.0)
Platelets: 220 10*3/uL (ref 150.0–400.0)
RBC: 4.61 Mil/uL (ref 3.87–5.11)
RDW: 13.9 % (ref 11.5–15.5)
WBC: 4.2 10*3/uL (ref 4.0–10.5)

## 2023-09-25 LAB — LIPID PANEL
Cholesterol: 138 mg/dL (ref 0–200)
HDL: 46.9 mg/dL (ref >39.00)
LDL Cholesterol: 70 mg/dL (ref 0–99)
NonHDL: 91.36
Total CHOL/HDL Ratio: 3
Triglycerides: 106 mg/dL (ref 0.0–149.0)
VLDL: 21.2 mg/dL (ref 0.0–40.0)

## 2023-09-25 MED ORDER — DOXYCYCLINE HYCLATE 100 MG PO TABS: 100.0000 mg | ORAL_TABLET | Freq: Two times a day (BID) | ORAL | 0 refills | Status: DC

## 2023-09-25 NOTE — Patient Instructions (Addendum)
 Refer back to the GI clinic.  You should get a call about that.   Take care.  Glad to see you. Keep taking dicyclomine in the meantime.  Go to the lab on the way out.   If you have mychart we'll likely use that to update you.    If you have spreading redness or a fever, then start doxy and let us know.   Use hydrocortisone cream if needed for itching.

## 2023-09-25 NOTE — Progress Notes (Signed)
 Was working in the garden then found the tick 2 days later- found on 09/23/23.  It wasn't engorged.  Removed, local irritation.  L lower abd.  Locally itchy.  No spreading erythema.    Dicyclomine helped with abd sx but still with bloating noted.  She had more need for use of dicyclomine.  More discomfort on the lateral (R and L) side of abd at night.  No blood in stool.  Had colonoscopy 2022.  Taking metamucil at baseline and that helped with regularity.  She feels better after BM.  More sx with eating red meat, peanut butter.  Dairy doesn't seen to affect her, ie eating cheese.    D/w pt about getting lipids done this AM.  Still on statin.  No ADE on med.  No aches on crestor.    She has noted occ memory lapses, with slower recall.  She isn't getting lost but she has to think about directions more.  D/w pt about concentration vs memory, she attributed it more to concentration.  D/w pt about options.  She isn't working now and that is a change.  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  She is taking a software class in the meantime.  Her son is going to move in with patient, along with pt's grandson.  Pt's ex husband is living with her son- he is also going to move in at the same time.  Discussed.  I asked her to update me as needed.  I do not suspect an ominous memory loss.  Meds, vitals, and allergies reviewed.   ROS: Per HPI unless specifically indicated in ROS section   GEN: nad, alert and oriented HEENT: ncat NECK: supple w/o LA CV: rrr.  PULM: ctab, no inc wob ABD: soft, +bs EXT: no edema SKIN: Minimal irritation at 2 presumed small bite sites on the left lower quadrant.  No spreading erythema.  No fluctuant mass.  No ulceration.  30 minutes were devoted to patient care in this encounter (this includes time spent reviewing the patient's file/history, interviewing and examining the patient, counseling/reviewing plan with patient).

## 2023-09-28 ENCOUNTER — Encounter: Payer: Self-pay | Admitting: Family Medicine

## 2023-09-28 DIAGNOSIS — L989 Disorder of the skin and subcutaneous tissue, unspecified: Secondary | ICD-10-CM | POA: Insufficient documentation

## 2023-09-28 NOTE — Assessment & Plan Note (Signed)
 D/w pt about getting lipids done this AM.  Still on statin.  No ADE on med.  No aches on crestor.

## 2023-09-28 NOTE — Assessment & Plan Note (Signed)
 Continue dicyclomine but refer back to GI in the meantime.

## 2023-09-28 NOTE — Assessment & Plan Note (Signed)
 Presumed local irritation from tick bite.  Benign exam.  Routine cautions given to patient. If spreading redness or a fever, then start doxy and let us know.   Use hydrocortisone cream if needed for itching.

## 2023-11-13 NOTE — Progress Notes (Signed)
 Patricia Canard, PA-C [address] [place], [place]  [address] Phone: [phone]   Primary Care Physician: Patricia Galea, MD  Primary Gastroenterologist:  Patricia Canard, PA-C / Patricia Johnson, MD   Chief Complaint:  [name] Abdominal pain       HPI:   [name] is a 73 y.o. female presents for evaluation of Increaseing LLQ pain for a few weeks.  Incerased bowel sounds.  Occasional Constipation.  Hx IBS for many years.  Patient states she is having increasing pulsating sharp pain in her left side and left lower quadrant.  Typically has bowel movement daily or every other day.  No bowel movement yesterday.  She strained for BM today.  She denies any rectal bleeding.  Has abdominal bloating and swelling.  Has been on Metamucil for many years.  Also takes dicyclomine  which helps with abdominal cramping.  She denies weight loss.  Last Abd / Pelvic CT 01/2023: showed mild uncomplicated sigmoid diverticulitis with no perforation or abscess.  Resolved with antibiotics.  Patient has history of chronic abdominal pain.  Last saw Dr. General [name] for abdominal pain in 2016.  Has had upper abdominal pain ongoing since at least 2006.  Remote history of hepatitis C without cirrhosis treated with Harvoni 05/2014.  HCV eradicated.  Cholecystectomy 2012.  Her father had colon cancer diagnosed age 27s.   08/2020 colonoscopy by Dr. General [name]: 2 small (3 mm, 4 mm) adenomatous polyps removed.  Medium lipoma in the transverse colon.  Diverticulosis and internal hemorrhoids.  Adequate prep.  5-year repeat (due 08/2025) pending health status.  04/2015 EGD by Dr. General [name]: Normal.  Biopsies negative for H. Pylori and celiac.  04/2015 colonoscopy: 2 small hyperplastic rectal polyps removed.  07/2010 colonoscopy: Normal.  Current Outpatient Medications  Medication Sig Dispense Refill   cholecalciferol (VITAMIN D3) 25 MCG (1000 UNIT) tablet Take 1,000 Units by mouth daily.     Coenzyme Q10 (CO Q  10 PO) Take by mouth.     dicyclomine  (BENTYL ) 20 MG tablet TAKE ONE TABLET BY MOUTH THREE TIMES DAILY BEFORE meals 90 tablet 1   estradiol  (ESTRACE ) 0.1 MG/GM vaginal cream APPLY A PEA SIZED AMOUNT TO AFFECTED AREA TWICE A WEEK AS NEEDED 42.5 g 0   loratadine  (CLARITIN ) 10 MG tablet Take 10 mg by mouth daily.     omega-3 acid ethyl esters (LOVAZA) 1 g capsule Take by mouth 2 (two) times daily.     psyllium (METAMUCIL) 58.6 % packet Take 1 packet by mouth daily.     rosuvastatin  (CRESTOR ) 20 MG tablet Take 1 tablet (20 mg total) by mouth daily. 90 tablet 1   verapamil  (CALAN -SR) 240 MG CR tablet Take 1 tablet (240 mg total) by mouth daily. 90 tablet 0   No current facility-administered medications for this visit.    Allergies as of 11/14/2023 - Review Complete 11/14/2023  Allergen Reaction Noted   Morphine sulfate Swelling 09/29/2006   Penicillins Swelling 09/29/2006    Past Medical History:  Diagnosis Date   Allergy    Anemia    age 20    Diverticulosis    GERD (gastroesophageal reflux disease)    occasional; OTC as needed   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   Hyperlipidemia    Hypertrophic cardiomyopathy (HCC)    Palpitations    Trigger finger of left hand 09/2012   left long finger    Past Surgical History:  Procedure Laterality  Date   BREAST REDUCTION SURGERY Bilateral 2001   CHOLECYSTECTOMY  07/17/2011   Procedure: LAPAROSCOPIC CHOLECYSTECTOMY WITH INTRAOPERATIVE CHOLANGIOGRAM;  Surgeon: Darcella Earnest, MD;  Location: [facility] OR;  Service: General;  Laterality: N/A;   COLONOSCOPY     FOOT GANGLION EXCISION Right    HEMORRHOID SURGERY  1980   LACERATION REPAIR Right 1996   thumb   MYOMECTOMY  10/09/99   x 2   OOPHORECTOMY Right 1980   OOPHORECTOMY Left 2000   OVARIAN CYST DRAINAGE  1980   PARTIAL HYSTERECTOMY  2002   with no remaining ovary   POLYPECTOMY     TRIGGER FINGER RELEASE Left 09/28/2012   Procedure: RELEASE TRIGGER FINGER/A-1 PULLEY;   Surgeon: Sheryl Donna, MD;  Location: [facility];  Service: Orthopedics;  Laterality: Left;   UPPER GASTROINTESTINAL ENDOSCOPY      Review of Systems:    All systems reviewed and negative except where noted in HPI.    Physical Exam:  BP 124/70   Pulse 70   Ht 5\' 5"  (1.651 m)   Wt 184 lb (83.5 kg)   SpO2 97%   BMI 30.62 kg/m  No LMP recorded. Patient is postmenopausal.  General: Well-nourished, well-developed in no acute distress.  Lungs: Clear to auscultation bilaterally. Non-labored. Heart: Regular rate and rhythm, no murmurs rubs or gallops.  Abdomen: Bowel sounds are normal; Abdomen is Soft; No hepatosplenomegaly, masses or hernias; mild to moderate LLQ abdominal Tenderness; rest of abdomen is not tender.  No guarding or rebound tenderness. Neuro: Alert and oriented x 3.  Grossly intact.  Psych: Alert and cooperative, normal mood and affect.   Imaging Studies: No results found.  Labs: CBC    Component Value Date/Time   WBC 4.2 09/25/2023 0948   RBC 4.61 09/25/2023 0948   HGB 12.6 09/25/2023 0948   HCT 39.0 09/25/2023 0948   PLT 220.0 09/25/2023 0948   MCV 84.6 09/25/2023 0948   MCH 27.6 01/30/2023 0122   MCHC 32.4 09/25/2023 0948   RDW 13.9 09/25/2023 0948   LYMPHSABS 1.1 09/25/2023 0948   MONOABS 0.3 09/25/2023 0948   EOSABS 0.2 09/25/2023 0948   BASOSABS 0.0 09/25/2023 0948    CMP     Component Value Date/Time   NA 138 09/25/2023 0948   K 4.5 09/25/2023 0948   CL 104 09/25/2023 0948   CO2 30 09/25/2023 0948   GLUCOSE 88 09/25/2023 0948   BUN 7 09/25/2023 0948   CREATININE 0.69 09/25/2023 0948   CREATININE 0.61 09/26/2011 1538   CALCIUM  9.9 09/25/2023 0948   PROT 7.6 09/25/2023 0948   ALBUMIN 4.3 09/25/2023 0948   AST 30 09/25/2023 0948   ALT 30 09/25/2023 0948   ALKPHOS 54 09/25/2023 0948   BILITOT 0.5 09/25/2023 0948   GFRNONAA >60 01/30/2023 0122   GFRNONAA >89 09/26/2011 1538   GFRAA >60 06/09/2018 1324   GFRAA >89  09/26/2011 1538       Assessment and Plan:   SHONTAL HIRN is a 73 y.o. y/o female presents for:  LLQ Abdominal Pain x 2 weeks; concerning for acute diverticulitis  Diverticulitis - Rx Cipro  500 Mg twice daily x 10 days, #20, no refills. - Rx Flagyl  500 Mg twice daily x 10 days, #20 no refills. - Soft low fiber diet until LLQ pain resolves. - She is instructed not to drink any alcohol while on Flagyl  due to disulfiram reaction. - If pain does not improve on antibiotics, then repeat abdominal  pelvic CT is the next step.  Irritable Bowel Syndrome, constipation predominant - Low FODMAP diet given and discussed. - Start MiraLAX 1 capful in a drink once daily.  Patricia Canard, PA-C  Follow up office visit in 2 weeks with TG for diverticulitis.

## 2023-11-14 ENCOUNTER — Ambulatory Visit: Admitting: Physician Assistant

## 2023-11-14 ENCOUNTER — Encounter: Payer: Self-pay | Admitting: Physician Assistant

## 2023-11-14 VITALS — BP 124/70 | HR 70 | Ht 65.0 in | Wt 184.0 lb

## 2023-11-14 DIAGNOSIS — K5792 Diverticulitis of intestine, part unspecified, without perforation or abscess without bleeding: Secondary | ICD-10-CM

## 2023-11-14 DIAGNOSIS — K5732 Diverticulitis of large intestine without perforation or abscess without bleeding: Secondary | ICD-10-CM

## 2023-11-14 DIAGNOSIS — K581 Irritable bowel syndrome with constipation: Secondary | ICD-10-CM | POA: Diagnosis not present

## 2023-11-14 DIAGNOSIS — R1032 Left lower quadrant pain: Secondary | ICD-10-CM

## 2023-11-14 DIAGNOSIS — K589 Irritable bowel syndrome without diarrhea: Secondary | ICD-10-CM

## 2023-11-14 MED ORDER — METRONIDAZOLE 500 MG PO TABS: 500.0000 mg | ORAL_TABLET | Freq: Two times a day (BID) | ORAL | 0 refills | Status: DC

## 2023-11-14 MED ORDER — CIPROFLOXACIN HCL 500 MG PO TABS: 500.0000 mg | ORAL_TABLET | Freq: Two times a day (BID) | ORAL | 0 refills | Status: DC

## 2023-11-14 NOTE — Progress Notes (Signed)
 Agree with assessment and plan as outlined.

## 2023-11-14 NOTE — Patient Instructions (Addendum)
 We have sent the following medications to your pharmacy for you to pick up at your convenience: Cipro  500 mg twice daily and Flagyl  500 mg twice daily  Please follow up sooner if symptoms increase or worsen  Due to recent changes in healthcare laws, you may see the results of your imaging and laboratory studies on MyChart before your provider has had a chance to review them.  We understand that in some cases there may be results that are confusing or concerning to you. Not all laboratory results come back in the same time frame and the provider may be waiting for multiple results in order to interpret others.  Please give us  48 hours in order for your provider to thoroughly review all the results before contacting the office for clarification of your results.   _______________________________________________________  If your blood pressure at your visit was 140/90 or greater, please contact your primary care physician to follow up on this.  _______________________________________________________  If you are age 75 or older, your body mass index should be between 23-30. Your Body mass index is 30.62 kg/m. If this is out of the aforementioned range listed, please consider follow up with your Primary Care Provider.  If you are age 75 or younger, your body mass index should be between 19-25. Your Body mass index is 30.62 kg/m. If this is out of the aformentioned range listed, please consider follow up with your Primary Care Provider.   ________________________________________________________  The [place] [facility] providers would like to encourage you to use MYCHART to communicate with providers for non-urgent requests or questions.  Due to long hold times on the telephone, sending your provider a message by [facility] may be a faster and more efficient way to get a response.  Please allow 48 business hours for a response.  Please remember that this is for non-urgent requests.   _______________________________________________________ Thank you for trusting me with your gastrointestinal care!   [name], PA

## 2023-11-26 ENCOUNTER — Encounter: Payer: Self-pay | Admitting: Cardiology

## 2023-11-27 NOTE — Progress Notes (Signed)
 [name], PA-C [address] [place], [place]  [address] Phone: [phone]   Primary Care Physician: [name], MD  Primary Gastroenterologist:  [name], PA-C / [name], MD   Chief Complaint:  F/U LLQ Abdominal Pain, Diverticulitis, IBS-C       HPI:   [name] is a 73 y.o. female returns for 2-week follow-up of LLQ pain and acute diverticulitis.  2 weeks ago she was empirically started on Cipro  500 Mg twice daily x 10 days and Flagyl  500 Mg twice daily x 10 days to treat presumed diverticulitis.  Also told to start  MiraLAX 1 capful daily for constipation.  History of IBS-C.    Current Symptoms: She finished all antibiotics.  She did not start Miralax.  She is taking Metamucil daily.  Currently still having LLQ pain (4 / 10 on pain scale).  Last night she had some RLQ pain and Black loose stool.  Her bloating is better.  She denies use of Pepto Bismol or Iron.  She is afraid of having cancer.  Her father had colon cancer.  Patient denies weight loss or bright red rectal bleeding.    Last Abd / Pelvic CT 01/2023: showed mild uncomplicated sigmoid diverticulitis with no perforation or abscess.  Resolved with antibiotics.    08/2020 colonoscopy by Dr. General Kenner: 2 small (3 mm, 4 mm) adenomatous polyps removed.  Medium lipoma in the transverse colon.  Diverticulosis and internal hemorrhoids.  Adequate prep.  5-year repeat (due 08/2025) pending health status.  Her father had colon cancer diagnosed age 78s.    04/2015 EGD by Dr. General Kenner: Normal.  Biopsies negative for H. Pylori and celiac.   04/2015 colonoscopy: 2 small hyperplastic rectal polyps removed.   07/2010 colonoscopy: Normal.  Current Outpatient Medications  Medication Sig Dispense Refill   cholecalciferol (VITAMIN D3) 25 MCG (1000 UNIT) tablet Take 1,000 Units by mouth daily.     Coenzyme Q10 (CO Q 10 PO) Take by mouth.     dicyclomine  (BENTYL ) 20 MG tablet TAKE ONE TABLET BY MOUTH  THREE TIMES DAILY BEFORE meals 90 tablet 1   estradiol  (ESTRACE ) 0.1 MG/GM vaginal cream APPLY A PEA SIZED AMOUNT TO AFFECTED AREA TWICE A WEEK AS NEEDED 42.5 g 0   loratadine  (CLARITIN ) 10 MG tablet Take 10 mg by mouth daily.     omega-3 acid ethyl esters (LOVAZA) 1 g capsule Take by mouth 2 (two) times daily.     psyllium (METAMUCIL) 58.6 % packet Take 1 packet by mouth daily.     rosuvastatin  (CRESTOR ) 20 MG tablet Take 1 tablet (20 mg total) by mouth daily. 90 tablet 1   verapamil  (CALAN -SR) 240 MG CR tablet Take 1 tablet (240 mg total) by mouth daily. 90 tablet 0   No current facility-administered medications for this visit.    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   Hyperlipidemia    Hypertrophic cardiomyopathy (HCC)    Palpitations    Trigger finger of left hand 09/2012   left long finger    Past Surgical  History:  Procedure Laterality Date   BREAST REDUCTION SURGERY Bilateral 2001   CHOLECYSTECTOMY  07/17/2011   Procedure: LAPAROSCOPIC CHOLECYSTECTOMY WITH INTRAOPERATIVE CHOLANGIOGRAM;  Surgeon: Darcella Earnest, MD;  Location: [facility] OR;  Service: General;  Laterality: N/A;   COLONOSCOPY     FOOT GANGLION EXCISION Right    HEMORRHOID SURGERY  1980   LACERATION REPAIR Right 1996   thumb   MYOMECTOMY  10/09/99   x 2   OOPHORECTOMY Right 1980   OOPHORECTOMY Left 2000   OVARIAN CYST DRAINAGE  1980   PARTIAL HYSTERECTOMY  2002   with no remaining ovary   POLYPECTOMY     TRIGGER FINGER RELEASE Left 09/28/2012   Procedure: RELEASE TRIGGER FINGER/A-1 PULLEY;  Surgeon: Sheryl Donna, MD;  Location: [facility];  Service: Orthopedics;   Laterality: Left;   UPPER GASTROINTESTINAL ENDOSCOPY      Review of Systems:    All systems reviewed and negative except where noted in HPI.    Physical Exam:  BP 124/72   Pulse 85   Ht 5\' 5"  (1.651 m)   Wt 180 lb (81.6 kg)   BMI 29.95 kg/m  No LMP recorded. Patient is postmenopausal.  General: Well-nourished, well-developed in no acute distress.  Lungs: Clear to auscultation bilaterally. Non-labored. Heart: Regular rate and rhythm, no murmurs rubs or gallops.  Abdomen: Bowel sounds are normal; Abdomen is Soft; No hepatosplenomegaly, masses or hernias;  Moderate LLQ Abdominal Tenderness; Rest of abdomen is not tender.  No guarding or rebound tenderness. Neuro: Alert and oriented x 3.  Grossly intact.  Psych: Alert and cooperative, normal mood and affect.   Imaging Studies: No results found.  Labs: CBC    Component Value Date/Time   WBC 4.2 09/25/2023 0948   RBC 4.61 09/25/2023 0948   HGB 12.6 09/25/2023 0948   HCT 39.0 09/25/2023 0948   PLT 220.0 09/25/2023 0948   MCV 84.6 09/25/2023 0948   MCH 27.6 01/30/2023 0122   MCHC 32.4 09/25/2023 0948   RDW 13.9 09/25/2023 0948   LYMPHSABS 1.1 09/25/2023 0948   MONOABS 0.3 09/25/2023 0948   EOSABS 0.2 09/25/2023 0948   BASOSABS 0.0 09/25/2023 0948    CMP     Component Value Date/Time   NA 138 09/25/2023 0948   K 4.5 09/25/2023 0948   CL 104 09/25/2023 0948   CO2 30 09/25/2023 0948   GLUCOSE 88 09/25/2023 0948   BUN 7 09/25/2023 0948   CREATININE 0.69 09/25/2023 0948   CREATININE 0.61 09/26/2011 1538   CALCIUM  9.9 09/25/2023 0948   PROT 7.6 09/25/2023 0948   ALBUMIN 4.3 09/25/2023 0948   AST 30 09/25/2023 0948   ALT 30 09/25/2023 0948   ALKPHOS 54 09/25/2023 0948   BILITOT 0.5 09/25/2023 0948   GFRNONAA >60 01/30/2023 0122   GFRNONAA >89 09/26/2011 1538   GFRAA >60 06/09/2018 1324   GFRAA >89 09/26/2011 1538       Assessment and Plan:   [name] is a 73 y.o. y/o female returns for follow-up  of LLQ pain and diverticulitis.  She finished Cipro  and Flagyl  for 10  Days with little benefit.  She did not start Miralax that was recommended.  She is taking Metamucil.  She is fearful of having Cancer.  Her father had colon cancer.  1.  Persistent LLQ Pain: Evaluate for Diverticulitis verses Constipation - Labs: CBC, CMP - Schedule Abdominal / Pelvic CT scan with Contrast: Rule out Diverticulitis, perforation or abscess.  2.  Irritable bowel syndrome, constipation predominant - I asked her to please start OTC MiraLAX 17g, 1 capful every day for Constipation. - Continue Metamucil daily. - Drink 64 ounces of water / fluids daily.  3.  Dark Stools - Gave her Hemoccult Cards to complete. - Avoid Pepto Bismol.  4.  History of adenomatous colon polyp and family history of father with colon cancer - 5-year repeat colonoscopy will be due 08/2025.  [name], PA-C  Follow up 6 weeks with TG.

## 2023-11-28 ENCOUNTER — Other Ambulatory Visit (INDEPENDENT_AMBULATORY_CARE_PROVIDER_SITE_OTHER)

## 2023-11-28 ENCOUNTER — Ambulatory Visit: Payer: Self-pay | Admitting: Physician Assistant

## 2023-11-28 ENCOUNTER — Encounter: Payer: Self-pay | Admitting: Physician Assistant

## 2023-11-28 ENCOUNTER — Ambulatory Visit: Admitting: Physician Assistant

## 2023-11-28 VITALS — BP 124/72 | HR 85 | Ht 65.0 in | Wt 180.0 lb

## 2023-11-28 DIAGNOSIS — Z860101 Personal history of adenomatous and serrated colon polyps: Secondary | ICD-10-CM

## 2023-11-28 DIAGNOSIS — R1032 Left lower quadrant pain: Secondary | ICD-10-CM | POA: Diagnosis not present

## 2023-11-28 DIAGNOSIS — K581 Irritable bowel syndrome with constipation: Secondary | ICD-10-CM | POA: Diagnosis not present

## 2023-11-28 DIAGNOSIS — K921 Melena: Secondary | ICD-10-CM

## 2023-11-28 DIAGNOSIS — K589 Irritable bowel syndrome without diarrhea: Secondary | ICD-10-CM

## 2023-11-28 DIAGNOSIS — Z800 Family history of malignant neoplasm of digestive organs: Secondary | ICD-10-CM | POA: Diagnosis not present

## 2023-11-28 DIAGNOSIS — K5900 Constipation, unspecified: Secondary | ICD-10-CM

## 2023-11-28 DIAGNOSIS — R911 Solitary pulmonary nodule: Secondary | ICD-10-CM

## 2023-11-28 LAB — CBC WITH DIFFERENTIAL/PLATELET
Basophils Absolute: 0 10*3/uL (ref 0.0–0.1)
Basophils Relative: 0.9 % (ref 0.0–3.0)
Eosinophils Absolute: 0.2 10*3/uL (ref 0.0–0.7)
Eosinophils Relative: 5.8 % — ABNORMAL HIGH (ref 0.0–5.0)
HCT: 40.5 % (ref 36.0–46.0)
Hemoglobin: 13.3 g/dL (ref 12.0–15.0)
Lymphocytes Relative: 36.5 % (ref 12.0–46.0)
Lymphs Abs: 1.4 10*3/uL (ref 0.7–4.0)
MCHC: 32.8 g/dL (ref 30.0–36.0)
MCV: 81.7 fl (ref 78.0–100.0)
Monocytes Absolute: 0.4 10*3/uL (ref 0.1–1.0)
Monocytes Relative: 9.9 % (ref 3.0–12.0)
Neutro Abs: 1.9 10*3/uL (ref 1.4–7.7)
Neutrophils Relative %: 46.9 % (ref 43.0–77.0)
Platelets: 219 10*3/uL (ref 150.0–400.0)
RBC: 4.96 Mil/uL (ref 3.87–5.11)
RDW: 13.9 % (ref 11.5–15.5)
WBC: 4 10*3/uL (ref 4.0–10.5)

## 2023-11-28 LAB — COMPREHENSIVE METABOLIC PANEL WITH GFR
ALT: 58 U/L — ABNORMAL HIGH (ref 0–35)
AST: 53 U/L — ABNORMAL HIGH (ref 0–37)
Albumin: 4.5 g/dL (ref 3.5–5.2)
Alkaline Phosphatase: 64 U/L (ref 39–117)
BUN: 10 mg/dL (ref 6–23)
CO2: 30 meq/L (ref 19–32)
Calcium: 10.1 mg/dL (ref 8.4–10.5)
Chloride: 105 meq/L (ref 96–112)
Creatinine, Ser: 0.65 mg/dL (ref 0.40–1.20)
GFR: 86.56 mL/min (ref >60.00)
Glucose, Bld: 104 mg/dL — ABNORMAL HIGH (ref 70–99)
Potassium: 3.8 meq/L (ref 3.5–5.1)
Sodium: 141 meq/L (ref 135–145)
Total Bilirubin: 0.4 mg/dL (ref 0.2–1.2)
Total Protein: 8 g/dL (ref 6.0–8.3)

## 2023-11-28 NOTE — Patient Instructions (Signed)
 _______________________________________________________  If your blood pressure at your visit was 140/90 or greater, please contact your primary care physician to follow up on this. _______________________________________________________  If you are age 75 or older, your body mass index should be between 23-30. Your Body mass index is 29.95 kg/m. If this is out of the aforementioned range listed, please consider follow up with your Primary Care Provider. _______________________________________________________  The [facility] providers would like to encourage you to use MYCHART to communicate with providers for non-urgent requests or questions.  Due to long hold times on the telephone, sending your provider a message by [facility] may be a faster and more efficient way to get a response.  Please allow 48 business hours for a response.  Please remember that this is for non-urgent requests.  _______________________________________________________  Your provider has requested that you go to the basement level for lab work before leaving today. Press "B" on the elevator. The lab is located at the first door on the left as you exit the elevator.  You have been scheduled for a CT scan of the abdomen and pelvis at [facility], 1st floor Radiology. You are scheduled on 12-05-23 at 2:30pm. You should arrive at 12:15pm. Please follow the written instructions below on the day of your exam:   1) Do not eat anything after 10:30am (4 hours prior to your test)   You may take any medications as prescribed with a small amount of water, if necessary. If you take any of the following medications: METFORMIN, GLUCOPHAGE, GLUCOVANCE, AVANDAMET, RIOMET, FORTAMET, ACTOPLUS MET, JANUMET, GLUMETZA or METAGLIP, you MAY be asked to HOLD this medication 48 hours AFTER the exam.   The purpose of you drinking the oral contrast is to aid in the visualization of your intestinal tract. The contrast solution may cause some  diarrhea. Depending on your individual set of symptoms, you may also receive an intravenous injection of x-ray contrast/dye. Plan on being at [facility] for 45 minutes or longer, depending on the type of exam you are having performed.   If you have any questions regarding your exam or if you need to reschedule, you may call [facility] at [phone] between the hours of 8:00 am and 5:00 pm, Monday-Friday.   Due to recent changes in healthcare laws, you may see the results of your imaging and laboratory studies on MyChart before your provider has had a chance to review them.  We understand that in some cases there may be results that are confusing or concerning to you. Not all laboratory results come back in the same time frame and the provider may be waiting for multiple results in order to interpret others.  Please give us  48 hours in order for your provider to thoroughly review all the results before contacting the office for clarification of your results.   Thank you for entrusting me with your care and choosing [facility].  [name], PA-C

## 2023-12-01 NOTE — Progress Notes (Signed)
 Agree with assessment and plan as outlined.

## 2023-12-05 ENCOUNTER — Ambulatory Visit (HOSPITAL_COMMUNITY)
Admission: RE | Admit: 2023-12-05 | Discharge: 2023-12-05 | Disposition: A | Discharge status: Home / Self Care | Source: Ambulatory Visit | Attending: Physician Assistant | Admitting: Physician Assistant

## 2023-12-05 ENCOUNTER — Encounter (HOSPITAL_COMMUNITY): Payer: Self-pay

## 2023-12-05 DIAGNOSIS — R1032 Left lower quadrant pain: Secondary | ICD-10-CM

## 2023-12-05 MED ORDER — IOHEXOL 300 MG/ML  SOLN: 100.0000 mL | Freq: Once | INTRAMUSCULAR | Status: DC | PRN

## 2023-12-05 MED ORDER — IOHEXOL 9 MG/ML PO SOLN
ORAL | Status: AC
  Filled 2023-12-05: qty 1000

## 2023-12-05 MED ORDER — IOHEXOL 9 MG/ML PO SOLN
1000.0000 mL | ORAL | Status: AC
  Administered 2023-12-05: 1000 mL via ORAL

## 2023-12-06 ENCOUNTER — Other Ambulatory Visit: Payer: Self-pay | Admitting: Family Medicine

## 2023-12-09 ENCOUNTER — Other Ambulatory Visit (INDEPENDENT_AMBULATORY_CARE_PROVIDER_SITE_OTHER)

## 2023-12-09 DIAGNOSIS — K921 Melena: Secondary | ICD-10-CM

## 2023-12-10 LAB — HEMOCCULT SLIDES (X 3 CARDS)
Fecal Occult Blood: NEGATIVE
OCCULT 1: NEGATIVE
OCCULT 2: NEGATIVE
OCCULT 3: NEGATIVE
OCCULT 4: NEGATIVE
OCCULT 5: NEGATIVE

## 2023-12-16 ENCOUNTER — Ambulatory Visit: Admitting: Family Medicine

## 2023-12-24 ENCOUNTER — Encounter: Payer: Self-pay | Admitting: Internal Medicine

## 2023-12-24 ENCOUNTER — Ambulatory Visit (INDEPENDENT_AMBULATORY_CARE_PROVIDER_SITE_OTHER): Admitting: Internal Medicine

## 2023-12-24 VITALS — BP 130/80 | HR 62 | Temp 98.6°F | Ht 65.0 in | Wt 183.0 lb

## 2023-12-24 DIAGNOSIS — S86899A Other injury of other muscle(s) and tendon(s) at lower leg level, unspecified leg, initial encounter: Secondary | ICD-10-CM

## 2023-12-24 NOTE — Progress Notes (Signed)
 Subjective:    Patient ID: [name], female    DOB: 05-06-1951, 73 y.o.   MRN: [id number]  HPI Here due to right lower leg pain  Having pain in the anterior right calf--along tibia Started months ago--notices it only if hits along the inside (like hitting it with other leg) No tenderness First noticed some pain in right knee in past couple of days  No injury No new tasks but has walked on treadmill more (did notice after this)  Current Outpatient Medications on File Prior to Visit  Medication Sig Dispense Refill   cholecalciferol (VITAMIN D3) 25 MCG (1000 UNIT) tablet Take 1,000 Units by mouth daily.     Coenzyme Q10 (CO Q 10 PO) Take by mouth.     dicyclomine  (BENTYL ) 20 MG tablet TAKE ONE TABLET BY MOUTH THREE TIMES DAILY BEFORE meals 90 tablet 1   estradiol  (ESTRACE ) 0.1 MG/GM vaginal cream APPLY A PEA SIZED AMOUNT TO AFFECTED AREA TWICE A WEEK AS NEEDED 42.5 g 0   loratadine  (CLARITIN ) 10 MG tablet Take 10 mg by mouth daily.     omega-3 acid ethyl esters (LOVAZA) 1 g capsule Take by mouth 2 (two) times daily.     psyllium (METAMUCIL) 58.6 % packet Take 1 packet by mouth daily.     rosuvastatin  (CRESTOR ) 20 MG tablet Take 1 tablet (20 mg total) by mouth daily. 90 tablet 1   verapamil  (CALAN -SR) 240 MG CR tablet TAKE 1 TABLET EVERY DAY 90 tablet 3   No current facility-administered medications on file prior to visit.    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   Hyperlipidemia    Hypertrophic cardiomyopathy (HCC)    Palpitations    Trigger finger of left hand 09/2012   left long finger    Past Surgical History:  Procedure Laterality Date   BREAST REDUCTION SURGERY Bilateral 2001   CHOLECYSTECTOMY   07/17/2011   Procedure: LAPAROSCOPIC CHOLECYSTECTOMY WITH INTRAOPERATIVE CHOLANGIOGRAM;  Surgeon: Darcella Earnest, MD;  Location: [facility] OR;  Service: General;  Laterality: N/A;   COLONOSCOPY     FOOT GANGLION EXCISION Right    HEMORRHOID SURGERY  1980   LACERATION REPAIR Right 1996   thumb   MYOMECTOMY  10/09/99   x 2   OOPHORECTOMY Right 1980   OOPHORECTOMY Left 2000   OVARIAN CYST DRAINAGE  1980   PARTIAL HYSTERECTOMY  2002   with no remaining ovary   POLYPECTOMY     TRIGGER FINGER RELEASE Left 09/28/2012   Procedure: RELEASE TRIGGER FINGER/A-1 PULLEY;  Surgeon: Sheryl Donna, MD;  Location: [facility];  Service: Orthopedics;  Laterality: Left;   UPPER GASTROINTESTINAL ENDOSCOPY      Family History  Problem Relation Age of Onset   Colon cancer Father 64   Diabetes Sister    Hepatitis C Brother    Hypertension Brother    Hepatitis C Brother    Colon cancer Paternal Aunt        dx'd in her 20's    Breast cancer Paternal Aunt        two aunts   Lung cancer Paternal Uncle  Breast cancer Cousin    Liver cancer Cousin    Colon cancer Other        cousin    Colon polyps Neg Hx    Esophageal cancer Neg Hx    Rectal cancer Neg Hx    Stomach cancer Neg Hx     Social History   Socioeconomic History   Marital status: Divorced    Spouse name: Not on file   Number of children: 2   Years of education: Not on file   Highest education level: Not on file  Occupational History   Occupation: [employer] from home  Tobacco Use   Smoking status: Former    Current packs/day: 0.00    Average packs/day: 2.5 packs/day for 42.0 years (105.0 ttl pk-yrs)    Types: Cigarettes    Start date: 07/08/1963    Quit date: 07/07/2005    Years since quitting: 18.4   Smokeless tobacco: Never   Tobacco comments:    quit smoking 2008  Vaping Use   Vaping status: Never Used  Substance and Sexual Activity   Alcohol use: Not Currently    Comment: rarely   Drug use: Yes     Frequency: 7.0 times per week    Types: Marijuana    Comment: previous hx. of   Sexual activity: Yes    Birth control/protection: Surgical  Other Topics Concern   Not on file  Social History Narrative   Divorced 1982   2 children   Left handed      Retired 2024   Do you live at home alone?yes   Who lives with you?    What type of home do you live in: 1 story or 2 story? two   Caffeine no    Social Drivers of Corporate investment banker Strain: Low Risk  (01/21/2023)   Overall Financial Resource Strain (CARDIA)    Difficulty of Paying Living Expenses: Not hard at all  Food Insecurity: No Food Insecurity (01/21/2023)   Hunger Vital Sign    Worried About Running Out of Food in the Last Year: Never true    Ran Out of Food in the Last Year: Never true  Transportation Needs: No Transportation Needs (01/21/2023)   PRAPARE - Administrator, Civil Service (Medical): No    Lack of Transportation (Non-Medical): No  Physical Activity: Inactive (01/21/2023)   Exercise Vital Sign    Days of Exercise per Week: 0 days    Minutes of Exercise per Session: 0 min  Stress: No Stress Concern Present (01/21/2023)   [employer] of Occupational Health - Occupational Stress Questionnaire    Feeling of Stress : Not at all  Social Connections: Socially Isolated (01/21/2023)   Social Connection and Isolation Panel    Frequency of Communication with Friends and Family: More than three times a week    Frequency of Social Gatherings with Friends and Family: More than three times a week    Attends Religious Services: Never    Database administrator or Organizations: No    Attends Banker Meetings: Never    Marital Status: Divorced  Catering manager Violence: Not At Risk (01/21/2023)   Humiliation, Afraid, Rape, and Kick questionnaire    Fear of Current or Ex-Partner: No    Emotionally Abused: No    Physically Abused: No    Sexually Abused: No   Review of Systems No  swelling     Objective:   Physical Exam  Musculoskeletal:     Comments: Right knee without swelling. Normal ROM and no ligament/meniscus findings Ankle is quiet also Pain area is along lateral tibia border--but not overly tender now            Assessment & Plan:

## 2023-12-24 NOTE — Assessment & Plan Note (Signed)
 Right side Likely related to walking on treadmill ---discussed

## 2023-12-24 NOTE — Patient Instructions (Signed)
 Shin Splints  Shin splints is a painful condition that is felt either on the bone that is located in the front of the lower leg (tibia or shin bone) or in the muscles on either side of the bone. This condition happens when physical activity leads to inflammation of the muscles, tendons, and the thin layer of tissue that covers the shin bone. It may result from participating in sports or other intense exercises. What are the causes? This condition may be caused by: Muscle overuse. Repetitive activities. Flat feet or rigid arches. Activities that could contribute to shin splints include: Having a sudden increase in exercise time. Starting a new, intense activity. Running up hills or long distances. Playing sports that involve sudden starts and stops. Not warming up before activity. Wearing old or worn-out shoes. What are the signs or symptoms? The main symptom of this condition is pain that occurs: On the front of the lower leg. In the muscles on either side of the shin bone. While exercising or at rest. How is this diagnosed? This condition may be diagnosed based on: A physical exam. Your symptoms. Observation. Your health care provider may watch you while you walk or run. X-rays or other imaging tests. These may be done to rule out other problems. How is this treated? Treatment for this condition depends on your age, history, overall health, and how bad the pain is. Most cases can be managed by doing one or more of the following: Resting. Reducing the length and intensity of your exercise. Stopping or reducing the activity that causes shin pain. Taking medicines to control the inflammation. Icing, massaging, stretching, and strengthening the affected area. Wearing shoes that have rigid heels, shock absorption, and a good arch support. For severe shin pain, your health care provider may recommend that you use crutches to avoid putting weight on your legs. Use crutches as you are  told. Follow these instructions at home: Medicines Take over-the-counter and prescription medicines only as told by your health care provider. Ask your health care provider if the medicine prescribed to you: Requires you to avoid driving or using machinery. Can cause constipation. You may need to take these actions to prevent or treat constipation: Drink enough fluid to keep your urine pale yellow. Take over-the-counter or prescription medicines. Eat foods that are high in fiber, such as beans, whole grains, and fresh fruits and vegetables. Limit foods that are high in fat and processed sugars, such as fried or sweet foods. Managing pain, stiffness, and swelling     If directed, put ice on the painful area. To do this: Put ice in a plastic bag. Place a towel between your skin and the bag. Leave the ice on for 20 minutes, 2-3 times a day. If your skin turns bright red, remove the ice right away to prevent skin damage. The risk of skin damage is higher if you cannot feel pain, heat, or cold. If directed, apply heat to the painful area before you do the stretching exercises, or as told by your health care provider. Use the heat source that your health care provider recommends, such as a moist heat pack or a heating pad. Place a towel between your skin and the heat source. Leave the heat on for 20-30 minutes. If your skin turns bright red, remove the heat right away to prevent burns. The risk of burns is higher if you cannot feel pain, heat, or cold. Massage, stretch, and strengthen the affected area as told by your  health care provider. Wear compression sleeves or socks as told by your health care provider. Raise (elevate) your legs above the level of your heart while you are sitting or lying down. Activity Rest as needed. Return to activity gradually as told by your health care provider. When you start exercising again, begin with non-weight-bearing exercises, such as cycling or  swimming. Stop running if the pain returns. Warm up properly before exercising. Run on a surface that is level and fairly firm. Gradually change the intensity of an exercise. If you increase your running distance, add only 5-10% to your distance each week. This means that if you are running 5 miles this week, you should only increase your run by - mile for next week. General instructions Wear shoes that have rigid heels, shock absorption, and a good arch support. Change your athletic shoes every 6 months, or every 350-450 miles. Contact a health care provider if: Your symptoms continue or get worse after treatment. The location, intensity, or type of pain changes. You have swelling in your lower leg that gets worse. Your shin becomes red and feels warm. Get help right away if: You have severe pain. You have trouble walking. Summary Shin splints happens when physical activities lead to inflammation of the muscles, tendons, and the thin layer of tissue that covers the shin bone. Treatments may include medicines, resting, and icing. Return to activity gradually as told by your health care provider. Make sure you know what symptoms should cause you to contact your health care provider. This information is not intended to replace advice given to you by your health care provider. Make sure you discuss any questions you have with your health care provider. Document Revised: 09/21/2021 Document Reviewed: 09/21/2021 Elsevier Patient Education  2024 [employer].

## 2023-12-29 ENCOUNTER — Ambulatory Visit (HOSPITAL_COMMUNITY)
Admission: RE | Admit: 2023-12-29 | Discharge: 2023-12-29 | Disposition: A | Discharge status: Home / Self Care | Source: Ambulatory Visit | Attending: Physician Assistant | Admitting: Physician Assistant

## 2023-12-29 DIAGNOSIS — R1032 Left lower quadrant pain: Secondary | ICD-10-CM | POA: Diagnosis not present

## 2023-12-29 DIAGNOSIS — K5732 Diverticulitis of large intestine without perforation or abscess without bleeding: Secondary | ICD-10-CM | POA: Diagnosis not present

## 2023-12-29 DIAGNOSIS — D259 Leiomyoma of uterus, unspecified: Secondary | ICD-10-CM | POA: Diagnosis not present

## 2023-12-29 MED ORDER — IOHEXOL 300 MG/ML  SOLN
100.0000 mL | Freq: Once | INTRAMUSCULAR | Status: AC | PRN
  Administered 2023-12-29: 100 mL via INTRAVENOUS

## 2023-12-29 MED ORDER — IOHEXOL 9 MG/ML PO SOLN
ORAL | Status: AC
  Filled 2023-12-29: qty 1000

## 2023-12-29 MED ORDER — IOHEXOL 9 MG/ML PO SOLN
1000.0000 mL | Freq: Once | ORAL | Status: AC
  Administered 2023-12-29: 1000 mL via ORAL

## 2024-01-02 NOTE — Progress Notes (Signed)
 Hi Dr. Cleatus.  I ordered this abdominal pelvic CT which showed resolution of previous diverticulitis.  There was an incidental left lower lung nodule.  I recommend patient follow-up with you for the lung nodule. Thank you, Ellouise Console, PA-C  [facility]

## 2024-01-04 NOTE — Addendum Note (Signed)
 Addended by: CLEATUS ARLYSS RAMAN on: 01/04/2024 09:19 PM   Modules accepted: Orders

## 2024-01-06 NOTE — Progress Notes (Deleted)
 Ellouise Console, PA-C [address] [place], [place]  [address] Phone: [phone]   Primary Care Physician: Cleatus Arlyss RAMAN, MD  Primary Gastroenterologist:  Ellouise Console, PA-C / Elspeth Naval, MD   Chief Complaint: Follow-up LLQ pain, constipation, IBS      HPI:   [name] is a 73 y.o. female returns for 5-week follow-up of LLQ pain, diverticulitis, IBS-C.  I saw her 11/14/2023 for LLQ pain.  Started empiric treatment with Cipro  and Flagyl  for 10 days.  Also started MiraLAX for constipation.  Had another follow-up visit 11/28/2023.  She was having persistent LLQ pain.  I ordered repeat abdominal pelvic CT done 12/29/23 which showed no evidence of diverticulitis.  Current Symptoms:     Alcohol?  12/29/2023 abdominal pelvic CT with contrast: No acute abnormality.  Prior cholecystectomy.  Normal liver.  Diverticulosis but no diverticulitis.  Previous mild left colon diverticulitis had resolved.  Incidental small uterine fibroid and 13 mm left lower lobe pulmonary nodule.  She was advised to follow-up with PCP for lung nodule.  12/09/2023 Hemoccult cards negative x 3. 11/28/2023 labs: Normal CBC (WBC 4.0, Hgb 13.3).  Normal BMP.  Mildly elevated liver transaminases with AST 53, ALT 58.   Abd / Pelvic CT 01/2023: showed mild uncomplicated sigmoid diverticulitis with no perforation or abscess.  Resolved with antibiotics.    08/2020 colonoscopy by Dr. Naval: 2 small (3 mm, 4 mm) adenomatous polyps removed.  Medium lipoma in the transverse colon.  Diverticulosis and internal hemorrhoids.  Adequate prep.  5-year repeat (due 08/2025) pending health status.  Her father had colon cancer diagnosed age 47s.    04/2015 EGD by Dr. Naval: Normal.  Biopsies negative for H. Pylori and celiac.   04/2015 colonoscopy: 2 small hyperplastic rectal polyps removed.   07/2010 colonoscopy: Normal.  Current Outpatient Medications  Medication Sig Dispense Refill   cholecalciferol  (VITAMIN D3) 25 MCG (1000 UNIT) tablet Take 1,000 Units by mouth daily.     Coenzyme Q10 (CO Q 10 PO) Take by mouth.     dicyclomine  (BENTYL ) 20 MG tablet TAKE ONE TABLET BY MOUTH THREE TIMES DAILY BEFORE meals 90 tablet 1   estradiol  (ESTRACE ) 0.1 MG/GM vaginal cream APPLY A PEA SIZED AMOUNT TO AFFECTED AREA TWICE A WEEK AS NEEDED 42.5 g 0   loratadine  (CLARITIN ) 10 MG tablet Take 10 mg by mouth daily.     omega-3 acid ethyl esters (LOVAZA) 1 g capsule Take by mouth 2 (two) times daily.     psyllium (METAMUCIL) 58.6 % packet Take 1 packet by mouth daily.     rosuvastatin  (CRESTOR ) 20 MG tablet Take 1 tablet (20 mg total) by mouth daily. 90 tablet 1   verapamil  (CALAN -SR) 240 MG CR tablet TAKE 1 TABLET EVERY DAY 90 tablet 3   No current facility-administered medications for this visit.    Allergies as of 01/07/2024 - Reviewed 12/29/2023  Allergen Reaction Noted   Morphine sulfate Swelling 09/29/2006   Penicillins Swelling 09/29/2006    Past Medical History:  Diagnosis Date   Allergy    Anemia    age 35    Diverticulosis    GERD (gastroesophageal reflux disease)    occasional; OTC as needed   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   Hyperlipidemia    Hypertrophic cardiomyopathy (HCC)    Palpitations    Trigger finger of left hand 09/2012   left long finger  Past Surgical History:  Procedure Laterality Date   BREAST REDUCTION SURGERY Bilateral 2001   CHOLECYSTECTOMY  07/17/2011   Procedure: LAPAROSCOPIC CHOLECYSTECTOMY WITH INTRAOPERATIVE CHOLANGIOGRAM;  Surgeon: Sherlean JINNY Laughter, MD;  Location: [facility];  Service: General;  Laterality: N/A;   COLONOSCOPY     FOOT GANGLION EXCISION Right    HEMORRHOID SURGERY  1980   LACERATION REPAIR Right 1996   thumb   MYOMECTOMY  10/09/99   x 2   OOPHORECTOMY Right 1980   OOPHORECTOMY Left 2000   OVARIAN CYST DRAINAGE  1980   PARTIAL HYSTERECTOMY  2002   with no remaining ovary   POLYPECTOMY     TRIGGER FINGER RELEASE  Left 09/28/2012   Procedure: RELEASE TRIGGER FINGER/A-1 PULLEY;  Surgeon: Alm DELENA Hummer, MD;  Location: [facility];  Service: Orthopedics;  Laterality: Left;   UPPER GASTROINTESTINAL ENDOSCOPY      Review of Systems:    All systems reviewed and negative except where noted in HPI.    Physical Exam:  There were no vitals taken for this visit. No LMP recorded. Patient is postmenopausal.  General: Well-nourished, well-developed in no acute distress.  Lungs: Clear to auscultation bilaterally. Non-labored. Heart: Regular rate and rhythm, no murmurs rubs or gallops.  Abdomen: Bowel sounds are normal; Abdomen is Soft; No hepatosplenomegaly, masses or hernias;  No Abdominal Tenderness; No guarding or rebound tenderness. Neuro: Alert and oriented x 3.  Grossly intact.  Psych: Alert and cooperative, normal mood and affect.   Imaging Studies: CT ABDOMEN PELVIS W CONTRAST Result Date: 01/01/2024 CLINICAL DATA:  Left lower quadrant pain.  Diverticulitis. EXAM: CT ABDOMEN AND PELVIS WITH CONTRAST TECHNIQUE: Multidetector CT imaging of the abdomen and pelvis was performed using the standard protocol following bolus administration of intravenous contrast. RADIATION DOSE REDUCTION: This exam was performed according to the departmental dose-optimization program which includes automated exposure control, adjustment of the mA and/or kV according to patient size and/or use of iterative reconstruction technique. CONTRAST:  OMNIPAQUE  IOHEXOL  300 MG/ML  SOLN COMPARISON:  01/29/2023 FINDINGS: Lower Chest: 13 mm ill-defined ground-glass nodule is seen in the anterior left lower lobe on image 14/4. Low-grade adenocarcinoma cannot be excluded. Hepatobiliary: No suspicious hepatic masses identified. Prior cholecystectomy. No evidence of biliary obstruction. Pancreas:  No mass or inflammatory changes. Spleen: Within normal limits in size and appearance. Adrenals/Urinary Tract: No suspicious masses  identified. No evidence of ureteral calculi or hydronephrosis. Stomach/Bowel: Colonic diverticulosis again noted, however, previously seen mild diverticulitis involving the proximal descending colon has resolved since prior exam. No other inflammatory process or abnormal fluid collections. Vascular/Lymphatic: No pathologically enlarged lymph nodes. No acute vascular findings. Reproductive: 4 cm right-sided uterine fibroid with central calcification shows no significant change. Adnexal regions are unremarkable. Other:  None. Musculoskeletal:  No suspicious bone lesions identified. IMPRESSION: No acute findings in the abdomen or pelvis. Colonic diverticulosis, without radiographic signs of diverticulitis. Previously seen mild left colonic diverticulitis has resolved. Stable small uterine fibroid. 13 mm ground-glass pulmonary nodule in anterior left lower lobe. Low-grade adenocarcinoma cannot be excluded. Per Fleischner Society Guidelines, recommend a non-contrast Chest CT at 6 months to confirm persistence, then additional non-contrast Chest CTs every 2 years until 5 years. If nodule grows or develops solid component(s), consider resection. These guidelines do not apply to immunocompromised patients and patients with cancer. Follow up in patients with significant comorbidities as clinically warranted. For lung cancer screening, adhere to Lung-RADS guidelines. Reference: Radiology. 2017; 284(1):228-43. Electronically Signed  By: Norleen DELENA Kil M.D.   On: 01/01/2024 13:49    Labs: CBC    Component Value Date/Time   WBC 4.0 11/28/2023 1414   RBC 4.96 11/28/2023 1414   HGB 13.3 11/28/2023 1414   HCT 40.5 11/28/2023 1414   PLT 219.0 11/28/2023 1414   MCV 81.7 11/28/2023 1414   MCH 27.6 01/30/2023 0122   MCHC 32.8 11/28/2023 1414   RDW 13.9 11/28/2023 1414   LYMPHSABS 1.4 11/28/2023 1414   MONOABS 0.4 11/28/2023 1414   EOSABS 0.2 11/28/2023 1414   BASOSABS 0.0 11/28/2023 1414    CMP     Component  Value Date/Time   NA 141 11/28/2023 1414   K 3.8 11/28/2023 1414   CL 105 11/28/2023 1414   CO2 30 11/28/2023 1414   GLUCOSE 104 (H) 11/28/2023 1414   BUN 10 11/28/2023 1414   CREATININE 0.65 11/28/2023 1414   CREATININE 0.61 09/26/2011 1538   CALCIUM  10.1 11/28/2023 1414   PROT 8.0 11/28/2023 1414   ALBUMIN 4.5 11/28/2023 1414   AST 53 (H) 11/28/2023 1414   ALT 58 (H) 11/28/2023 1414   ALKPHOS 64 11/28/2023 1414   BILITOT 0.4 11/28/2023 1414   GFRNONAA >60 01/30/2023 0122   GFRNONAA >89 09/26/2011 1538   GFRAA >60 06/09/2018 1324   GFRAA >89 09/26/2011 1538       Assessment and Plan:   [name] is a 73 y.o. y/o female returns for follow-up of:  1.  Mild uncomplicated sigmoid diverticulitis resolved s/p antibiotic treatment with Cipro  and Flagyl .  Follow-up CT showed diverticulitis had resolved. - Reassurance, no further treatment needed  2.  Elevated liver transaminases; recent CT showed normal liver. - Repeat LFTs, viral hepatitis A, B, C labs, ANA, ASMA. - Avoid alcohol  3.  Pulmonary nodule - Follow-up with PCP  4.  Irritable bowel syndrome, constipation predominant - Treat constipation: MiraLAX, Linzess? - IBgard?  5.  History of colon polyps and family history of colon cancer (father) - 5-year repeat Colonoscopy (due 08/2025), pending health status.  Ellouise Console, PA-C  Follow up ***

## 2024-01-07 ENCOUNTER — Ambulatory Visit: Admitting: Physician Assistant

## 2024-01-12 ENCOUNTER — Telehealth: Payer: Self-pay | Admitting: Physician Assistant

## 2024-01-12 NOTE — Telephone Encounter (Signed)
 Patient called and stated that she cancel her appointment on for July the 2 nd with tina garrett through my chart and is not sure why she is getting a no show notification. Patient is requesting a call back. Please advise.

## 2024-01-12 NOTE — Telephone Encounter (Signed)
 Patient states she sent a message on 01/06/24 that she was not able to make her appointment on 7/2.  She was not able to get through on the telephone.  I reassured the patient that she will not be a charged a no-show fee.  She thanked me for the call.   Dyane, are you able to cancel any no show fees if there are any?

## 2024-01-13 ENCOUNTER — Encounter: Payer: Self-pay | Admitting: Pulmonary Disease

## 2024-01-13 ENCOUNTER — Ambulatory Visit: Admitting: Pulmonary Disease

## 2024-01-13 VITALS — BP 160/92 | HR 81 | Temp 98.3°F | Ht 65.0 in | Wt 182.0 lb

## 2024-01-13 DIAGNOSIS — Z87891 Personal history of nicotine dependence: Secondary | ICD-10-CM

## 2024-01-13 DIAGNOSIS — R911 Solitary pulmonary nodule: Secondary | ICD-10-CM | POA: Diagnosis not present

## 2024-01-13 DIAGNOSIS — K589 Irritable bowel syndrome without diarrhea: Secondary | ICD-10-CM

## 2024-01-13 NOTE — Progress Notes (Signed)
 Subjective:    Patient ID: [name], female    DOB: 12/22/1950, 73 y.o.   MRN: [id number]  Patient Care Team: Cleatus Arlyss RAMAN, MD as PCP - General (Family Medicine) Lavona Agent, MD as PCP - Cardiology (Cardiology) Obie Princella HERO, MD (Inactive) as Consulting Physician (Gastroenterology) Noralyn Standing, MD as Consulting Physician (Gastroenterology)  Chief Complaint  Patient presents with   Consult    Occasional cough and wheezing.     BACKGROUND: Patient is a 73 year old remote former smoker who presents for evaluation of a lung nodule incidentally noted on CT of the abdomen and pelvis.  She is kindly referred by Dr. Arlyss Cleatus.  HPI Discussed the use of AI scribe software for clinical note transcription with the patient, who gave verbal consent to proceed.  History of Present Illness   [name] is a 73 year old female with a history of lung nodules who presents for reestablishing pulmonary care. Previously followed by Doctor Verdon Gore at the our [facility] office.  She has a history of lung nodules initially identified a few years ago during screening due to her status as an ex-smoker. At that time, the nodules were either three or nine millimeters in size. A recent CT scan with contrast showed a nodule in her left lung measuring thirteen millimeters. The last scan she had was three to five years ago, with the most recent prior scan being from 2021, showing the nodule size between twelve to fourteen millimeters.  The nodule is groundglass without a solid component.  There is no recent dedicated chest CT.  No breathing problems, but she describes a burning sensation 'like somebody's sticking a cigarette to me' on the inside, pointing out from under her rib area to her back. No shortness of breath.  Very rarely does she experience a cough, usually nonproductive.  Also very rarely she may have some wheezing but she cannot recollect when was the last  time that this occurred.  Smoking history includes quitting in 2008. Long-standing diagnosis of irritable bowel syndrome (IBS) with stomach issues for about twenty years, since 2006. Her stomach was hurting this morning, but the pain subsided after taking medication for IBS.  No swelling in her legs but she mentions experiencing shin splints, which are painful to touch.   She does not endorse any other symptomatology.  I have reviewed Dr. Sueellen Desai's notes.  It appears that PFTs were discussed with the patient previously to evaluate for potential COPD however the patient declined.   Review of Systems A 10 point review of systems was performed and it is as noted above otherwise negative.   Past Medical History:  Diagnosis Date   Allergy    Anemia    age 66    Diverticulosis    GERD (gastroesophageal reflux disease)    occasional; OTC as needed   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   Hyperlipidemia    Hypertrophic cardiomyopathy (HCC)    Palpitations    Trigger finger of left hand 09/2012   left long finger    Past Surgical History:  Procedure Laterality Date   BREAST REDUCTION SURGERY Bilateral 2001   CHOLECYSTECTOMY  07/17/2011   Procedure: LAPAROSCOPIC CHOLECYSTECTOMY WITH INTRAOPERATIVE CHOLANGIOGRAM;  Surgeon: Sherlean JINNY Laughter, MD;  Location: [facility] OR;  Service: General;  Laterality: N/A;   COLONOSCOPY     FOOT GANGLION EXCISION Right    HEMORRHOID SURGERY  1980   LACERATION  REPAIR Right 1996   thumb   MYOMECTOMY  10/09/99   x 2   OOPHORECTOMY Right 1980   OOPHORECTOMY Left 2000   OVARIAN CYST DRAINAGE  1980   PARTIAL HYSTERECTOMY  2002   with no remaining ovary   POLYPECTOMY     TRIGGER FINGER RELEASE Left 09/28/2012   Procedure: RELEASE TRIGGER FINGER/A-1 PULLEY;  Surgeon: Alm DELENA Hummer, MD;  Location: [facility];  Service: Orthopedics;  Laterality: Left;   UPPER GASTROINTESTINAL ENDOSCOPY      Patient Active Problem List    Diagnosis Date Noted   Anterior shin splints 12/24/2023   Skin lesion 09/28/2023   Rectal bleeding 01/28/2023   GERD (gastroesophageal reflux disease) 02/27/2022   Vaginal atrophy 11/07/2021   Occipital neuralgia 02/18/2021   Papule 12/31/2020   Plantar fasciitis 10/11/2020   Abdominal bloating 05/07/2020   Rhinorrhea 05/07/2020   TIA (transient ischemic attack) 09/29/2019   Seasonal allergies 04/14/2019   Healthcare maintenance 11/04/2018   Neck pain 11/27/2017   Aortic atherosclerosis (HCC) 09/30/2017   HLD (hyperlipidemia) 09/30/2017   Advance care planning 06/07/2015   Muscle strain 12/09/2014   ETD (eustachian tube dysfunction) 05/04/2014   Paresthesia 03/16/2014   Achilles tendonitis 09/15/2013   Balance problem 03/19/2013   Rash 07/10/2012   History of hepatitis C 05/29/2011   Mitral and aortic regurgitation 01/24/2011   Medicare annual wellness visit, subsequent 01/12/2011   LVH (left ventricular hypertrophy) 05/03/2009   DIVERTICULITIS OF COLON 03/02/2009   External hemorrhoids 10/24/2008    Family History  Problem Relation Age of Onset   Colon cancer Father 8   Diabetes Sister    Hepatitis C Brother    Hypertension Brother    Hepatitis C Brother    Colon cancer Paternal Aunt        dx'd in her 50's    Breast cancer Paternal Aunt        two aunts   Lung cancer Paternal Uncle    Breast cancer Cousin    Liver cancer Cousin    Colon cancer Other        cousin    Colon polyps Neg Hx    Esophageal cancer Neg Hx    Rectal cancer Neg Hx    Stomach cancer Neg Hx     Social History   Tobacco Use   Smoking status: Former    Current packs/day: 0.00    Average packs/day: 2.5 packs/day for 42.0 years (105.0 ttl pk-yrs)    Types: Cigarettes    Start date: 07/08/1963    Quit date: 07/07/2005    Years since quitting: 18.5   Smokeless tobacco: Never   Tobacco comments:    quit smoking 2008  Substance Use Topics   Alcohol use: Not Currently    Comment:  rarely    Allergies  Allergen Reactions   Morphine Sulfate Swelling   Penicillins Swelling    Current Meds  Medication Sig   cholecalciferol (VITAMIN D3) 25 MCG (1000 UNIT) tablet Take 1,000 Units by mouth daily.   Coenzyme Q10 (CO Q 10 PO) Take by mouth.   dicyclomine  (BENTYL ) 20 MG tablet TAKE ONE TABLET BY MOUTH THREE TIMES DAILY BEFORE meals   estradiol  (ESTRACE ) 0.1 MG/GM vaginal cream APPLY A PEA SIZED AMOUNT TO AFFECTED AREA TWICE A WEEK AS NEEDED   loratadine  (CLARITIN ) 10 MG tablet Take 10 mg by mouth daily.   omega-3 acid ethyl esters (LOVAZA) 1 g capsule Take by mouth 2 (two) times  daily.   psyllium (METAMUCIL) 58.6 % packet Take 1 packet by mouth daily.   rosuvastatin  (CRESTOR ) 20 MG tablet Take 1 tablet (20 mg total) by mouth daily.   verapamil  (CALAN -SR) 240 MG CR tablet TAKE 1 TABLET EVERY DAY    Immunization History  Administered Date(s) Administered   Fluad Quad(high Dose 65+) 05/05/2020, 05/28/2021   Hep A / Hep B 06/20/2011, 07/25/2011   Hepatitis B 12/27/2011   Influenza Split 06/10/2012   Influenza Whole 06/07/2006, 04/21/2008   Influenza, High Dose Seasonal PF 05/14/2022   Influenza,inj,Quad PF,6+ Mos 03/18/2013, 03/15/2014, 05/09/2015, 07/31/2017, 05/13/2018, 04/01/2019   Moderna Covid-19 Fall Seasonal Vaccine 68yrs & older 05/14/2022   Moderna Sars-Covid-2 Vaccination 07/30/2019, 08/25/2019, 05/02/2020, 10/31/2020   Pneumococcal Conjugate-13 06/06/2015   Pneumococcal Polysaccharide-23 04/27/2013   Td 06/11/2005   Tdap 09/06/2014   Zoster, Live 04/27/2013        Objective:     BP (!) 160/92 (BP Location: Left Arm, Patient Position: Sitting, Cuff Size: Normal)   Pulse 81   Temp 98.3 F (36.8 C) (Oral)   Ht 5' 5 (1.651 m)   Wt 182 lb (82.6 kg)   SpO2 97%   BMI 30.29 kg/m   SpO2: 97 %  GENERAL: Well-developed somewhat overweight woman, no acute distress, fully ambulatory, no conversational dyspnea. HEAD: Normocephalic, atraumatic.  EYES:  Pupils equal, round, reactive to light.  No scleral icterus.  MOUTH: Dentition intact, oral mucosa moist.  No thrush. NECK: Supple. No thyromegaly. Trachea midline. No JVD.  No adenopathy. PULMONARY: Good air entry bilaterally.  No adventitious sounds. CARDIOVASCULAR: S1 and S2. Regular rate and rhythm.  No rubs, murmurs or gallops heard.   ABDOMEN: Benign. MUSCULOSKELETAL: No joint deformity, no clubbing, no edema.  NEUROLOGIC: No overt focal deficit, no gait disturbance, speech is fluent. SKIN: Intact,warm,dry. PSYCH: Mood and behavior normal.  Representative image from CT performed 29 December 2023 showing the groundglass nodule in question (arrow):    Assessment & Plan:     ICD-10-CM   1. Lung nodule seen on imaging study - [facility]  R91.1 CT CHEST WO CONTRAST    2. Irritable bowel syndrome, unspecified type  K58.9       Orders Placed This Encounter  Procedures   CT CHEST WO CONTRAST    Standing Status:   Future    Expiration Date:   01/12/2025    Preferred imaging location?:   [facility]   Discussion:    Pulmonary nodule The pulmonary nodule in the left lung is ground glass without a solid component, measuring approximately 13 to 14 millimeters. It has not shown significant growth since 2021. - Order dedicated chest CT without contrast to evaluate the pulmonary nodule and ensure no other abnormalities are present. - Schedule follow-up to discuss CT results and determine further management.  Irritable Bowel Syndrome (IBS) She experiences abdominal pain responsive to medication, with ongoing symptoms since 2006. Recent CT scan with contrast was performed due to these symptoms. There is no indication of acute exacerbation at this time.     Advised if symptoms do not improve or worsen, to please contact office for sooner follow up or seek emergency care.    I spent 45 minutes of dedicated to the care of this patient on the date of this encounter to include pre-visit review  of records, face-to-face time with the patient discussing conditions above, post visit ordering of testing, clinical documentation with the electronic health record, making appropriate referrals as documented, and  communicating necessary findings to members of the patients care team.   C. Leita Sanders, MD Advanced Bronchoscopy PCCM [facility]    *This note was dictated using voice recognition software/Dragon.  Despite best efforts to proofread, errors can occur which can change the meaning. Any transcriptional errors that result from this process are unintentional and may not be fully corrected at the time of dictation.

## 2024-01-13 NOTE — Patient Instructions (Signed)
 VISIT SUMMARY:  Today, you came in to reestablish pulmonary care due to your history of lung nodules. We discussed your recent CT scan results and your ongoing symptoms related to irritable bowel syndrome (IBS).  YOUR PLAN:  -PULMONARY NODULE: A pulmonary nodule is a small growth in the lung. Your recent CT scan showed a nodule in your left lung that has not significantly changed in size since 2021. We will order a dedicated chest CT without contrast to further evaluate the nodule and ensure there are no other abnormalities. We will schedule a follow-up appointment to discuss the CT results and determine the next steps.  -IRRITABLE BOWEL SYNDROME (IBS): Irritable Bowel Syndrome (IBS) is a chronic condition that affects the digestive system, causing symptoms like abdominal pain. Your abdominal pain is responsive to medication, and there is no indication of an acute flare-up at this time.  INSTRUCTIONS:  Please schedule a chest CT without contrast as soon as possible. We will follow up to discuss the results and plan further management. Continue taking your IBS medication as prescribed and monitor your symptoms.

## 2024-01-16 ENCOUNTER — Encounter: Payer: Self-pay | Admitting: Pulmonary Disease

## 2024-01-22 ENCOUNTER — Ambulatory Visit: Payer: Medicare HMO

## 2024-01-22 VITALS — Ht 65.0 in | Wt 182.0 lb

## 2024-01-22 DIAGNOSIS — Z0000 Encounter for general adult medical examination without abnormal findings: Secondary | ICD-10-CM

## 2024-01-22 NOTE — Progress Notes (Signed)
 Subjective:   [name] is a 73 y.o. who presents for a Medicare Wellness preventive visit.  As a reminder, Annual Wellness Visits don't include a physical exam, and some assessments may be limited, especially if this visit is performed virtually. We may recommend an in-person follow-up visit with your provider if needed.  Visit Complete: Virtual I connected with  [name] on 01/22/24 by a audio enabled telemedicine application and verified that I am speaking with the correct person using two identifiers.  Patient Location: Home  Provider Location: Home Office  I discussed the limitations of evaluation and management by telemedicine. The patient expressed understanding and agreed to proceed.  Vital Signs: Because this visit was a virtual/telehealth visit, some criteria may be missing or patient reported. Any vitals not documented were not able to be obtained and vitals that have been documented are patient reported.  VideoDeclined- This patient declined Librarian, academic. Therefore the visit was completed with audio only.  Persons Participating in Visit: Patient.  AWV Questionnaire: No: Patient Medicare AWV questionnaire was not completed prior to this visit.  Cardiac Risk Factors include: advanced age (>58men, >54 women);dyslipidemia;obesity (BMI >30kg/m2);sedentary lifestyle     Objective:    Today's Vitals   01/22/24 1011 01/22/24 1013  Weight: 182 lb (82.6 kg)   Height: 5' 5 (1.651 m)   PainSc:  5    Body mass index is 30.29 kg/m.     01/22/2024   10:30 AM 01/30/2023   12:16 AM 01/29/2023    9:20 AM 01/21/2023    9:20 AM 11/22/2022    7:44 AM 01/31/2022    4:14 PM 02/04/2021    9:24 AM  Advanced Directives  Does Patient Have a Medical Advance Directive? No No No No No No No  Would patient like information on creating a medical advance directive?  No - Patient declined No - Patient declined No - Patient declined   No - Patient  declined    Current Medications (verified) Outpatient Encounter Medications as of 01/22/2024  Medication Sig   cholecalciferol (VITAMIN D3) 25 MCG (1000 UNIT) tablet Take 1,000 Units by mouth daily.   Coenzyme Q10 (CO Q 10 PO) Take by mouth.   dicyclomine  (BENTYL ) 20 MG tablet TAKE ONE TABLET BY MOUTH THREE TIMES DAILY BEFORE meals   loratadine  (CLARITIN ) 10 MG tablet Take 10 mg by mouth daily.   omega-3 acid ethyl esters (LOVAZA) 1 g capsule Take by mouth 2 (two) times daily.   psyllium (METAMUCIL) 58.6 % packet Take 1 packet by mouth daily.   rosuvastatin  (CRESTOR ) 20 MG tablet Take 1 tablet (20 mg total) by mouth daily.   verapamil  (CALAN -SR) 240 MG CR tablet TAKE 1 TABLET EVERY DAY   estradiol  (ESTRACE ) 0.1 MG/GM vaginal cream APPLY A PEA SIZED AMOUNT TO AFFECTED AREA TWICE A WEEK AS NEEDED (Patient not taking: Reported on 01/22/2024)   No facility-administered encounter medications on file as of 01/22/2024.    Allergies (verified) Morphine sulfate and Penicillins   History: Past Medical History:  Diagnosis Date   Allergy    Anemia    age 42    Diverticulosis    GERD (gastroesophageal reflux disease)    occasional; OTC as needed   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   Hyperlipidemia    Hypertrophic cardiomyopathy (HCC)    Palpitations    Trigger finger of left hand 09/2012   left long finger  Past Surgical History:  Procedure Laterality Date   BREAST REDUCTION SURGERY Bilateral 2001   BREAST SURGERY  2001   Reduction   CHOLECYSTECTOMY  07/17/2011   Procedure: LAPAROSCOPIC CHOLECYSTECTOMY WITH INTRAOPERATIVE CHOLANGIOGRAM;  Surgeon: Sherlean JINNY Laughter, MD;  Location: [facility] OR;  Service: General;  Laterality: N/A;   COLONOSCOPY     FOOT GANGLION EXCISION Right    HEMORRHOID SURGERY  1980   LACERATION REPAIR Right 1996   thumb   MYOMECTOMY  10/09/1999   x 2   OOPHORECTOMY Right 1980   OOPHORECTOMY Left 2000   OVARIAN CYST DRAINAGE  1980   PARTIAL  HYSTERECTOMY  2002   with no remaining ovary   POLYPECTOMY     TRIGGER FINGER RELEASE Left 09/28/2012   Procedure: RELEASE TRIGGER FINGER/A-1 PULLEY;  Surgeon: Alm DELENA Hummer, MD;  Location: [facility];  Service: Orthopedics;  Laterality: Left;   UPPER GASTROINTESTINAL ENDOSCOPY     Family History  Problem Relation Age of Onset   Colon cancer Father 40   Early death Father    Diabetes Sister    Hepatitis C Brother    Cancer Brother    Hypertension Brother    Hepatitis C Brother    Colon cancer Paternal Aunt        dx'd in her 36's    Breast cancer Paternal Aunt        two aunts   Lung cancer Paternal Uncle    Breast cancer Cousin    Liver cancer Cousin    Colon cancer Other        cousin    Cancer Maternal Uncle    Cancer Maternal Aunt    Colon polyps Neg Hx    Esophageal cancer Neg Hx    Rectal cancer Neg Hx    Stomach cancer Neg Hx    Social History   Socioeconomic History   Marital status: Divorced    Spouse name: Not on file   Number of children: 2   Years of education: Not on file   Highest education level: Not on file  Occupational History   Occupation: [employer] from home  Tobacco Use   Smoking status: Former    Current packs/day: 0.00    Average packs/day: 2.5 packs/day for 42.0 years (105.0 ttl pk-yrs)    Types: Cigarettes    Start date: 07/08/1963    Quit date: 07/07/2005    Years since quitting: 18.5   Smokeless tobacco: Never   Tobacco comments:    quit smoking 2008  Vaping Use   Vaping status: Never Used  Substance and Sexual Activity   Alcohol use: Not Currently    Comment: rarely   Drug use: Yes    Frequency: 7.0 times per week    Types: Marijuana    Comment: previous hx. of   Sexual activity: Yes    Birth control/protection: Surgical  Other Topics Concern   Not on file  Social History Narrative   Divorced 1982   2 children   Left handed      Retired 2024   Do you live at home alone?yes   Who lives with you?     What type of home do you live in: 1 story or 2 story? two   Caffeine no    Social Drivers of Corporate investment banker Strain: Low Risk  (01/22/2024)   Overall Financial Resource Strain (CARDIA)    Difficulty of Paying Living Expenses: Not hard at all  Food Insecurity: No Food Insecurity (01/22/2024)   Hunger Vital Sign    Worried About Running Out of Food in the Last Year: Never true    Ran Out of Food in the Last Year: Never true  Transportation Needs: No Transportation Needs (01/22/2024)   PRAPARE - Administrator, Civil Service (Medical): No    Lack of Transportation (Non-Medical): No  Physical Activity: Inactive (01/22/2024)   Exercise Vital Sign    Days of Exercise per Week: 0 days    Minutes of Exercise per Session: 0 min  Stress: No Stress Concern Present (01/22/2024)   [employer] of Occupational Health - Occupational Stress Questionnaire    Feeling of Stress: Not at all  Social Connections: Socially Isolated (01/22/2024)   Social Connection and Isolation Panel    Frequency of Communication with Friends and Family: More than three times a week    Frequency of Social Gatherings with Friends and Family: More than three times a week    Attends Religious Services: Never    Database administrator or Organizations: No    Attends Engineer, structural: Never    Marital Status: Divorced    Tobacco Counseling Counseling given: Not Answered Tobacco comments: quit smoking 2008    Clinical Intake:  Pre-visit preparation completed: Yes  Pain : 0-10 Pain Score: 5  Pain Type: Chronic pain Pain Location: Shoulder Pain Orientation: Right Pain Descriptors / Indicators: Aching Pain Onset: More than a month ago Pain Frequency: Intermittent Pain Relieving Factors: BlueEmoo cream  Pain Relieving Factors: BlueEmoo cream  BMI - recorded: 30.29 Nutritional Status: BMI > 30  Obese Nutritional Risks: None Diabetes: No  No results found for: HGBA1C    How often do you need to have someone help you when you read instructions, pamphlets, or other written materials from your doctor or pharmacy?: 1 - Never  Interpreter Needed?: No  Comments: lives with son and grandson Information entered by :: B.Charise Leinbach,LPN   Activities of Daily Living     01/22/2024   10:30 AM  In your present state of health, do you have any difficulty performing the following activities:  Hearing? 0  Vision? 0  Difficulty concentrating or making decisions? 0  Walking or climbing stairs? 0  Dressing or bathing? 0  Doing errands, shopping? 0  Preparing Food and eating ? N  Using the Toilet? N  In the past six months, have you accidently leaked urine? N  Do you have problems with loss of bowel control? N  Managing your Medications? N  Managing your Finances? N  Housekeeping or managing your Housekeeping? N    Patient Care Team: Cleatus Arlyss RAMAN, MD as PCP - General (Family Medicine) Lavona Agent, MD as PCP - Cardiology (Cardiology) Obie Princella HERO, MD (Inactive) as Consulting Physician (Gastroenterology) Noralyn Standing, MD as Consulting Physician (Gastroenterology) Pa, [facility]  I have updated your Care Teams any recent Medical Services you may have received from other providers in the past year.     Assessment:   This is a routine wellness examination for Eugena.  Hearing/Vision screen Hearing Screening - Comments:: Pt says her hearing is good Vision Screening - Comments:: Pt says her vision is good   Goals Addressed             This Visit's Progress    COMPLETED: Patient Stated       02/15/2020, I will continue to ride my treadmill everyday for 30 minutes.  Patient Stated       I am taking a engine repair course       Depression Screen     01/22/2024   10:26 AM 12/24/2023    9:45 AM 01/21/2023    9:20 AM 09/19/2022    2:10 PM 01/31/2022    4:14 PM 07/11/2020   11:23 AM 02/15/2020    8:59 AM  PHQ 2/9 Scores  PHQ  - 2 Score 0 0 0 0 0 0 0  PHQ- 9 Score    1   0    Fall Risk     01/22/2024   10:21 AM 12/24/2023    9:44 AM 01/21/2023    9:22 AM 11/22/2022    7:44 AM 09/19/2022    2:10 PM  Fall Risk   Falls in the past year? 0 0 0 0 0  Number falls in past yr: 0 0 0 0 0  Injury with Fall? 0 0 0 0 0  Risk for fall due to : No Fall Risks No Fall Risks No Fall Risks  No Fall Risks  Follow up Education provided;Falls prevention discussed Falls evaluation completed Falls prevention discussed;Falls evaluation completed Falls evaluation completed Falls evaluation completed    MEDICARE RISK AT HOME:  Medicare Risk at Home Any stairs in or around the home?: Yes If so, are there any without handrails?: Yes Home free of loose throw rugs in walkways, pet beds, electrical cords, etc?: Yes Adequate lighting in your home to reduce risk of falls?: Yes Life alert?: No Use of a cane, walker or w/c?: No Grab bars in the bathroom?: Yes Shower chair or bench in shower?: No Elevated toilet seat or a handicapped toilet?: Yes  TIMED UP AND GO:  Was the test performed?  No  Cognitive Function: 6CIT completed    02/15/2020    9:01 AM  MMSE - Mini Mental State Exam  Orientation to time 5  Orientation to Place 5  Registration 3  Attention/ Calculation 5  Recall 3  Language- repeat 1        01/22/2024   10:40 AM 01/21/2023    9:25 AM 01/31/2022    4:16 PM  6CIT Screen  What Year? 0 points 0 points 0 points  What month? 0 points 0 points 0 points  What time? 0 points 0 points 0 points  Count back from 20 0 points 0 points 0 points  Months in reverse 0 points 0 points 0 points  Repeat phrase 0 points 0 points 0 points  Total Score 0 points 0 points 0 points    Immunizations Immunization History  Administered Date(s) Administered   Fluad Quad(high Dose 65+) 05/05/2020, 05/28/2021   Hep A / Hep B 06/20/2011, 07/25/2011   Hepatitis B 12/27/2011   Influenza Split 06/10/2012   Influenza Whole 06/07/2006,  04/21/2008   Influenza, High Dose Seasonal PF 05/14/2022   Influenza,inj,Quad PF,6+ Mos 03/18/2013, 03/15/2014, 05/09/2015, 07/31/2017, 05/13/2018, 04/01/2019   Moderna Covid-19 Fall Seasonal Vaccine 74yrs & older 05/14/2022   Moderna Sars-Covid-2 Vaccination 07/30/2019, 08/25/2019, 05/02/2020, 10/31/2020   Pneumococcal Conjugate-13 06/06/2015   Pneumococcal Polysaccharide-23 04/27/2013   Td 06/11/2005   Tdap 09/06/2014   Zoster, Live 04/27/2013    Screening Tests Health Maintenance  Topic Date Due   Zoster Vaccines- Shingrix (1 of 2) 03/16/1999   Pneumococcal Vaccine: 50+ Years (3 of 3 - PCV20 or PCV21) 06/05/2020   COVID-19 Vaccine (7 - 2024-25 season) 03/09/2023   DEXA SCAN  02/15/2024 (Originally  03/15/2014)   INFLUENZA VACCINE  02/06/2024   DTaP/Tdap/Td (3 - Td or Tdap) 09/05/2024   Medicare Annual Wellness (AWV)  01/21/2025   MAMMOGRAM  06/29/2025   Colonoscopy  08/24/2025   Hepatitis B Vaccines  Completed   Hepatitis C Screening  Completed   HPV VACCINES  Aged Out   Meningococcal B Vaccine  Aged Out    Health Maintenance  Health Maintenance Due  Topic Date Due   Zoster Vaccines- Shingrix (1 of 2) 03/16/1999   Pneumococcal Vaccine: 50+ Years (3 of 3 - PCV20 or PCV21) 06/05/2020   COVID-19 Vaccine (7 - 2024-25 season) 03/09/2023   Health Maintenance Items Addressed: None at this time  Additional Screening:  Vision Screening: Recommended annual ophthalmology exams for early detection of glaucoma and other disorders of the eye. Would you like a referral to an eye doctor? No    Dental Screening: Recommended annual dental exams for proper oral hygiene  Community Resource Referral / Chronic Care Management: CRR required this visit?  No   CCM required this visit?  No going out of town will schedule later   Plan:    I have personally reviewed and noted the following in the patient's chart:   Medical and social history Use of alcohol, tobacco or illicit drugs   Current medications and supplements including opioid prescriptions. Patient is not currently taking opioid prescriptions. Functional ability and status Nutritional status Physical activity Advanced directives List of other physicians Hospitalizations, surgeries, and ER visits in previous 12 months Vitals Screenings to include cognitive, depression, and falls Referrals and appointments  In addition, I have reviewed and discussed with patient certain preventive protocols, quality metrics, and best practice recommendations. A written personalized care plan for preventive services as well as general preventive health recommendations were provided to patient.   Erminio LITTIE Saris, LPN   [id number]   After Visit Summary: (MyChart) Due to this being a telephonic visit, the after visit summary with patients personalized plan was offered to patient via MyChart   Notes: Nothing significant to report at this time.

## 2024-01-22 NOTE — Patient Instructions (Addendum)
 Ms. [name] , Thank you for taking time out of your busy schedule to complete your Annual Wellness Visit with me. I enjoyed our conversation and look forward to speaking with you again next year. I, as well as your care team,  appreciate your ongoing commitment to your health goals. Please review the following plan we discussed and let me know if I can assist you in the future. Your Game plan/ To Do List     Follow up Visits: Next Medicare AWV with our clinical staff: 01/24/25 @ 10:10am televisit   Have you seen your provider in the last 6 months (3 months if uncontrolled diabetes)? Yes Next Office Visit with your provider: to be scheduled by pt  Clinician Recommendations:  Aim for 30 minutes of exercise or brisk walking, 6-8 glasses of water, and 5 servings of fruits and vegetables each day.       This is a list of the screening recommended for you and due dates:  Health Maintenance  Topic Date Due   Zoster (Shingles) Vaccine (1 of 2) 03/16/1999   Pneumococcal Vaccine for age over 39 (3 of 3 - PCV20 or PCV21) 06/05/2020   COVID-19 Vaccine (7 - 2024-25 season) 03/09/2023   DEXA scan (bone density measurement)  02/15/2024*   Flu Shot  02/06/2024   DTaP/Tdap/Td vaccine (3 - Td or Tdap) 09/05/2024   Medicare Annual Wellness Visit  01/21/2025   Mammogram  06/29/2025   Colon Cancer Screening  08/24/2025   Hepatitis B Vaccine  Completed   Hepatitis C Screening  Completed   HPV Vaccine  Aged Out   Meningitis B Vaccine  Aged Out  *Topic was postponed. The date shown is not the original due date.    Advanced directives: (Declined) Advance directive discussed with you today. Even though you declined this today, please call our office should you change your mind, and we can give you the proper paperwork for you to fill out. Advance Care Planning is important because it:  [x]  Makes sure you receive the medical care that is consistent with your values, goals, and preferences  [x]  It provides  guidance to your family and loved ones and reduces their decisional burden about whether or not they are making the right decisions based on your wishes.  Follow the link provided in your after visit summary or read over the paperwork we have mailed to you to help you started getting your Advance Directives in place. If you need assistance in completing these, please reach out to us  so that we can help you!

## 2024-01-23 ENCOUNTER — Ambulatory Visit (HOSPITAL_COMMUNITY)
Admission: RE | Admit: 2024-01-23 | Discharge: 2024-01-23 | Disposition: A | Discharge status: Home / Self Care | Source: Ambulatory Visit | Attending: Family Medicine | Admitting: Family Medicine

## 2024-01-23 DIAGNOSIS — R911 Solitary pulmonary nodule: Secondary | ICD-10-CM | POA: Insufficient documentation

## 2024-01-23 DIAGNOSIS — J984 Other disorders of lung: Secondary | ICD-10-CM | POA: Diagnosis not present

## 2024-01-27 ENCOUNTER — Ambulatory Visit: Payer: Self-pay | Admitting: Pulmonary Disease

## 2024-01-27 DIAGNOSIS — R911 Solitary pulmonary nodule: Secondary | ICD-10-CM

## 2024-01-27 NOTE — Telephone Encounter (Signed)
-----   Message from Dedra Sanders sent at 01/27/2024 10:49 AM EDT ----- The dedicated CT chest shows that the nodule that was in question has more of a solid component was previously shown on the abdominal scan.  I recommend that we get a PET/CT in the next few weeks and  move up her appointment to see me after the PET to discuss next steps. ----- Message ----- From: Interface, Rad Results In Sent: 01/26/2024   9:29 PM EDT To: Dedra LITTIE Sanders, MD

## 2024-01-27 NOTE — Telephone Encounter (Signed)
PET has been ordered. Nothing further needed.

## 2024-02-02 ENCOUNTER — Other Ambulatory Visit: Payer: Self-pay | Admitting: Cardiology

## 2024-02-02 DIAGNOSIS — E785 Hyperlipidemia, unspecified: Secondary | ICD-10-CM

## 2024-02-03 ENCOUNTER — Ambulatory Visit
Admission: RE | Admit: 2024-02-03 | Discharge: 2024-02-03 | Disposition: A | Discharge status: Home / Self Care | Source: Ambulatory Visit | Attending: Pulmonary Disease | Admitting: Pulmonary Disease

## 2024-02-03 DIAGNOSIS — Z9049 Acquired absence of other specified parts of digestive tract: Secondary | ICD-10-CM | POA: Insufficient documentation

## 2024-02-03 DIAGNOSIS — R918 Other nonspecific abnormal finding of lung field: Secondary | ICD-10-CM | POA: Diagnosis not present

## 2024-02-03 DIAGNOSIS — R911 Solitary pulmonary nodule: Secondary | ICD-10-CM | POA: Insufficient documentation

## 2024-02-03 DIAGNOSIS — I2510 Atherosclerotic heart disease of native coronary artery without angina pectoris: Secondary | ICD-10-CM | POA: Diagnosis not present

## 2024-02-03 DIAGNOSIS — I700 Atherosclerosis of aorta: Secondary | ICD-10-CM | POA: Diagnosis not present

## 2024-02-03 LAB — GLUCOSE, CAPILLARY: Glucose-Capillary: 108 mg/dL — ABNORMAL HIGH (ref 70–99)

## 2024-02-03 MED ORDER — FLUDEOXYGLUCOSE F - 18 (FDG) INJECTION
9.4000 | Freq: Once | INTRAVENOUS | Status: AC | PRN
  Administered 2024-02-03: 9.83 via INTRAVENOUS

## 2024-02-04 ENCOUNTER — Ambulatory Visit: Payer: Self-pay | Admitting: Pulmonary Disease

## 2024-02-05 NOTE — Telephone Encounter (Signed)
 Appt cancelled

## 2024-02-09 ENCOUNTER — Ambulatory Visit: Admitting: Pulmonary Disease

## 2024-02-11 DIAGNOSIS — M9903 Segmental and somatic dysfunction of lumbar region: Secondary | ICD-10-CM | POA: Diagnosis not present

## 2024-02-11 DIAGNOSIS — M9905 Segmental and somatic dysfunction of pelvic region: Secondary | ICD-10-CM | POA: Diagnosis not present

## 2024-02-11 DIAGNOSIS — M9902 Segmental and somatic dysfunction of thoracic region: Secondary | ICD-10-CM | POA: Diagnosis not present

## 2024-02-11 DIAGNOSIS — M62830 Muscle spasm of back: Secondary | ICD-10-CM | POA: Diagnosis not present

## 2024-02-13 DIAGNOSIS — M9902 Segmental and somatic dysfunction of thoracic region: Secondary | ICD-10-CM | POA: Diagnosis not present

## 2024-02-13 DIAGNOSIS — M9903 Segmental and somatic dysfunction of lumbar region: Secondary | ICD-10-CM | POA: Diagnosis not present

## 2024-02-13 DIAGNOSIS — M62830 Muscle spasm of back: Secondary | ICD-10-CM | POA: Diagnosis not present

## 2024-02-13 DIAGNOSIS — M9905 Segmental and somatic dysfunction of pelvic region: Secondary | ICD-10-CM | POA: Diagnosis not present

## 2024-02-16 ENCOUNTER — Ambulatory Visit (INDEPENDENT_AMBULATORY_CARE_PROVIDER_SITE_OTHER): Admitting: Internal Medicine

## 2024-02-16 ENCOUNTER — Ambulatory Visit: Payer: Self-pay

## 2024-02-16 ENCOUNTER — Encounter: Payer: Self-pay | Admitting: Internal Medicine

## 2024-02-16 VITALS — BP 114/82 | HR 67 | Temp 98.8°F | Ht 65.0 in | Wt 183.0 lb

## 2024-02-16 DIAGNOSIS — R1032 Left lower quadrant pain: Secondary | ICD-10-CM

## 2024-02-16 MED ORDER — METRONIDAZOLE 500 MG PO TABS: 500.0000 mg | ORAL_TABLET | Freq: Two times a day (BID) | ORAL | 0 refills | Status: AC

## 2024-02-16 MED ORDER — CIPROFLOXACIN HCL 500 MG PO TABS: 500.0000 mg | ORAL_TABLET | Freq: Two times a day (BID) | ORAL | 0 refills | Status: DC

## 2024-02-16 NOTE — Telephone Encounter (Signed)
 Noted--I will check her at today's appointment

## 2024-02-16 NOTE — Progress Notes (Signed)
 Subjective:    Patient ID: [name], female    DOB: 16-Mar-1951, 73 y.o.   MRN: [id number]  HPI Here due to abdominal pain  Has LLQ pain that just won't go away Woke her up this morning Goes back over a month---but then would go away No relationship to eating or moving bowels Bowels have been okay Dull achy pain--like a toothache No fever Did have cipro /metronidazole  for this in May--didn't help  Injured back last week--has gone to chiropractor and it is better  Current Outpatient Medications on File Prior to Visit  Medication Sig Dispense Refill   cholecalciferol (VITAMIN D3) 25 MCG (1000 UNIT) tablet Take 1,000 Units by mouth daily.     Coenzyme Q10 (CO Q 10 PO) Take by mouth.     dicyclomine  (BENTYL ) 20 MG tablet TAKE ONE TABLET BY MOUTH THREE TIMES DAILY BEFORE meals 90 tablet 1   loratadine  (CLARITIN ) 10 MG tablet Take 10 mg by mouth daily.     omega-3 acid ethyl esters (LOVAZA) 1 g capsule Take by mouth 2 (two) times daily.     psyllium (METAMUCIL) 58.6 % packet Take 1 packet by mouth daily.     rosuvastatin  (CRESTOR ) 20 MG tablet TAKE 1 TABLET EVERY DAY 30 tablet 0   verapamil  (CALAN -SR) 240 MG CR tablet TAKE 1 TABLET EVERY DAY 90 tablet 3   No current facility-administered medications on file prior to visit.    Allergies  Allergen Reactions   Morphine Sulfate Swelling   Penicillins Swelling    Past Medical History:  Diagnosis Date   Allergy    Anemia    age 89    Diverticulosis    GERD (gastroesophageal reflux disease)    occasional; OTC as needed   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   Hyperlipidemia    Hypertrophic cardiomyopathy (HCC)    Palpitations    Trigger finger of left hand 09/2012   left long finger    Past Surgical History:  Procedure Laterality Date   BREAST REDUCTION SURGERY Bilateral 2001   BREAST SURGERY  2001   Reduction   CHOLECYSTECTOMY  07/17/2011   Procedure: LAPAROSCOPIC CHOLECYSTECTOMY WITH  INTRAOPERATIVE CHOLANGIOGRAM;  Surgeon: Sherlean JINNY Laughter, MD;  Location: [facility] OR;  Service: General;  Laterality: N/A;   COLONOSCOPY     FOOT GANGLION EXCISION Right    HEMORRHOID SURGERY  1980   LACERATION REPAIR Right 1996   thumb   MYOMECTOMY  10/09/1999   x 2   OOPHORECTOMY Right 1980   OOPHORECTOMY Left 2000   OVARIAN CYST DRAINAGE  1980   PARTIAL HYSTERECTOMY  2002   with no remaining ovary   POLYPECTOMY     TRIGGER FINGER RELEASE Left 09/28/2012   Procedure: RELEASE TRIGGER FINGER/A-1 PULLEY;  Surgeon: Alm DELENA Hummer, MD;  Location: [facility];  Service: Orthopedics;  Laterality: Left;   UPPER GASTROINTESTINAL ENDOSCOPY      Family History  Problem Relation Age of Onset   Colon cancer Father 37   Early death Father    Diabetes Sister    Hepatitis C Brother    Cancer Brother    Hypertension Brother    Hepatitis C Brother    Colon cancer Paternal Aunt        dx'd in her 2's    Breast cancer Paternal Aunt        two aunts   Lung cancer Paternal Uncle    Breast cancer  Cousin    Liver cancer Cousin    Colon cancer Other        cousin    Cancer Maternal Uncle    Cancer Maternal Aunt    Colon polyps Neg Hx    Esophageal cancer Neg Hx    Rectal cancer Neg Hx    Stomach cancer Neg Hx     Social History   Socioeconomic History   Marital status: Divorced    Spouse name: Not on file   Number of children: 2   Years of education: Not on file   Highest education level: Not on file  Occupational History   Occupation: [employer] from home  Tobacco Use   Smoking status: Former    Current packs/day: 0.00    Average packs/day: 2.5 packs/day for 42.0 years (105.0 ttl pk-yrs)    Types: Cigarettes    Start date: 07/08/1963    Quit date: 07/07/2005    Years since quitting: 18.6   Smokeless tobacco: Never   Tobacco comments:    quit smoking 2008  Vaping Use   Vaping status: Never Used  Substance and Sexual Activity   Alcohol use: Not Currently     Comment: rarely   Drug use: Yes    Frequency: 7.0 times per week    Types: Marijuana    Comment: previous hx. of   Sexual activity: Yes    Birth control/protection: Surgical  Other Topics Concern   Not on file  Social History Narrative   Divorced 1982   2 children   Left handed      Retired 2024   Do you live at home alone?yes   Who lives with you?    What type of home do you live in: 1 story or 2 story? two   Caffeine no    Social Drivers of Corporate investment banker Strain: Low Risk  (01/22/2024)   Overall Financial Resource Strain (CARDIA)    Difficulty of Paying Living Expenses: Not hard at all  Food Insecurity: No Food Insecurity (01/22/2024)   Hunger Vital Sign    Worried About Running Out of Food in the Last Year: Never true    Ran Out of Food in the Last Year: Never true  Transportation Needs: No Transportation Needs (01/22/2024)   PRAPARE - Administrator, Civil Service (Medical): No    Lack of Transportation (Non-Medical): No  Physical Activity: Inactive (01/22/2024)   Exercise Vital Sign    Days of Exercise per Week: 0 days    Minutes of Exercise per Session: 0 min  Stress: No Stress Concern Present (01/22/2024)   [employer] of Occupational Health - Occupational Stress Questionnaire    Feeling of Stress: Not at all  Social Connections: Socially Isolated (01/22/2024)   Social Connection and Isolation Panel    Frequency of Communication with Friends and Family: More than three times a week    Frequency of Social Gatherings with Friends and Family: More than three times a week    Attends Religious Services: Never    Database administrator or Organizations: No    Attends Banker Meetings: Never    Marital Status: Divorced  Catering manager Violence: Not At Risk (01/22/2024)   Humiliation, Afraid, Rape, and Kick questionnaire    Fear of Current or Ex-Partner: No    Emotionally Abused: No    Physically Abused: No    Sexually Abused:  No   Review of Systems No dysuria or  hematuria Does go slow when first awakening--then has to go later to fully empty    Objective:   Physical Exam Constitutional:      Appearance: Normal appearance.  Abdominal:     General: There is no distension.     Palpations: Abdomen is soft.     Tenderness: There is no guarding or rebound.     Comments: Significant focal LLQ tenderness  Musculoskeletal:     Comments: No spine or back tenderness  Neurological:     Mental Status: She is alert.     Comments: Normal gait            Assessment & Plan:

## 2024-02-16 NOTE — Assessment & Plan Note (Signed)
 This has been frustrating and chronic--though intermittent Did have CT evidence of diverticulitis a year ago--though better in May No worrisome systemic features and nothing to suggest abscess Recommended standard antibiotics for diverticulitis---she can wait 1-2 days to see if it gets better on its own

## 2024-02-16 NOTE — Telephone Encounter (Signed)
 FYI Only or Action Required?: FYI only for provider.  Patient was last seen in primary care on 12/24/2023 by Jimmy Charlie FERNS, MD.  Called Nurse Triage reporting Pelvic Pain.  Symptoms began about a month ago.  Interventions attempted: Rest, hydration, or home remedies.  Symptoms are: gradually worsening.  Triage Disposition: See Physician Within 24 Hours  Patient/caregiver understands and will follow disposition?: Yes  Copied from CRM #[id number]. Topic: Clinical - Red Word Triage >> Feb 16, 2024  9:24 AM Viola FALCON wrote: Red Word that prompted transfer to Nurse Triage: Pain in the left side off and on for about a month Reason for Disposition  [1] MODERATE (e.g., interferes with normal activities) pelvic pain AND [2] pain comes and goes (cramps) AND [3] present > 24 hours  Answer Assessment - Initial Assessment Questions 1. LOCATION: Where does it hurt?      Pelvis 2. RADIATION: Does the pain shoot anywhere else? (e.g., lower back, groin, thighs)     Lower back 3. ONSET: When did the pain begin? (e.g., minutes, hours or days ago)      One month ago 4. SUDDEN: Gradual or sudden onset?     Did not disclose 5. PATTERN Does the pain come and go, or is it constant?     Intermittently 6. SEVERITY: How bad is the pain?  (e.g., Scale 1-10; mild, moderate, or severe)     Moderate-worsening 7. RECURRENT SYMPTOM: Have you ever had this type of pelvic pain before? If Yes, ask: When was the last time? and What happened that time?      Intermittent X 1 month.  8. CAUSE: What do you think is causing the pelvic pain?     Unknown, no injury 9. RELIEVING/AGGRAVATING FACTORS: What makes it better or worse? (e.g., activity/rest, sexual intercourse, voiding, passing stool)     Did not disclose 10. OTHER SYMPTOMS: Has there been any other symptoms? (e.g., fever, constipation, diarrhea, urine problems, vaginal bleeding, vaginal discharge, or vomiting?       Lower back  pain  Protocols used: Pelvic Pain - Female-A-AH

## 2024-02-18 DIAGNOSIS — M62830 Muscle spasm of back: Secondary | ICD-10-CM | POA: Diagnosis not present

## 2024-02-18 DIAGNOSIS — M9905 Segmental and somatic dysfunction of pelvic region: Secondary | ICD-10-CM | POA: Diagnosis not present

## 2024-02-18 DIAGNOSIS — M9903 Segmental and somatic dysfunction of lumbar region: Secondary | ICD-10-CM | POA: Diagnosis not present

## 2024-02-18 DIAGNOSIS — M9902 Segmental and somatic dysfunction of thoracic region: Secondary | ICD-10-CM | POA: Diagnosis not present

## 2024-02-20 DIAGNOSIS — M9903 Segmental and somatic dysfunction of lumbar region: Secondary | ICD-10-CM | POA: Diagnosis not present

## 2024-02-20 DIAGNOSIS — M9902 Segmental and somatic dysfunction of thoracic region: Secondary | ICD-10-CM | POA: Diagnosis not present

## 2024-02-20 DIAGNOSIS — M62830 Muscle spasm of back: Secondary | ICD-10-CM | POA: Diagnosis not present

## 2024-02-20 DIAGNOSIS — M542 Cervicalgia: Secondary | ICD-10-CM | POA: Diagnosis not present

## 2024-02-20 DIAGNOSIS — M9905 Segmental and somatic dysfunction of pelvic region: Secondary | ICD-10-CM | POA: Diagnosis not present

## 2024-02-20 DIAGNOSIS — M9901 Segmental and somatic dysfunction of cervical region: Secondary | ICD-10-CM | POA: Diagnosis not present

## 2024-02-25 DIAGNOSIS — M62830 Muscle spasm of back: Secondary | ICD-10-CM | POA: Diagnosis not present

## 2024-02-25 DIAGNOSIS — M9903 Segmental and somatic dysfunction of lumbar region: Secondary | ICD-10-CM | POA: Diagnosis not present

## 2024-02-25 DIAGNOSIS — M9905 Segmental and somatic dysfunction of pelvic region: Secondary | ICD-10-CM | POA: Diagnosis not present

## 2024-02-25 DIAGNOSIS — M9902 Segmental and somatic dysfunction of thoracic region: Secondary | ICD-10-CM | POA: Diagnosis not present

## 2024-03-02 ENCOUNTER — Other Ambulatory Visit: Payer: Self-pay | Admitting: Cardiology

## 2024-03-02 DIAGNOSIS — E785 Hyperlipidemia, unspecified: Secondary | ICD-10-CM

## 2024-03-03 DIAGNOSIS — M9903 Segmental and somatic dysfunction of lumbar region: Secondary | ICD-10-CM | POA: Diagnosis not present

## 2024-03-03 DIAGNOSIS — M9902 Segmental and somatic dysfunction of thoracic region: Secondary | ICD-10-CM | POA: Diagnosis not present

## 2024-03-03 DIAGNOSIS — M9905 Segmental and somatic dysfunction of pelvic region: Secondary | ICD-10-CM | POA: Diagnosis not present

## 2024-03-03 DIAGNOSIS — M62830 Muscle spasm of back: Secondary | ICD-10-CM | POA: Diagnosis not present

## 2024-03-04 ENCOUNTER — Ambulatory Visit: Admitting: Cardiology

## 2024-04-06 ENCOUNTER — Other Ambulatory Visit: Payer: Self-pay | Admitting: Cardiology

## 2024-04-06 DIAGNOSIS — E785 Hyperlipidemia, unspecified: Secondary | ICD-10-CM

## 2024-05-02 DIAGNOSIS — R931 Abnormal findings on diagnostic imaging of heart and coronary circulation: Secondary | ICD-10-CM | POA: Insufficient documentation

## 2024-05-02 DIAGNOSIS — R9431 Abnormal electrocardiogram [ECG] [EKG]: Secondary | ICD-10-CM | POA: Insufficient documentation

## 2024-05-02 NOTE — Progress Notes (Unsigned)
 Cardiology Office Note:   Date:  05/03/2024  ID:  [name], DOB [birth year], MRN [id number] PCP: Cleatus Arlyss RAMAN, MD  [facility] Providers Cardiologist:  Lynwood Schilling, MD {  History of Present Illness:   [name] is a 73 y.o. female who presents for evaluation of mild ventricular hypertrophy. She's also had palpitations. An echo in 2010 suggested moderate mitral regurgitation directed posteriorly. There was LVH. This was severe. She is also had some aortic insufficiency. However followup echocardiography in 2013 suggested only mild mitral regurgitation and no significant ventricular hypertrophy or aortic insufficiency.    Her echo in Nov 2023 demonstrated mild LVH with a mild intracavitary gradient.       Since I last saw her she retired.  She is not being physically active.  With her level of physical activity the patient denies any new symptoms such as chest discomfort, neck or arm discomfort. There has been no new shortness of breath, PND or orthopnea. There has been no presyncope or syncope.  She does have palpitations mostly at night or at rest.  She says these to become a little more problematic than they were before.  She feels some skipping though she has not had any presyncope or syncope.  She cannot bring these on.  ROS: As stated in the HPI and negative for all other systems.  Studies Reviewed:    EKG:   EKG Interpretation Date/Time:  Monday May 03 2024 08:16:20 EDT Ventricular Rate:  71 PR Interval:  180 QRS Duration:  86 QT Interval:  378 QTC Calculation: 410 R Axis:   33  Text Interpretation: Normal sinus rhythm with sinus arrhythmia T wave abnormality, consider anterolateral ischemia When compared with ECG of 30-Jan-2023 00:36, No significant change since last tracing Confirmed by Schilling Lynwood ([id number]) on 05/03/2024 8:21:16 AM     Risk Assessment/Calculations:              Physical Exam:   VS:  BP 124/68 (BP Location: Left Arm,  Patient Position: Sitting, Cuff Size: Normal)   Pulse 71   Ht 5' 5 (1.651 m)   Wt 189 lb 4.8 oz (85.9 kg)   SpO2 99%   BMI 31.50 kg/m    Wt Readings from Last 3 Encounters:  05/03/24 189 lb 4.8 oz (85.9 kg)  02/16/24 183 lb (83 kg)  01/22/24 182 lb (82.6 kg)     GEN: Well nourished, well developed in no acute distress NECK: No JVD; No carotid bruits CARDIAC: RRR, no murmurs, rubs, gallops RESPIRATORY:  Clear to auscultation without rales, wheezing or rhonchi  ABDOMEN: Soft, non-tender, non-distended EXTREMITIES:  No edema; No deformity   ASSESSMENT AND PLAN:   DIZZINESS:   I will have her wear a 4-week monitor to make sure she has no arrhythmias other than premature beats.    HTN:   Blood pressures is at target.  No change in therapy.   PALPITATIONS:   I will have her wear a 4-week monitor to assess.    DYSLIPIDEMIA:   Her last LDL 70.  No change in therapy.   AORTIC ATHEROSCLEROSIS:   She continues with risk reduction.  LVH:    She has had some mild LVH and no symptoms.  Her son has been diagnosed with hypertrophic cardiomyopathy I asked him to check about his genetic testing as it is necessary screening for her other son and her grandchildren.   Elevated coronary calcium : She has had no symptoms.  She needs  risk reduction.  No change in therapy.  Continue current statin.      Follow up with me in 1 year or sooner if needed.  Signed, Lynwood Schilling, MD

## 2024-05-03 ENCOUNTER — Ambulatory Visit: Attending: Cardiology | Admitting: Cardiology

## 2024-05-03 ENCOUNTER — Encounter: Payer: Self-pay | Admitting: Cardiology

## 2024-05-03 VITALS — BP 124/68 | HR 71 | Ht 65.0 in | Wt 189.3 lb

## 2024-05-03 DIAGNOSIS — R002 Palpitations: Secondary | ICD-10-CM

## 2024-05-03 DIAGNOSIS — I700 Atherosclerosis of aorta: Secondary | ICD-10-CM | POA: Diagnosis not present

## 2024-05-03 DIAGNOSIS — I10 Essential (primary) hypertension: Secondary | ICD-10-CM | POA: Diagnosis not present

## 2024-05-03 DIAGNOSIS — R9431 Abnormal electrocardiogram [ECG] [EKG]: Secondary | ICD-10-CM

## 2024-05-03 DIAGNOSIS — R931 Abnormal findings on diagnostic imaging of heart and coronary circulation: Secondary | ICD-10-CM

## 2024-05-03 DIAGNOSIS — E785 Hyperlipidemia, unspecified: Secondary | ICD-10-CM

## 2024-05-03 NOTE — Patient Instructions (Signed)
 Medication Instructions:  Your physician recommends that you continue on your current medications as directed. Please refer to the Current Medication list given to you today.  *If you need a refill on your cardiac medications before your next appointment, please call your pharmacy*  Lab Work: NONE If you have labs (blood work) drawn today and your tests are completely normal, you will receive your results only by: MyChart Message (if you have MyChart) OR A paper copy in the mail If you have any lab test that is abnormal or we need to change your treatment, we will call you to review the results.  Testing/Procedures: 4 week Event Monitor  Follow-Up: At [facility], you and your health needs are our priority.  As part of our continuing mission to provide you with exceptional heart care, our providers are all part of one team.  This team includes your primary Cardiologist (physician) and Advanced Practice Providers or APPs (Physician Assistants and Nurse Practitioners) who all work together to provide you with the care you need, when you need it.  Your next appointment:   1 year  Provider:   Lavona, MD  We recommend signing up for the patient portal called MyChart.  Sign up information is provided on this After Visit Summary.  MyChart is used to connect with patients for Virtual Visits (Telemedicine).  Patients are able to view lab/test results, encounter notes, upcoming appointments, etc.  Non-urgent messages can be sent to your provider as well.   To learn more about what you can do with MyChart, go to [email or web address].

## 2024-05-05 ENCOUNTER — Telehealth: Payer: Self-pay | Admitting: Cardiology

## 2024-05-05 DIAGNOSIS — E785 Hyperlipidemia, unspecified: Secondary | ICD-10-CM

## 2024-05-05 MED ORDER — ROSUVASTATIN CALCIUM 20 MG PO TABS: 20.0000 mg | ORAL_TABLET | Freq: Every day | ORAL | 3 refills | Status: AC

## 2024-05-05 NOTE — Telephone Encounter (Signed)
*  STAT* If patient is at the pharmacy, call can be transferred to refill team.   1. Which medications need to be refilled? (please list name of each medication and dose if known) rosuvastatin (CRESTOR) 20 MG tablet    2. Would you like to learn more about the convenience, safety, & potential cost savings by using the [facility]? No   3. Are you open to using the [facility] (Type [facility].  ) No   4. Which pharmacy/location (including street and city if local pharmacy) is medication to be sent to?  [facility] - [place], [place] - [address]     5. Do they need a 30 day or 90 day supply? 90 day

## 2024-05-05 NOTE — Telephone Encounter (Signed)
 Pt just saw Dr. Lavona 05/03/28.  Year supply Rosuvastatin  has been sent to [facility], per pt's request.

## 2024-05-08 DIAGNOSIS — R002 Palpitations: Secondary | ICD-10-CM | POA: Diagnosis not present

## 2024-05-20 ENCOUNTER — Ambulatory Visit: Admitting: Cardiology

## 2024-06-07 ENCOUNTER — Other Ambulatory Visit: Payer: Self-pay | Admitting: Family Medicine

## 2024-06-09 ENCOUNTER — Telehealth: Payer: Self-pay | Admitting: Family Medicine

## 2024-06-09 NOTE — Telephone Encounter (Unsigned)
 Copied from CRM [id number]. Topic: Clinical - Medication Refill >> Jun 09, 2024  1:28 PM Ashley R wrote: Medication: Pending Dicyclomine  HCl 20 MG   Has the patient contacted their pharmacy? Yes, requested 12/1  This is the patient's preferred pharmacy:   [facility] - [place], [place] - [address] [place] [address] Phone: [phone] Fax: [phone]  Is this the correct pharmacy for this prescription? Yes If no, delete pharmacy and type the correct one.   Has the prescription been filled recently? Yes  Is the patient out of the medication? Yes  Has the patient been seen for an appointment in the last year OR does the patient have an upcoming appointment? Yes  Can we respond through MyChart? Yes  Agent: Please be advised that Rx refills may take up to 3 business days. We ask that you follow-up with your pharmacy.

## 2024-06-10 ENCOUNTER — Ambulatory Visit: Attending: Cardiology

## 2024-06-10 DIAGNOSIS — R002 Palpitations: Secondary | ICD-10-CM

## 2024-06-10 NOTE — Telephone Encounter (Signed)
 There is already a pending refill encounter open on this.

## 2024-06-13 ENCOUNTER — Ambulatory Visit: Payer: Self-pay | Admitting: Cardiology

## 2024-06-13 DIAGNOSIS — R002 Palpitations: Secondary | ICD-10-CM

## 2024-06-24 ENCOUNTER — Other Ambulatory Visit (HOSPITAL_COMMUNITY): Payer: Self-pay | Admitting: Family Medicine

## 2024-06-24 DIAGNOSIS — Z1231 Encounter for screening mammogram for malignant neoplasm of breast: Secondary | ICD-10-CM

## 2024-07-12 ENCOUNTER — Encounter (HOSPITAL_COMMUNITY): Payer: Self-pay

## 2024-07-12 ENCOUNTER — Ambulatory Visit (HOSPITAL_COMMUNITY)
Admission: RE | Admit: 2024-07-12 | Discharge: 2024-07-12 | Disposition: A | Discharge status: Home / Self Care | Source: Ambulatory Visit | Attending: Family Medicine | Admitting: Family Medicine

## 2024-07-12 DIAGNOSIS — Z1231 Encounter for screening mammogram for malignant neoplasm of breast: Secondary | ICD-10-CM | POA: Diagnosis present

## 2024-07-18 ENCOUNTER — Ambulatory Visit: Payer: Self-pay | Admitting: Family Medicine

## 2024-08-02 ENCOUNTER — Ambulatory Visit: Admitting: Pulmonary Disease

## 2024-08-05 ENCOUNTER — Encounter: Payer: Self-pay | Admitting: Family Medicine

## 2024-08-05 ENCOUNTER — Ambulatory Visit: Admitting: Family Medicine

## 2024-08-05 VITALS — BP 118/80 | HR 66 | Temp 98.0°F | Ht 65.0 in | Wt 188.0 lb

## 2024-08-05 DIAGNOSIS — R413 Other amnesia: Secondary | ICD-10-CM

## 2024-08-05 NOTE — Progress Notes (Signed)
 Memory changes noted over the last 2 months.  Was having a normal conversation with son and she lost her train of thought.  Harder to bring a memory back during events.  3 events over the last few months.  Two of the times she was able to get the conversation back on track. No focal neuro sx during the events, no facial droop, no change in sensation or motor function.    Some floaters in the L eye, with eye clinic f/u pending.    No trouble cooking, driving, balancing her check book.  Occ feeling of imbalance w/o falling, noted with trunk rotation while standing.    MJ d/w pt.  <1 per day.  D/w pt about taper and cessation.   Meds, vitals, and allergies reviewed.   ROS: Per HPI unless specifically indicated in ROS section   GEN: nad, alert and oriented HEENT: mucous membranes moist NECK: supple w/o LA CV: rrr. PULM: ctab, no inc wob ABD: soft, +bs EXT: no edema SKIN: well perfused.   MMSE 29/30.  -1 recall.

## 2024-08-05 NOTE — Patient Instructions (Addendum)
 We'll update you about your labs.  Take care.  Glad to see you. Let me know if you have more concerns.

## 2024-08-06 LAB — CBC WITH DIFFERENTIAL/PLATELET
Basophils Absolute: 0.1 10*3/uL (ref 0.0–0.1)
Basophils Relative: 1.4 % (ref 0.0–3.0)
Eosinophils Absolute: 0.2 10*3/uL (ref 0.0–0.7)
Eosinophils Relative: 4 % (ref 0.0–5.0)
HCT: 39.3 % (ref 36.0–46.0)
Hemoglobin: 13 g/dL (ref 12.0–15.0)
Lymphocytes Relative: 32.7 % (ref 12.0–46.0)
Lymphs Abs: 1.3 10*3/uL (ref 0.7–4.0)
MCHC: 33.1 g/dL (ref 30.0–36.0)
MCV: 82.7 fl (ref 78.0–100.0)
Monocytes Absolute: 0.4 10*3/uL (ref 0.1–1.0)
Monocytes Relative: 10.3 % (ref 3.0–12.0)
Neutro Abs: 2.1 10*3/uL (ref 1.4–7.7)
Neutrophils Relative %: 51.6 % (ref 43.0–77.0)
Platelets: 199 10*3/uL (ref 150.0–400.0)
RBC: 4.75 Mil/uL (ref 3.87–5.11)
RDW: 13.4 % (ref 11.5–15.5)
WBC: 4.1 10*3/uL (ref 4.0–10.5)

## 2024-08-06 LAB — COMPREHENSIVE METABOLIC PANEL WITH GFR
ALT: 17 U/L (ref 3–35)
AST: 21 U/L (ref 5–37)
Albumin: 4.4 g/dL (ref 3.5–5.2)
Alkaline Phosphatase: 59 U/L (ref 39–117)
BUN: 8 mg/dL (ref 6–23)
CO2: 29 meq/L (ref 19–32)
Calcium: 10 mg/dL (ref 8.4–10.5)
Chloride: 104 meq/L (ref 96–112)
Creatinine, Ser: 0.7 mg/dL (ref 0.40–1.20)
GFR: 84.62 mL/min
Glucose, Bld: 81 mg/dL (ref 70–99)
Potassium: 4.2 meq/L (ref 3.5–5.1)
Sodium: 139 meq/L (ref 135–145)
Total Bilirubin: 0.4 mg/dL (ref 0.2–1.2)
Total Protein: 7.8 g/dL (ref 6.0–8.3)

## 2024-08-06 LAB — TSH: TSH: 1.31 u[IU]/mL (ref 0.35–5.50)

## 2024-08-06 LAB — VITAMIN B12: Vitamin B-12: 429 pg/mL (ref 211–911)

## 2024-08-08 DIAGNOSIS — R413 Other amnesia: Secondary | ICD-10-CM | POA: Insufficient documentation

## 2024-08-08 NOTE — Assessment & Plan Note (Signed)
 MMSE 29/30.  -1 recall.   O/w normal testing.   MJ d/w pt.  <1 per day.  D/w pt about taper and cessation.   See notes on labs.   Update me as needed.  Defer imaging at this point with MRI 2024.  IMPRESSION: No acute intracranial process.  She agrees with plan.

## 2024-08-09 ENCOUNTER — Ambulatory Visit: Payer: Self-pay | Admitting: Family Medicine

## 2024-08-16 ENCOUNTER — Ambulatory Visit: Admitting: Pulmonary Disease

## 2025-01-24 ENCOUNTER — Ambulatory Visit
